# Patient Record
Sex: Female | Born: 1990 | Race: White | Hispanic: No | Marital: Married | Smoking: Former smoker
Health system: Southern US, Community
[De-identification: ages and names within clinical notes are randomized; demographics above are authoritative.]

## PROBLEM LIST (undated history)

## (undated) DIAGNOSIS — K589 Irritable bowel syndrome without diarrhea: Secondary | ICD-10-CM

## (undated) DIAGNOSIS — F329 Major depressive disorder, single episode, unspecified: Secondary | ICD-10-CM

## (undated) DIAGNOSIS — O1490 Unspecified pre-eclampsia, unspecified trimester: Secondary | ICD-10-CM

## (undated) DIAGNOSIS — F419 Anxiety disorder, unspecified: Secondary | ICD-10-CM

## (undated) DIAGNOSIS — G600 Hereditary motor and sensory neuropathy: Secondary | ICD-10-CM

## (undated) DIAGNOSIS — N809 Endometriosis, unspecified: Secondary | ICD-10-CM

## (undated) DIAGNOSIS — G43909 Migraine, unspecified, not intractable, without status migrainosus: Secondary | ICD-10-CM

## (undated) HISTORY — PX: WISDOM TOOTH EXTRACTION: SHX21

## (undated) HISTORY — DX: Endometriosis, unspecified: N80.9

## (undated) HISTORY — PX: CHOLECYSTECTOMY: SHX55

---
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
## 2012-03-05 ENCOUNTER — Emergency Department: Payer: Self-pay | Admitting: Internal Medicine

## 2012-11-30 ENCOUNTER — Emergency Department: Payer: Self-pay | Admitting: Emergency Medicine

## 2012-11-30 LAB — URINALYSIS, COMPLETE
Bilirubin,UR: NEGATIVE
Glucose,UR: NEGATIVE mg/dL (ref 0–75)
Nitrite: NEGATIVE
Ph: 5 (ref 4.5–8.0)
Protein: 30
RBC,UR: 1 /HPF (ref 0–5)
Squamous Epithelial: 2

## 2012-11-30 LAB — COMPREHENSIVE METABOLIC PANEL
Albumin: 4.2 g/dL (ref 3.8–5.6)
Alkaline Phosphatase: 71 U/L — ABNORMAL LOW (ref 82–169)
Anion Gap: 11 (ref 7–16)
BUN: 12 mg/dL (ref 7–18)
Bilirubin,Total: 2.5 mg/dL — ABNORMAL HIGH (ref 0.2–1.0)
Calcium, Total: 9 mg/dL (ref 9.0–10.7)
Chloride: 110 mmol/L — ABNORMAL HIGH (ref 98–107)
Creatinine: 0.56 mg/dL — ABNORMAL LOW (ref 0.60–1.30)
Glucose: 111 mg/dL — ABNORMAL HIGH (ref 65–99)
Osmolality: 280 (ref 275–301)
Potassium: 3.6 mmol/L (ref 3.5–5.1)
SGOT(AST): 26 U/L (ref 0–26)
SGPT (ALT): 34 U/L (ref 12–78)
Sodium: 140 mmol/L (ref 136–145)
Total Protein: 8 g/dL (ref 6.4–8.6)

## 2012-11-30 LAB — LIPASE, BLOOD: Lipase: 160 U/L (ref 73–393)

## 2012-11-30 LAB — CBC
HCT: 42.9 % (ref 35.0–47.0)
MCH: 29.6 pg (ref 26.0–34.0)
MCHC: 34.1 g/dL (ref 32.0–36.0)
Platelet: 221 10*3/uL (ref 150–440)

## 2012-12-08 ENCOUNTER — Ambulatory Visit: Payer: Self-pay | Admitting: Physician Assistant

## 2013-02-02 ENCOUNTER — Ambulatory Visit: Payer: Self-pay | Admitting: Unknown Physician Specialty

## 2013-04-04 ENCOUNTER — Encounter: Payer: Self-pay | Admitting: Neurology

## 2013-04-30 ENCOUNTER — Encounter: Payer: Self-pay | Admitting: Neurology

## 2013-05-30 ENCOUNTER — Encounter: Payer: Self-pay | Admitting: Neurology

## 2013-11-09 ENCOUNTER — Ambulatory Visit: Payer: Self-pay | Admitting: Physician Assistant

## 2014-04-24 IMAGING — CT CT ABD-PELV W/ CM
1 of 2 series · 15 of 32 positions shown, 19 images · IV contrast (isovue)
Comparison: None

REASON FOR EXAM: (1) generalized abdominal pain, vomiting; (2)
generalized abdominal pain, vomiti
COMMENTS:

PROCEDURE:     CT  - CT ABDOMEN / PELVIS  W  - November 30, 2012 [DATE]
RESULT:     History: Generalized abdominal pain
TECHNIQUE: Multiple axial images of the abdomen and pelvis were performed
from the lung bases to the pubic symphysis, without p.o. contrast and with
85 ml of Isovue 300 intravenous contrast.

[Series 2: 3mm soft tissue · axial · 0.58mm/px · z∈[-1156,-754]mm · 15 of 146 slices shown, 19 images]
[im 6/146  soft-tissue]
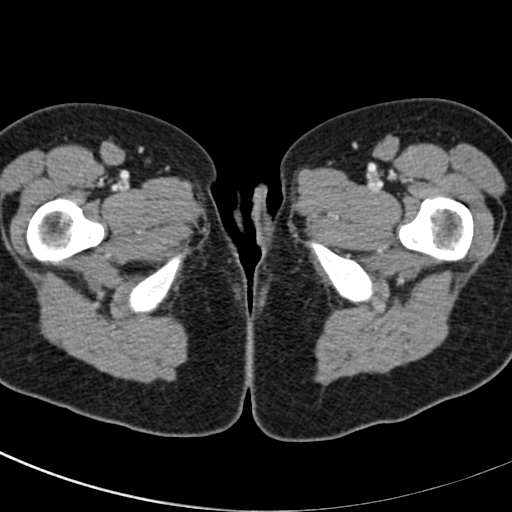
[im 6/146  bone]
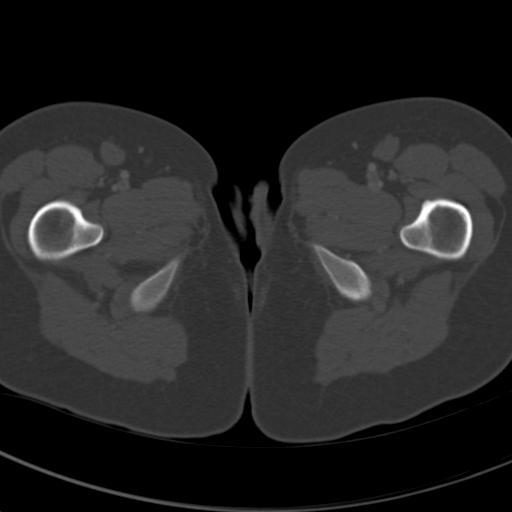
[im 18/146  soft-tissue]
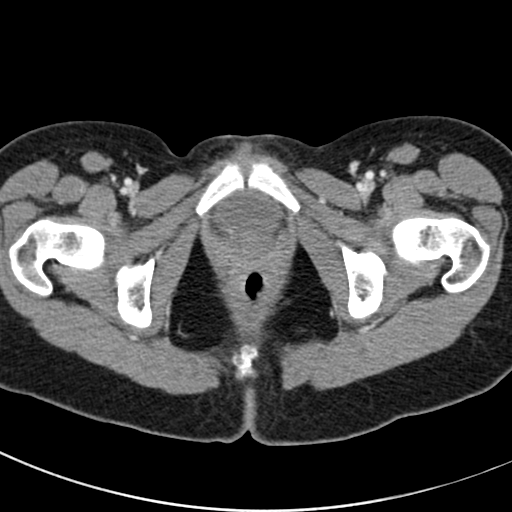
[im 30/146  soft-tissue]
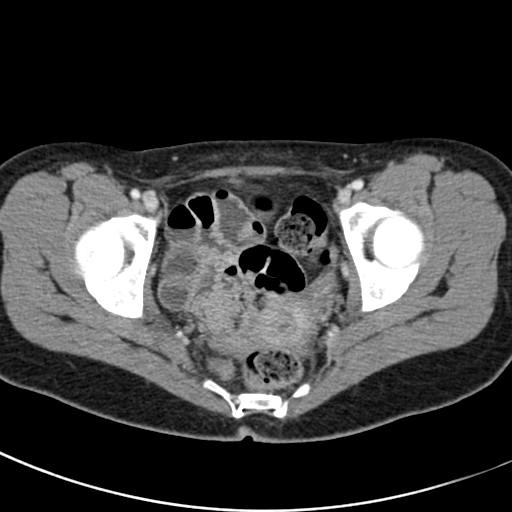
[im 41/146  soft-tissue]
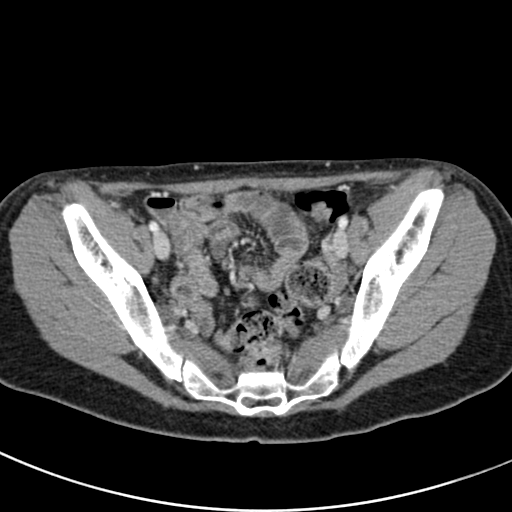
[im 53/146  soft-tissue]
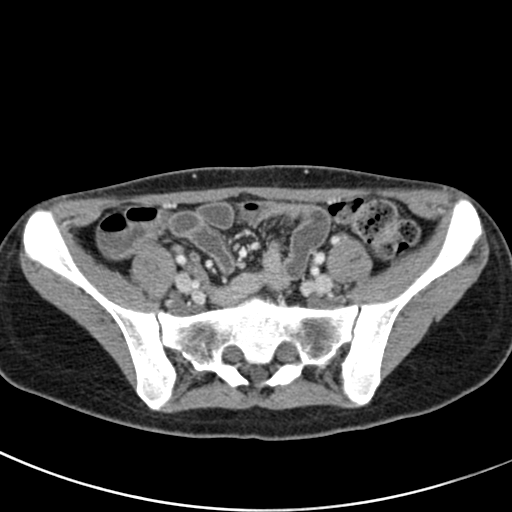
[im 64/146  soft-tissue]
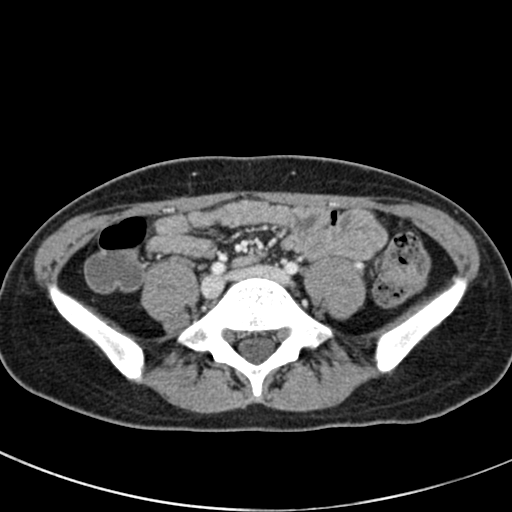
[im 76/146  soft-tissue]
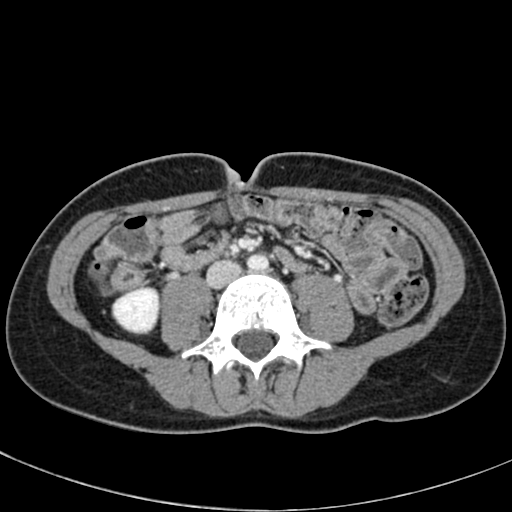
[im 82/146  soft-tissue]
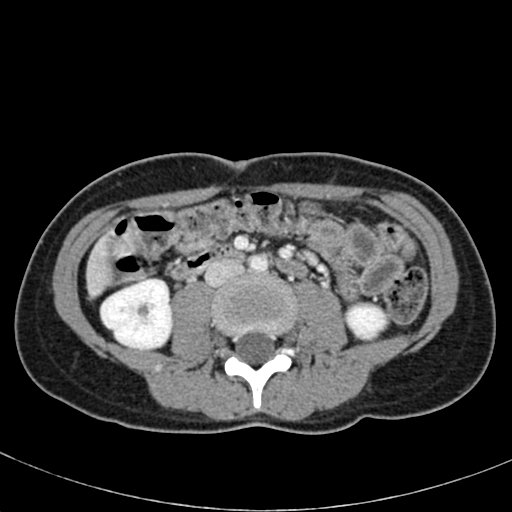
[im 93/146  soft-tissue]
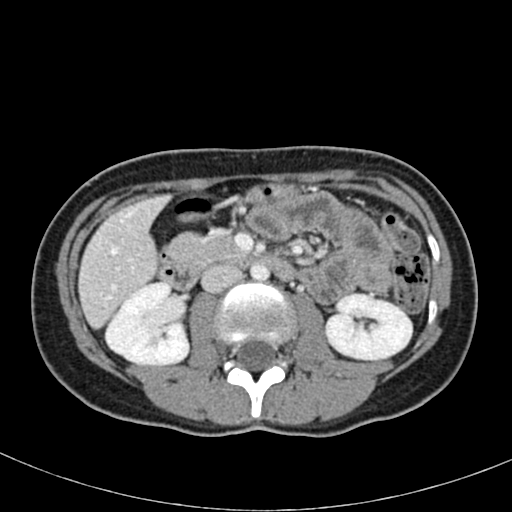
[im 93/146  bone]
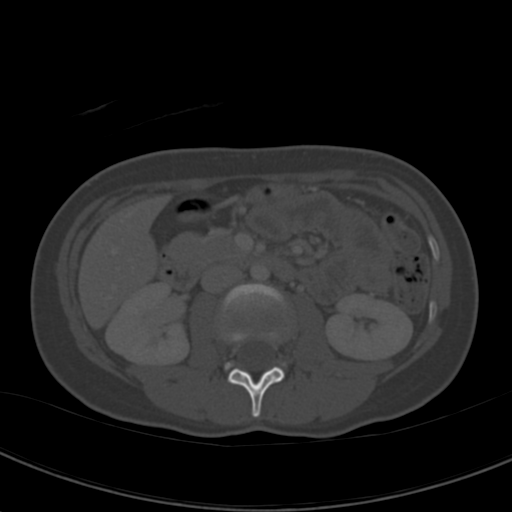
[im 105/146  soft-tissue]
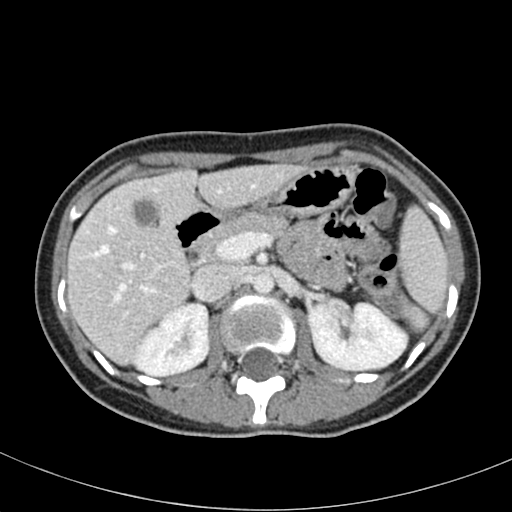
[im 117/146  soft-tissue]
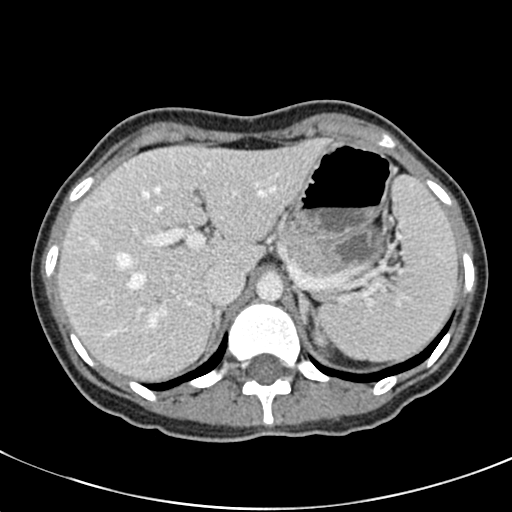
[im 122/146  lung]
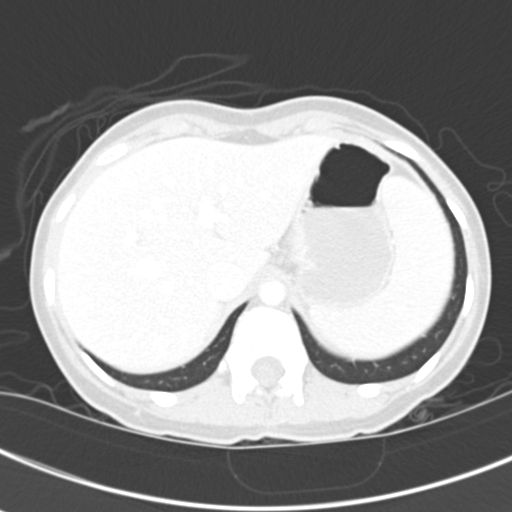
[im 128/146  soft-tissue]
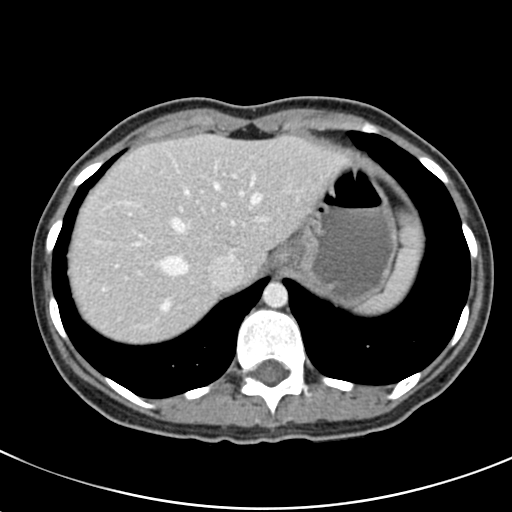
[im 128/146  lung]
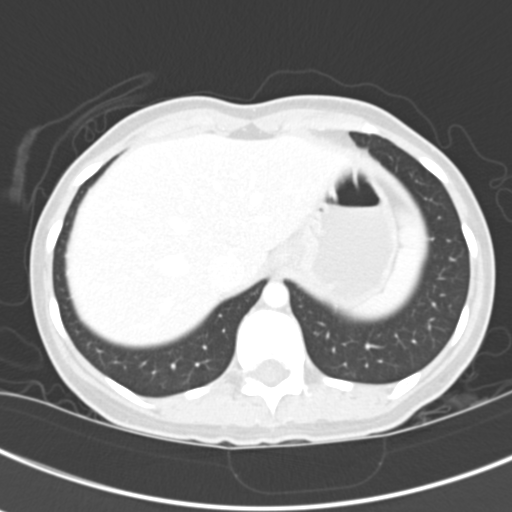
[im 134/146  lung]
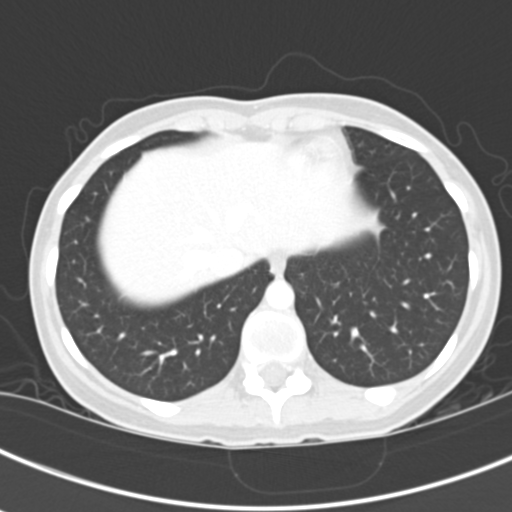
[im 140/146  soft-tissue]
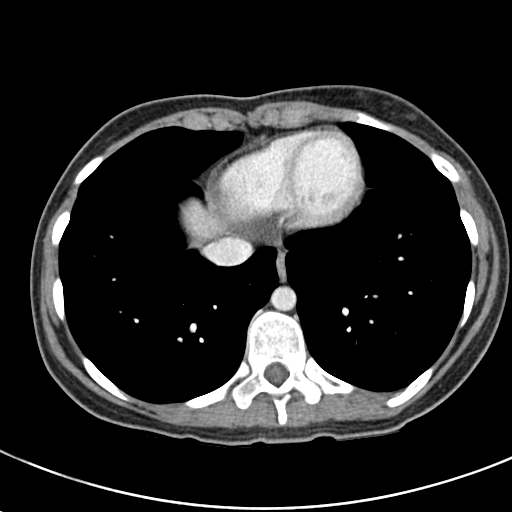
[im 140/146  lung]
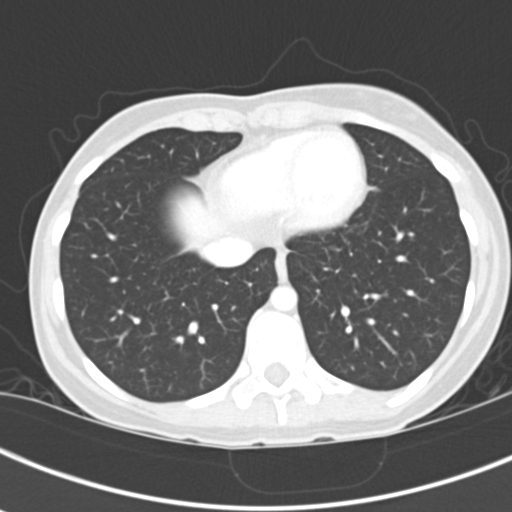

[15 of 32 positions shown; findings below may reference images not displayed]

FINDINGS: The lung bases are clear. There is no pneumothorax. The heart size is
normal.

The liver demonstrates no focal abnormality. There is no intrahepatic or
extrahepatic biliary ductal dilatation. The gallbladder is unremarkable. The
spleen demonstrates no focal abnormality. The kidneys, adrenal glands, and
pancreas are normal. The bladder is unremarkable.

The stomach, duodenum, small intestine, and large intestine demonstrate no
contrast extravasation or dilatation. There is a normal caliber appendix in
the right lower quadrant without periappendiceal inflammatory changes. There
is no pneumoperitoneum, pneumatosis, or portal venous gas. There is no
abdominal or pelvic free fluid. There is no lymphadenopathy.

The abdominal aorta is normal in caliber .

The osseous structures are unremarkable.
IMPRESSION: 1. No acute abdominal or pelvic pathology.

[REDACTED]

## 2014-05-29 DIAGNOSIS — G600 Hereditary motor and sensory neuropathy: Secondary | ICD-10-CM | POA: Insufficient documentation

## 2015-01-07 NOTE — H&P (Signed)
L&D Evaluation:  History Expanded:   HPI 24 yo G2P0010 WF at 6836 6/7 weeks by estimated date of confinement 01/05/12 as evidenced by last menstual period and Ultrasound, with contraction pain and headache.  Patient had elevated BP on admission as well as elevated urine protein level, with subsequent lab work consistent with preclampsia. Patient was late entry to care [facility]    Gravida 2    Term 0    PreTerm 0    Abortion 1    Living 0    Blood Type O positive    Group B Strep Results (Result >5wks must be treated as unknown) unknown/result > 5 weeks ago    Maternal HIV Negative    Maternal Syphilis Ab Nonreactive    Maternal Varicella Immune    Rubella Results immune    Presents with contractions    Patient's Medical History No Chronic Illness    Patient's Surgical History none    Medications Pre Natal Vitamins    Allergies NKDA    Social History none    Family History Non-Contributory   ROS:   ROS All systems were reviewed.  HEENT, CNS, GI, GU, Respiratory, CV, Renal and Musculoskeletal systems were found to be normal.   Exam:   Vital Signs 130s/80s    Urine Protein 3+    General no apparent distress    Mental Status clear    Chest clear    Heart normal sinus rhythm    Abdomen gravid, tender with contractions    Estimated Fetal Weight Average for gestational age    Back no CVAT    Edema no edema    Reflexes 1+    Pelvic no external lesions, 1/80/-1    Mebranes Intact    FHT normal rate with no decels    Ucx irregular    Skin dry    Other U Prot/Cr ratio >5000. Blood work normal, Hgb 14, Plt 199, uric acid 4.0, SGOT 25.   Impression:   Impression Severe Preclampsia, proteinuria and headache   Plan:   Plan Induction of labor    Comments Preclampsia precautions Pitocin Analgesia Risks of prematurity at 36+ weeks vs risks of preclampsia discussed, rationale for induction. MgSO4 due to severe preclampsia   Electronic  Signatures: Letitia LibraHarris, Skyler Dusing Paul (MD)  (Signed 21-Apr-13 02:42)  Authored: L&D Evaluation   Last Updated: 21-Apr-13 02:42 by Letitia LibraHarris, Madiha Bambrick Paul (MD)

## 2015-04-27 ENCOUNTER — Ambulatory Visit
Admission: EM | Admit: 2015-04-27 | Discharge: 2015-04-27 | Disposition: A | Discharge status: Home / Self Care | Payer: Medicaid Other | Attending: Family Medicine | Admitting: Family Medicine

## 2015-04-27 DIAGNOSIS — F17210 Nicotine dependence, cigarettes, uncomplicated: Secondary | ICD-10-CM | POA: Diagnosis not present

## 2015-04-27 DIAGNOSIS — J029 Acute pharyngitis, unspecified: Secondary | ICD-10-CM | POA: Diagnosis not present

## 2015-04-27 HISTORY — DX: Hereditary motor and sensory neuropathy: G60.0

## 2015-04-27 LAB — RAPID STREP SCREEN (MED CTR MEBANE ONLY): Streptococcus, Group A Screen (Direct): NEGATIVE

## 2015-04-27 NOTE — ED Notes (Signed)
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 Patient states cannot wait any longer. Left without seeing provider

## 2015-04-27 NOTE — ED Notes (Signed)
Woke this morning with sore throat."My tongue feels swollen". Now c/o slight headache

## 2015-04-29 LAB — CULTURE, GROUP A STREP (THRC)

## 2015-05-27 NOTE — ED Provider Notes (Signed)
CSN: [id number]     Arrival date & time 04/27/15  1130 History   First MD Initiated Contact with Patient 04/27/15 1226     Chief Complaint  Patient presents with  . Sore Throat   (Consider location/radiation/quality/duration/timing/severity/associated sxs/prior Treatment) HPI Comments: 24 yo female with sore throat since this morning. States tongue feels swollen. Denies any fevers, chills, difficulty breathing or swallowing.  Patient is a 24 y.o. female presenting with pharyngitis. The history is provided by the patient.  Sore Throat    Past Medical History  Diagnosis Date  . Charcot-Marie disease    History reviewed. No pertinent past surgical history. Family History  Problem Relation Age of Onset  . Charcot-Marie-Tooth disease Father   . Charcot-Marie-Tooth disease Sister    Social History  Substance Use Topics  . Smoking status: Current Every Day Smoker -- 0.50 packs/day    Types: Cigarettes  . Smokeless tobacco: None  . Alcohol Use: No   OB History    No data available     Review of Systems  Allergies  Sulfa antibiotics  Home Medications   Prior to Admission medications   Medication Sig Start Date End Date Taking? Authorizing Provider  etonogestrel (NEXPLANON) 68 MG IMPL implant 1 each by Subdermal route once.   Yes Historical Provider, MD   Meds Ordered and Administered this Visit  Medications - No data to display  BP 103/68 mmHg  Pulse 72  Temp(Src) 98.5 F (36.9 C) (Oral)  Resp 16  Ht  (1.6 m)  Wt 135 lb (61.236 kg)  BMI 23.92 kg/m2  SpO2 100%  LMP 02/12/2015 (Approximate) No data found.   Physical Exam  Constitutional: She appears well-developed and well-nourished. No distress.  HENT:  Head: Normocephalic and atraumatic.  Right Ear: Tympanic membrane, external ear and ear canal normal.  Left Ear: Tympanic membrane, external ear and ear canal normal.  Nose: Rhinorrhea present. No nose lacerations, sinus tenderness, nasal deformity,  septal deviation or nasal septal hematoma. No epistaxis.  No foreign bodies.  Mouth/Throat: Uvula is midline and mucous membranes are normal. Posterior oropharyngeal erythema present. No oropharyngeal exudate, posterior oropharyngeal edema or tonsillar abscesses.  Tongue normal; no edema  Eyes: Conjunctivae and EOM are normal. Pupils are equal, round, and reactive to light. Right eye exhibits no discharge. Left eye exhibits no discharge. No scleral icterus.  Neck: Normal range of motion. Neck supple. No thyromegaly present.  Cardiovascular: Normal rate, regular rhythm and normal heart sounds.   Pulmonary/Chest: Effort normal and breath sounds normal. No respiratory distress. She has no wheezes. She has no rales.  Lymphadenopathy:    She has no cervical adenopathy.  Skin: She is not diaphoretic.  Nursing note and vitals reviewed.   ED Course  Procedures (including critical care time)  Labs Review Labs Reviewed  RAPID STREP SCREEN (NOT AT [facility])  CULTURE, GROUP A STREP ([facility] ONLY)    Imaging Review No results found.   Visual Acuity Review  Right Eye Distance:   Left Eye Distance:   Bilateral Distance:    Right Eye Near:   Left Eye Near:    Bilateral Near:         MDM   1. Pharyngitis   (likely viral)  Plan: 1. Test (negative rapid strep)  results and diagnosis reviewed with patient 2. rx as per orders; risks, benefits, potential side effects reviewed with patient 3. Recommend supportive treatment with otc analgesics, salt water gargles 4. F/u prn if symptoms worsen or  don't improve  Discharge Medication List as of 04/27/2015 12:45 PM        Autumn Mccallum, MD 05/27/15 1005

## 2015-11-20 ENCOUNTER — Inpatient Hospital Stay: Admission: RE | Admit: 2015-11-20 | Payer: 59 | Source: Ambulatory Visit

## 2015-12-19 ENCOUNTER — Inpatient Hospital Stay: Admission: RE | Admit: 2015-12-19 | Payer: 59 | Source: Ambulatory Visit

## 2015-12-22 ENCOUNTER — Encounter: Payer: Self-pay | Admitting: *Deleted

## 2015-12-22 NOTE — Patient Instructions (Signed)
  Your procedure is scheduled on: 12-25-15 (THURSDAY) Report to [facility] SAME [facility] 2ND FLOOR To find out your arrival time please call [phone] between 1PM - 3PM on 12-24-15 [facility](WEDNESDAY)  Remember: Instructions that are not followed completely may result in serious medical risk, up to and including death, or upon the discretion of your surgeon and anesthesiologist your surgery may need to be rescheduled.    _X___ 1. Do not eat food or drink liquids after midnight. No gum chewing or hard candies.     _X___ 2. No Alcohol for 24 hours before or after surgery.   ____ 3. Bring all medications with you on the day of surgery if instructed.    _X___ 4. Notify your doctor if there is any change in your medical condition     (cold, fever, infections).     Do not wear jewelry, make-up, hairpins, clips or nail polish.  Do not wear lotions, powders, or perfumes. You may wear deodorant.  Do not shave 48 hours prior to surgery. Men may shave face and neck.  Do not bring valuables to the hospital.    [facility] is not responsible for any belongings or valuables.               Contacts, dentures or bridgework may not be worn into surgery.  Leave your suitcase in the car. After surgery it may be brought to your room.  For patients admitted to the hospital, discharge time is determined by your treatment team.   Patients discharged the day of surgery will not be allowed to drive home.   Please read over the following fact sheets that you were given:      ____ Take these medicines the morning of surgery with A SIP OF WATER:    1. NONE  2.   3.   4.  5.  6.  ____ Fleet Enema (as directed)   _X___ Use CHG Soap as directed  ____ Use inhalers on the day of surgery  ____ Stop metformin 2 days prior to surgery    ____ Take 1/2 of usual insulin dose the night before surgery and none on the morning of surgery.   ____ Stop Coumadin/Plavix/aspirin-N/A  ____ Stop  Anti-inflammatories-NO NSAIDS OR ASPIRIN PRODUCTS-TYLENOL OK TO TAKE   ____ Stop supplements until after surgery.    ____ Bring C-Pap to the hospital.

## 2015-12-23 ENCOUNTER — Encounter
Admission: RE | Admit: 2015-12-23 | Discharge: 2015-12-23 | Disposition: A | Discharge status: Home / Self Care | Payer: Medicaid Other | Source: Ambulatory Visit | Attending: Obstetrics and Gynecology | Admitting: Obstetrics and Gynecology

## 2015-12-23 DIAGNOSIS — Z01812 Encounter for preprocedural laboratory examination: Secondary | ICD-10-CM | POA: Diagnosis not present

## 2015-12-23 LAB — COMPREHENSIVE METABOLIC PANEL
ALT: 20 U/L (ref 14–54)
AST: 22 U/L (ref 15–41)
Albumin: 4.1 g/dL (ref 3.5–5.0)
Alkaline Phosphatase: 38 U/L (ref 38–126)
Anion gap: 8 (ref 5–15)
BUN: 8 mg/dL (ref 6–20)
CALCIUM: 9.1 mg/dL (ref 8.9–10.3)
CO2: 20 mmol/L — ABNORMAL LOW (ref 22–32)
Chloride: 109 mmol/L (ref 101–111)
Creatinine, Ser: 0.6 mg/dL (ref 0.44–1.00)
GFR calc Af Amer: >60 mL/min (ref >60)
GLUCOSE: 105 mg/dL — AB (ref 65–99)
Potassium: 3.6 mmol/L (ref 3.5–5.1)
Sodium: 137 mmol/L (ref 135–145)
TOTAL PROTEIN: 7.8 g/dL (ref 6.5–8.1)
Total Bilirubin: 1.3 mg/dL — ABNORMAL HIGH (ref 0.3–1.2)

## 2015-12-23 LAB — TYPE AND SCREEN
ABO/RH(D): O POS
Antibody Screen: NEGATIVE

## 2015-12-23 LAB — CBC
HCT: 41.6 % (ref 35.0–47.0)
Hemoglobin: 14.2 g/dL (ref 12.0–16.0)
MCH: 29.7 pg (ref 26.0–34.0)
MCHC: 34.2 g/dL (ref 32.0–36.0)
MCV: 87 fL (ref 80.0–100.0)
PLATELETS: 259 10*3/uL (ref 150–440)
RBC: 4.78 MIL/uL (ref 3.80–5.20)
RDW: 13.3 % (ref 11.5–14.5)
WBC: 10.5 10*3/uL (ref 3.6–11.0)

## 2015-12-23 LAB — ABO/RH: ABO/RH(D): O POS

## 2015-12-24 NOTE — Pre-Procedure Instructions (Signed)
Pt came into PAT to get labs drawn per surgeons orders- Pt had told phlebotomist that she could not look at the needle when she was being stuck-Phlebotomist had to stick pt x 2 and was unable to get blood-Phlebotomist came to me and asked if I could try-Upon entering lab, myself and another nurse observed pt slouching to the side of the chair trying to hold onto her water and she was very pale, eyes open, arms shaking slightly.  We reclined pt back in the chair and when calling pts name, she responded and  she said she did not eat this morning.  Cold rag was placed over pts forehead and she stated that she was feeling better and that has never happened to her before. A couple of minutes later,  I raised pt up slightly in the chair but left her legs elevated while I drew her blood.  She tolerated blood draw fine with no problem. I offered pt crackers and she said she was fine and would eat when she left.  I stood beside pt as she got up from the chair and she said she felt fine. I walked her out to the lobby where her boyfriend was and  I told him what happened. I told him that he needed to drive her home and make sure that she ate some food. He verbalized he would do this.  I reviewed pts labs later and her glucose was 105

## 2015-12-25 ENCOUNTER — Ambulatory Visit
Admission: RE | Admit: 2015-12-25 | Discharge: 2015-12-25 | Disposition: A | Discharge status: Home / Self Care | Payer: Medicaid Other | Source: Ambulatory Visit | Attending: Obstetrics and Gynecology | Admitting: Obstetrics and Gynecology

## 2015-12-25 ENCOUNTER — Ambulatory Visit: Payer: Medicaid Other | Admitting: Anesthesiology

## 2015-12-25 ENCOUNTER — Encounter: Payer: Self-pay | Admitting: *Deleted

## 2015-12-25 ENCOUNTER — Encounter
Admission: RE | Disposition: A | Discharge status: Home / Self Care | Payer: Self-pay | Source: Ambulatory Visit | Attending: Obstetrics and Gynecology

## 2015-12-25 DIAGNOSIS — R102 Pelvic and perineal pain: Secondary | ICD-10-CM | POA: Diagnosis present

## 2015-12-25 DIAGNOSIS — Z79899 Other long term (current) drug therapy: Secondary | ICD-10-CM | POA: Insufficient documentation

## 2015-12-25 DIAGNOSIS — Z793 Long term (current) use of hormonal contraceptives: Secondary | ICD-10-CM | POA: Diagnosis not present

## 2015-12-25 DIAGNOSIS — Z833 Family history of diabetes mellitus: Secondary | ICD-10-CM | POA: Diagnosis not present

## 2015-12-25 DIAGNOSIS — G8929 Other chronic pain: Secondary | ICD-10-CM | POA: Insufficient documentation

## 2015-12-25 DIAGNOSIS — G600 Hereditary motor and sensory neuropathy: Secondary | ICD-10-CM | POA: Diagnosis not present

## 2015-12-25 DIAGNOSIS — Z8349 Family history of other endocrine, nutritional and metabolic diseases: Secondary | ICD-10-CM | POA: Diagnosis not present

## 2015-12-25 DIAGNOSIS — Z87891 Personal history of nicotine dependence: Secondary | ICD-10-CM | POA: Diagnosis not present

## 2015-12-25 DIAGNOSIS — Z8249 Family history of ischemic heart disease and other diseases of the circulatory system: Secondary | ICD-10-CM | POA: Insufficient documentation

## 2015-12-25 HISTORY — PX: LAPAROSCOPY: SHX197

## 2015-12-25 LAB — POCT PREGNANCY, URINE: Preg Test, Ur: NEGATIVE

## 2015-12-25 SURGERY — LAPAROSCOPY, DIAGNOSTIC
Anesthesia: General

## 2015-12-25 MED ORDER — ONDANSETRON HCL 4 MG/2ML IJ SOLN
4.0000 mg | Freq: Once | INTRAMUSCULAR | Status: DC | PRN
Start: 2015-12-25 — End: 2015-12-25

## 2015-12-25 MED ORDER — LACTATED RINGERS IV SOLN
INTRAVENOUS | Status: DC
  Administered 2015-12-25 (×3): via INTRAVENOUS

## 2015-12-25 MED ORDER — FENTANYL CITRATE (PF) 100 MCG/2ML IJ SOLN: 25.0000 ug | INTRAMUSCULAR | Status: DC | PRN

## 2015-12-25 MED ORDER — LACTATED RINGERS IV SOLN: INTRAVENOUS | Status: DC

## 2015-12-25 MED ORDER — SUGAMMADEX SODIUM 200 MG/2ML IV SOLN
INTRAVENOUS | Status: DC | PRN
  Administered 2015-12-25: 132.4 mg via INTRAVENOUS

## 2015-12-25 MED ORDER — BUPIVACAINE HCL (PF) 0.5 % IJ SOLN
INTRAMUSCULAR | Status: AC
  Filled 2015-12-25: qty 30

## 2015-12-25 MED ORDER — BUPIVACAINE HCL 0.5 % IJ SOLN
INTRAMUSCULAR | Status: DC | PRN
  Administered 2015-12-25: 3 mL

## 2015-12-25 MED ORDER — FENTANYL CITRATE (PF) 100 MCG/2ML IJ SOLN
25.0000 ug | INTRAMUSCULAR | Status: AC | PRN
  Administered 2015-12-25 (×6): 25 ug via INTRAVENOUS

## 2015-12-25 MED ORDER — FENTANYL CITRATE (PF) 100 MCG/2ML IJ SOLN
INTRAMUSCULAR | Status: AC
  Administered 2015-12-25: 25 ug
  Filled 2015-12-25: qty 2

## 2015-12-25 MED ORDER — PROPOFOL 10 MG/ML IV BOLUS: INTRAVENOUS | Status: DC | PRN

## 2015-12-25 MED ORDER — OXYCODONE HCL 5 MG/5ML PO SOLN: 5.0000 mg | Freq: Once | ORAL | Status: AC | PRN

## 2015-12-25 MED ORDER — PROPOFOL 10 MG/ML IV BOLUS
INTRAVENOUS | Status: DC | PRN
  Administered 2015-12-25: 150 mg via INTRAVENOUS

## 2015-12-25 MED ORDER — HYDROCODONE-ACETAMINOPHEN 5-325 MG PO TABS: 1.0000 | ORAL_TABLET | Freq: Four times a day (QID) | ORAL | Status: DC | PRN

## 2015-12-25 MED ORDER — SUCCINYLCHOLINE CHLORIDE 20 MG/ML IJ SOLN
INTRAMUSCULAR | Status: DC | PRN
  Administered 2015-12-25: 100 mg via INTRAVENOUS

## 2015-12-25 MED ORDER — OXYCODONE HCL 5 MG PO TABS
5.0000 mg | ORAL_TABLET | Freq: Once | ORAL | Status: AC | PRN
  Administered 2015-12-25: 5 mg via ORAL

## 2015-12-25 MED ORDER — FAMOTIDINE 20 MG PO TABS
20.0000 mg | ORAL_TABLET | Freq: Once | ORAL | Status: AC
  Administered 2015-12-25: 20 mg via ORAL

## 2015-12-25 MED ORDER — MIDAZOLAM HCL 2 MG/2ML IJ SOLN
INTRAMUSCULAR | Status: DC | PRN
  Administered 2015-12-25: 2 mg via INTRAVENOUS

## 2015-12-25 MED ORDER — OXYCODONE HCL 5 MG PO TABS
ORAL_TABLET | ORAL | Status: AC
  Filled 2015-12-25: qty 1

## 2015-12-25 MED ORDER — ROCURONIUM BROMIDE 100 MG/10ML IV SOLN
INTRAVENOUS | Status: DC | PRN
  Administered 2015-12-25: 40 mg via INTRAVENOUS
  Administered 2015-12-25: 10 mg via INTRAVENOUS

## 2015-12-25 MED ORDER — FENTANYL CITRATE (PF) 100 MCG/2ML IJ SOLN
INTRAMUSCULAR | Status: DC | PRN
  Administered 2015-12-25 (×3): 50 ug via INTRAVENOUS
  Administered 2015-12-25: 100 ug via INTRAVENOUS

## 2015-12-25 MED ORDER — IBUPROFEN 600 MG PO TABS: 600.0000 mg | ORAL_TABLET | Freq: Four times a day (QID) | ORAL | Status: DC | PRN

## 2015-12-25 MED ORDER — KETOROLAC TROMETHAMINE 30 MG/ML IJ SOLN
INTRAMUSCULAR | Status: DC | PRN
  Administered 2015-12-25: 30 mg via INTRAVENOUS

## 2015-12-25 MED ORDER — DEXAMETHASONE SODIUM PHOSPHATE 10 MG/ML IJ SOLN
INTRAMUSCULAR | Status: DC | PRN
  Administered 2015-12-25: 10 mg via INTRAVENOUS

## 2015-12-25 MED ORDER — ONDANSETRON HCL 4 MG/2ML IJ SOLN
INTRAMUSCULAR | Status: DC | PRN
  Administered 2015-12-25: 4 mg via INTRAVENOUS

## 2015-12-25 MED ORDER — FAMOTIDINE 20 MG PO TABS
ORAL_TABLET | ORAL | Status: AC
  Filled 2015-12-25: qty 1

## 2015-12-25 MED ORDER — FENTANYL CITRATE (PF) 100 MCG/2ML IJ SOLN
INTRAMUSCULAR | Status: AC
  Filled 2015-12-25: qty 2

## 2015-12-25 SURGICAL SUPPLY — 39 items
BLADE SURG SZ11 CARB STEEL (BLADE) ×3 IMPLANT
CANISTER SUCT 1200ML W/VALVE (MISCELLANEOUS) ×3 IMPLANT
CHLORAPREP W/TINT 26ML (MISCELLANEOUS) ×3 IMPLANT
DRAPE LEGGINS SURG 28X43 STRL (DRAPES) ×3 IMPLANT
DRAPE SHEET LG 3/4 BI-LAMINATE (DRAPES) ×3 IMPLANT
DRAPE UNDER BUTTOCK W/FLU (DRAPES) ×3 IMPLANT
DRESSING TELFA 4X3 1S ST N-ADH (GAUZE/BANDAGES/DRESSINGS) ×3 IMPLANT
ELECT REM PT RETURN 9FT ADLT (ELECTROSURGICAL) ×3
ELECTRODE REM PT RTRN 9FT ADLT (ELECTROSURGICAL) ×1 IMPLANT
GLOVE BIO SURGEON STRL SZ7 (GLOVE) ×6 IMPLANT
GLOVE BIOGEL PI IND STRL 7.5 (GLOVE) ×1 IMPLANT
GLOVE BIOGEL PI INDICATOR 7.5 (GLOVE) ×2
GOWN STRL REUS W/ TWL LRG LVL3 (GOWN DISPOSABLE) ×2 IMPLANT
GOWN STRL REUS W/TWL LRG LVL3 (GOWN DISPOSABLE) ×4
IRRIGATION STRYKERFLOW (MISCELLANEOUS) ×1 IMPLANT
IRRIGATOR STRYKERFLOW (MISCELLANEOUS) ×3
IV LACTATED RINGERS 1000ML (IV SOLUTION) ×3 IMPLANT
KIT RM TURNOVER CYSTO AR (KITS) ×3 IMPLANT
LABEL OR SOLS (LABEL) ×3 IMPLANT
LIQUID BAND (GAUZE/BANDAGES/DRESSINGS) ×3 IMPLANT
NDL SAFETY 22GX1.5 (NEEDLE) ×3 IMPLANT
NS IRRIG 500ML POUR BTL (IV SOLUTION) ×3 IMPLANT
PACK LAP CHOLECYSTECTOMY (MISCELLANEOUS) ×3 IMPLANT
PAD OB MATERNITY 4.3X12.25 (PERSONAL CARE ITEMS) ×3 IMPLANT
PAD PREP 24X41 OB/GYN DISP (PERSONAL CARE ITEMS) ×3 IMPLANT
SCISSORS METZENBAUM CVD 33 (INSTRUMENTS) IMPLANT
SHEARS HARMONIC ACE PLUS 36CM (ENDOMECHANICALS) IMPLANT
SLEEVE ENDOPATH XCEL 5M (ENDOMECHANICALS) ×3 IMPLANT
SOL PREP PVP 2OZ (MISCELLANEOUS) ×3
SOLUTION PREP PVP 2OZ (MISCELLANEOUS) ×1 IMPLANT
SURGILUBE 2OZ TUBE FLIPTOP (MISCELLANEOUS) ×3 IMPLANT
SUT MNCRL 4-0 (SUTURE)
SUT MNCRL 4-0 27XMFL (SUTURE)
SUT VIC AB 2-0 UR6 27 (SUTURE) IMPLANT
SUTURE MNCRL 4-0 27XMF (SUTURE) IMPLANT
TROCAR ENDO BLADELESS 11MM (ENDOMECHANICALS) IMPLANT
TROCAR XCEL NON-BLD 5MMX100MML (ENDOMECHANICALS) ×3 IMPLANT
TROCAR XCEL UNIV SLVE 11M 100M (ENDOMECHANICALS) IMPLANT
TUBING INSUFFLATOR HI FLOW (MISCELLANEOUS) ×3 IMPLANT

## 2015-12-25 NOTE — Anesthesia Procedure Notes (Signed)
Procedure Name: Intubation Date/Time: 12/25/2015 3:08 PM Performed by: Junious SilkNOLES, Cardelia Sassano Pre-anesthesia Checklist: Patient identified, Patient being monitored, Timeout performed, Emergency Drugs available and Suction available Patient Re-evaluated:Patient Re-evaluated prior to inductionOxygen Delivery Method: Circle system utilized Preoxygenation: Pre-oxygenation with 100% oxygen Intubation Type: IV induction Ventilation: Mask ventilation without difficulty Laryngoscope Size: Mac and 3 Grade View: Grade I Tube type: Oral Tube size: 7.0 mm Number of attempts: 1 Airway Equipment and Method: Stylet Placement Confirmation: ETT inserted through vocal cords under direct vision,  positive ETCO2 and breath sounds checked- equal and bilateral Secured at: 21 cm Tube secured with: Tape Dental Injury: Teeth and Oropharynx as per pre-operative assessment

## 2015-12-25 NOTE — Discharge Instructions (Signed)
AMBULATORY SURGERY  °DISCHARGE INSTRUCTIONS ° ° °1) The drugs that you were given will stay in your system until tomorrow so for the next 24 hours you should not: ° °A) Drive an automobile °B) Make any legal decisions °C) Drink any alcoholic beverage ° ° °2) You may resume regular meals tomorrow.  Today it is better to start with liquids and gradually work up to solid foods. ° °You may eat anything you prefer, but it is better to start with liquids, then soup and crackers, and gradually work up to solid foods. ° ° °3) Please notify your doctor immediately if you have any unusual bleeding, trouble breathing, redness and pain at the surgery site, drainage, fever, or pain not relieved by medication. ° ° ° °4) Additional Instructions: ° ° ° ° ° ° ° °Please contact your physician with any problems or Same Day Surgery at [phone], Monday through Friday 6 am to 4 pm, or [place] at  Main number at [phone]. ° °Diagnostic Laparoscopy °A diagnostic laparoscopy is a procedure to diagnose diseases in the abdomen. During the procedure, a thin, lighted, pencil-sized instrument called a laparoscope is inserted into the abdomen through an incision. The laparoscope allows your health care provider to look at the organs inside your body. °LET YOUR HEALTH CARE PROVIDER KNOW ABOUT: °· Any allergies you have. °· All medicines you are taking, including vitamins, herbs, eye drops, creams, and over-the-counter medicines. °· Previous problems you or members of your family have had with the use of anesthetics. °· Any blood disorders you have. °· Previous surgeries you have had. °· Medical conditions you have. °RISKS AND COMPLICATIONS  °Generally, this is a safe procedure. However, problems can occur, which may include: °· Infection. °· Bleeding. °· Damage to other organs. °· Allergic reaction to the anesthetics used during the procedure. °BEFORE THE PROCEDURE °· Do not eat or drink anything after midnight on the night  before the procedure or as directed by your health care provider. °· Ask your health care provider about: °¨ Changing or stopping your regular medicines. °¨ Taking medicines such as aspirin and ibuprofen. These medicines can thin your blood. Do not take these medicines before your procedure if your health care provider instructs you not to. °· Plan to have someone take you home after the procedure. °PROCEDURE °· You may be given a medicine to help you relax (sedative). °· You will be given a medicine to make you sleep (general anesthetic). °· Your abdomen will be inflated with a gas. This will make your organs easier to see. °· Small incisions will be made in your abdomen. °· A laparoscope and other small instruments will be inserted into the abdomen through the incisions. °· A tissue sample may be removed from an organ in the abdomen for examination. °· The instruments will be removed from the abdomen. °· The gas will be released. °· The incisions will be closed with stitches (sutures). °AFTER THE PROCEDURE  °Your blood pressure, heart rate, breathing rate, and blood oxygen level will be monitored often until the medicines you were given have worn off. °  °This information is not intended to replace advice given to you by your health care provider. Make sure you discuss any questions you have with your health care provider. °  °Document Released: 11/22/2000 Document Revised: 05/07/2015 Document Reviewed: 03/29/2014 °Elsevier Interactive Patient Education ©2016 [employer] ° °

## 2015-12-25 NOTE — Anesthesia Postprocedure Evaluation (Signed)
Anesthesia Post Note  Patient: Autumn Davies  Procedure(s) Performed: Procedure(s) (LRB): LAPAROSCOPY DIAGNOSTIC (N/A)  Patient location during evaluation: PACU Anesthesia Type: General Level of consciousness: awake and alert Pain management: pain level controlled Vital Signs Assessment: post-procedure vital signs reviewed and stable Respiratory status: spontaneous breathing, nonlabored ventilation, respiratory function stable and patient connected to nasal cannula oxygen Cardiovascular status: blood pressure returned to baseline and stable Postop Assessment: no signs of nausea or vomiting Anesthetic complications: no    Last Vitals:  Filed Vitals:   12/25/15 1650 12/25/15 1655  BP:  122/79  Pulse: 72 88  Temp:    Resp: 13 12    Last Pain:  Filed Vitals:   12/25/15 1655  PainSc: 2                  Cleda MccreedyJoseph K Piscitello

## 2015-12-25 NOTE — Anesthesia Preprocedure Evaluation (Signed)
Anesthesia Evaluation  Patient identified by MRN, date of birth, ID band Patient awake    Reviewed: Allergy & Precautions, H&P , NPO status , Patient's Chart, lab work & pertinent test results  History of Anesthesia Complications Negative for: history of anesthetic complications  Airway Mallampati: II  TM Distance: >3 FB Neck ROM: full    Dental  (+) Poor Dentition, Chipped   Pulmonary neg shortness of breath, Current Smoker,    Pulmonary exam normal breath sounds clear to auscultation       Cardiovascular Exercise Tolerance: Good (-) angina(-) Past MI negative cardio ROS Normal cardiovascular exam Rhythm:regular Rate:Normal     Neuro/Psych  Neuromuscular disease negative psych ROS   GI/Hepatic negative GI ROS, Neg liver ROS, neg GERD  ,  Endo/Other  negative endocrine ROS  Renal/GU negative Renal ROS  negative genitourinary   Musculoskeletal negative musculoskeletal ROS (+)   Abdominal   Peds negative pediatric ROS (+)  Hematology negative hematology ROS (+)   Anesthesia Other Findings Past Medical History:   Charcot-Marie disease                                          Comment:AFFECTS MOSTLY HER LEGS-CAUSES CRAMPS AND               RESTLESS LEG-TAKES GABAPENTIN AT NIGHT WHICH               SEEMS TO HELP  Past Surgical History:   NO PAST SURGERIES                                            BMI    Body Mass Index   25.86 kg/m 2      Reproductive/Obstetrics negative OB ROS                             Anesthesia Physical Anesthesia Plan  ASA: III  Anesthesia Plan: General ETT   Post-op Pain Management:    Induction:   Airway Management Planned:   Additional Equipment:   Intra-op Plan:   Post-operative Plan:   Informed Consent: I have reviewed the patients History and Physical, chart, labs and discussed the procedure including the risks, benefits and alternatives  for the proposed anesthesia with the patient or authorized representative who has indicated his/her understanding and acceptance.   Dental Advisory Given  Plan Discussed with: Anesthesiologist, CRNA and Surgeon  Anesthesia Plan Comments:         Anesthesia Quick Evaluation

## 2015-12-25 NOTE — H&P (Signed)
History and Physical Interval Note:  Autumn Davies  has presented today for surgery, with the diagnosis of CHRONIC PELVIC PAIN  The various methods of treatment have been discussed with the patient and family. After consideration of risks, benefits and other options for treatment, the patient has consented to  Procedure(s): LAPAROSCOPY DIAGNOSTIC (N/A) as a surgical intervention .  The patient's history has been reviewed, patient examined, no change in status, stable for surgery.  I have reviewed the patient's chart and labs.  Questions were answered to the patient's satisfaction.  The consent has been reviewed and the surgery reviewed in detail, including risks/benefits.  The patient wishes to proceed.  The patient does not take a beta blocker and one is not indicated for this surgery.  Conard NovakJackson, Juanya Villavicencio D, MD 12/25/2015 2:23 PM

## 2015-12-25 NOTE — Op Note (Signed)
Operative Note Diagnostic Laparoscopy  Pre-Op Diagnosis: chronic pelvic pain  Post-Op Diagnosis: chronic pelvic pain  Procedures:  Diagnostic laparoscopy  Primary Surgeon: Thomasene MohairStephen Michaelanthony Kempton, MD   EBL: 3 ml   IVF: 600 mL  Urine output: 50 mL clear urine at end of procedure  Specimens: biopsy of posterior cul-de-sac peritoneum  Drains: none  Complications: None   Disposition: PACU   Condition: Stable   Findings:  1) no evidence of endometriosis 2) normal-appearing uterus, fallopian tubes, ovaries, and cervix 3) normal-appearing appendix  Procedure Summary:  The patient was taken to the operating room where general anesthesia was administered and found to be adequate. She was placed in the dorsal supine lithotomy position in [place] stirrups and prepped and draped in usual sterile fashion. After a timeout was called an indwelling catheter was placed in her bladder. A sponge on a stick was inserted into the vagina for uterine manipulation.   Attention was turned to the abdomen where a 5 mm infraumbilical incision was made with the scalpel. Entry into the abdomen was obtained via Optiview trocar technique (a blunt entry technique with camera visualization through the obturator upon entry). Verification of entry into the abdomen was obtained using opening pressures. The abdomen was insufflated with CO2. The camera was introduced through the trocar with verification of atraumatic entry.  A second trocar was placed approximately 3cm cephalad to the pubic bone under direct intra-abdominal camera visualization of a 5mm port.    A survey of the pelvis and abdominal cavity was undertaken with the above-noted findings.  Using a biopsy forcep, a posterior cul-de-sac peritoneum biopsy was utilized to take a small biopsy in the most dependant portion of the pelvis. Care was taken to avoid bowel.  After removal of the specimen, hemostasis was verified at the biopsy site.    This completed the  procedure.  The abdomen was emptied of CO2 using five deep breaths from anesthesia.  The trocars were removed.  The skin was closed with surgical skin glue.  The sponge stick and sponge were removed from the vagina and the catheter was removed.  The vagina was checked to ensure no surgical instruments or sponges were left.   Sponge, lap, needle, and instrument counts were correct x 2.  VTE prophylaxis: SCDs. Antibiotic prophylaxis: none indicated and none give. The patient tolerated the procedure well and was taken to the PACU in stable condition.   Thomasene MohairStephen Twain Stenseth, MD 12/25/2015 3:45 PM

## 2015-12-25 NOTE — Transfer of Care (Signed)
Immediate Anesthesia Transfer of Care Note  Patient: Autumn Davies  Procedure(s) Performed: Procedure(s): LAPAROSCOPY DIAGNOSTIC (N/A)  Patient Location: PACU  Anesthesia Type:General  Level of Consciousness: sedated  Airway & Oxygen Therapy: Patient Spontanous Breathing and Patient connected to face mask oxygen  Post-op Assessment: Report given to RN and Post -op Vital signs reviewed and stable  Post vital signs: Reviewed and stable  Last Vitals:  Filed Vitals:   12/25/15 1347 12/25/15 1555  BP: 115/70   Pulse: 80   Temp: 36.6 C 36.4 C  Resp: 16     Last Pain: There were no vitals filed for this visit.       Complications: No apparent anesthesia complications

## 2015-12-26 ENCOUNTER — Encounter: Payer: Self-pay | Admitting: Obstetrics and Gynecology

## 2015-12-29 LAB — SURGICAL PATHOLOGY

## 2016-10-15 ENCOUNTER — Other Ambulatory Visit: Payer: Self-pay | Admitting: Neurology

## 2016-10-15 DIAGNOSIS — R413 Other amnesia: Secondary | ICD-10-CM

## 2016-10-26 ENCOUNTER — Ambulatory Visit
Admission: RE | Admit: 2016-10-26 | Discharge: 2016-10-26 | Disposition: A | Discharge status: Home / Self Care | Payer: Medicaid Other | Source: Ambulatory Visit | Attending: Neurology | Admitting: Neurology

## 2016-10-26 DIAGNOSIS — R51 Headache: Secondary | ICD-10-CM | POA: Insufficient documentation

## 2016-10-26 DIAGNOSIS — R413 Other amnesia: Secondary | ICD-10-CM | POA: Insufficient documentation

## 2016-12-08 ENCOUNTER — Encounter: Payer: Self-pay | Admitting: Obstetrics and Gynecology

## 2016-12-08 ENCOUNTER — Ambulatory Visit (INDEPENDENT_AMBULATORY_CARE_PROVIDER_SITE_OTHER): Payer: Medicaid Other | Admitting: Obstetrics and Gynecology

## 2016-12-08 VITALS — BP 100/60 | HR 61 | Ht 64.0 in | Wt 148.0 lb

## 2016-12-08 DIAGNOSIS — N809 Endometriosis, unspecified: Secondary | ICD-10-CM | POA: Diagnosis not present

## 2016-12-08 DIAGNOSIS — R102 Pelvic and perineal pain: Secondary | ICD-10-CM | POA: Diagnosis not present

## 2016-12-08 NOTE — Progress Notes (Signed)
Obstetrics & Gynecology Office Visit   Chief Complaint  Patient presents with  . Follow-up    pelvic pain, endometriosis   History of Present Illness: 26 y.o. G62P0101 female with a history of laparoscopy-diagnosed endometriosis, who presents in follow up for control of pelvic pain.  She was seen last in November.  At that time her medication was changed from Sprintec to Lo loestrin Fe.  Since her last visit she has done well on lo loestrin with minimal bleeding and minimal pain. She is quite satisfied with her current pain control regimen.  She has had no side effects from this medication.   Review of Systems: Review of Systems  Constitutional: Negative.   HENT: Negative.   Eyes: Negative.   Respiratory: Negative.   Cardiovascular: Negative.   Gastrointestinal: Negative.   Genitourinary: Negative.   Musculoskeletal: Negative.   Skin: Negative.   Neurological: Negative.   Psychiatric/Behavioral: Negative.     Past Medical History:  Diagnosis Date  . 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 Endometriosis determined by laparoscopy     Past Surgical History:  Procedure Laterality Date  . LAPAROSCOPY N/A 12/25/2015   Procedure: LAPAROSCOPY DIAGNOSTIC;  Surgeon: Conard Novak, MD;  Location: [facility] ORS;  Service: Gynecology;  Laterality: N/A;    Gynecologic History: No LMP recorded. Patient has had an implant.  Obstetric History: G1P0101   Family History  Problem Relation Age of Onset  . Charcot-Marie-Tooth disease Father   . Charcot-Marie-Tooth disease Sister     Social History   Social History  . Marital status: Single    Spouse name: N/A  . Number of children: N/A  . Years of education: N/A   Occupational History  . Not on file.   Social History Main Topics  . Smoking status: Current Every Day Smoker    Packs/day: 0.50    Types: Cigarettes  . Smokeless tobacco:  Never Used  . Alcohol use No  . Drug use: No  . Sexual activity: Yes    Birth control/ protection: Pill   Other Topics Concern  . Not on file   Social History Narrative  . No narrative on file    Allergies  Allergen Reactions  . Sulfa Antibiotics Rash    Medications:   Medication Sig Dispense Refill  . etonogestrel (NEXPLANON) 68 MG IMPL implant 1 each by Subdermal route once.    . gabapentin (NEURONTIN) 100 MG capsule Take 100 mg by mouth at bedtime.    Lo lo estrin Fe No facility-administered medications prior to visit.       Physical Exam BP 100/60   Pulse 61   Ht  (1.626 m)   Wt 148 lb (67.1 kg)   BMI 25.40 kg/m   No LMP recorded. Patient has had an implant.  General: NAD HEENT: normocephalic, anicteric Pulmonary: No increased work of breathing Extremities: no edema, erythema, or tenderness Neurologic: Grossly intact Psychiatric: mood appropriate, affect full   Assessment: 26 y.o. G1P0101 here for follow up for pelvic pain from endometriosis, much improved from last visit.   Plan: Continue current plan of management with lo loestrin fe.  Samples provided.    Recommend routine gynecologic exam as she has never had a pap smear.   Arrange this appointment when possible.  15 minutes spent in face to face discussion with > 50% spent in counseling and management of her  chronic pelvic pain due to endometriosis.   Thomasene Mohair, MD 12/08/2016 1:09 PM

## 2017-01-31 ENCOUNTER — Telehealth: Payer: Self-pay

## 2017-01-31 DIAGNOSIS — N809 Endometriosis, unspecified: Secondary | ICD-10-CM

## 2017-01-31 MED ORDER — NORETHIN-ETH ESTRAD-FE BIPHAS 1 MG-10 MCG / 10 MCG PO TABS: 1.0000 | ORAL_TABLET | Freq: Every day | ORAL | 0 refills | Status: DC

## 2017-01-31 NOTE — Telephone Encounter (Signed)
Pt calling stating Lo Loestrin still not showing that she can get refills.  What to do?  [phone]

## 2017-01-31 NOTE — Telephone Encounter (Signed)
[facility] for 633-month [facility]

## 2017-01-31 NOTE — Telephone Encounter (Signed)
Please advise. Pt has an annual scheduled with you on 6/18

## 2017-02-01 NOTE — Telephone Encounter (Signed)
Pt aware, via voicemail, of medication being sent to pharmacy 

## 2017-02-14 ENCOUNTER — Encounter: Payer: Self-pay | Admitting: Obstetrics and Gynecology

## 2017-02-14 ENCOUNTER — Ambulatory Visit (INDEPENDENT_AMBULATORY_CARE_PROVIDER_SITE_OTHER): Payer: Medicaid Other | Admitting: Obstetrics and Gynecology

## 2017-02-14 VITALS — BP 122/70 | Ht 64.0 in | Wt 152.0 lb

## 2017-02-14 DIAGNOSIS — R102 Pelvic and perineal pain: Secondary | ICD-10-CM

## 2017-02-14 DIAGNOSIS — Z01419 Encounter for gynecological examination (general) (routine) without abnormal findings: Secondary | ICD-10-CM | POA: Diagnosis not present

## 2017-02-14 DIAGNOSIS — Z113 Encounter for screening for infections with a predominantly sexual mode of transmission: Secondary | ICD-10-CM | POA: Diagnosis not present

## 2017-02-14 DIAGNOSIS — Z1339 Encounter for screening examination for other mental health and behavioral disorders: Secondary | ICD-10-CM

## 2017-02-14 DIAGNOSIS — Z124 Encounter for screening for malignant neoplasm of cervix: Secondary | ICD-10-CM | POA: Diagnosis not present

## 2017-02-14 DIAGNOSIS — Z1389 Encounter for screening for other disorder: Secondary | ICD-10-CM

## 2017-02-14 DIAGNOSIS — Z0000 Encounter for general adult medical examination without abnormal findings: Secondary | ICD-10-CM | POA: Diagnosis not present

## 2017-02-14 LAB — HM PAP SMEAR: HM Pap smear: NORMAL

## 2017-02-14 NOTE — Progress Notes (Signed)
Gynecology Annual Exam  PCP: Titus MouldWhite, Lorine Burney, NP  Chief Complaint  Patient presents with  . Annual Exam    History of Present Illness:  Ms. Autumn Davies is a 26 y.o. G2P1011 who LMP was No LMP recorded. Patient has had an implant., presents today for her annual examination.  Her menses are rare due to Nexplanon and lo loestrin.   She is single partner, contraception - Nexplanon. It was placed two years ago.  Last Pap: does not remember Hx of STDs: none  There is no FH of breast cancer. There is no FH of ovarian cancer. The patient does not do self-breast exams.  Tobacco use: The patient denies current or previous tobacco use. Alcohol use: social drinker Exercise: moderately active  The patient wears seatbelts: yes.   The patient reports that domestic violence in her life is absent.   Continues to do well on lo loestrin for pain.  No side effects of the medication.   Past Medical History:  Diagnosis Date  . 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 Endometriosis determined by laparoscopy     Past Surgical History:  Procedure Laterality Date  . LAPAROSCOPY N/A 12/25/2015   Procedure: LAPAROSCOPY DIAGNOSTIC;  Surgeon: Conard NovakStephen D Jackson, MD;  Location: [facility] OR[facility];  Service: Gynecology;  Laterality: N/A;    Prior to Admission medications   Medication Sig Start Date End Date Taking? Authorizing Provider  Ascorbic Acid (VITAMIN C) 100 MG tablet Take by mouth.   Yes [provider]  etonogestrel (NEXPLANON) 68 MG IMPL implant 1 each by Subdermal route once.   Yes [provider]  gabapentin (NEURONTIN) 100 MG capsule Take 100 mg by mouth at bedtime.   Yes [provider]  Norethindrone-Ethinyl Estradiol-Fe Biphas (LO LOESTRIN FE) 1 MG-10 MCG / 10 MCG tablet Take 1 tablet by mouth daily. 01/31/17 04/25/17 Yes Conard NovakJackson, Stephen D, MD  traZODone (DESYREL) 50 MG  tablet TAKE 1 TABLET(50 MG) BY MOUTH EVERY NIGHT 12/03/16 09/24/17 Yes [provider]    Allergies  Allergen Reactions  . Citalopram   . Sulfa Antibiotics Rash    Gynecologic History: No LMP recorded. Patient has had an implant. History of abnormal pap smear: No History of STI: No   Obstetric History: G2P1011  Social History   Social History  . Marital status: Single    Spouse name: N/A  . Number of children: N/A  . Years of education: N/A   Occupational History  . Not on file.   Social History Main Topics  . Smoking status: Current Every Day Smoker    Packs/day: 0.50    Types: Cigarettes  . Smokeless tobacco: Never Used  . Alcohol use No  . Drug use: No  . Sexual activity: Yes    Birth control/ protection: Pill   Other Topics Concern  . Not on file   Social History Narrative  . No narrative on file    Family History  Problem Relation Age of Onset  . Charcot-Marie-Tooth disease Father   . Charcot-Marie-Tooth disease Sister   . Heart disease Maternal Grandmother   . Hyperlipidemia Maternal Grandmother   . Heart disease Maternal Grandfather     Review of Systems  Constitutional: Negative.   HENT: Negative.   Eyes: Negative.   Respiratory: Negative.   Cardiovascular: Negative.   Gastrointestinal: Negative.   Genitourinary: Negative.   Musculoskeletal: Negative.   Skin:  Negative.   Neurological: Negative.   Psychiatric/Behavioral: Negative.      Physical Exam BP 122/70   Ht 5\' 4"  (1.626 m)   Wt 152 lb (68.9 kg)   BMI 26.09 kg/m    Physical Exam  Constitutional: She is oriented to person, place, and time. She appears well-developed and well-nourished. No distress.  Genitourinary: Vagina normal and uterus normal. Pelvic exam was performed with patient supine. There is no rash, tenderness, lesion or injury on the right labia. There is no rash, tenderness, lesion or injury on the left labia. Vagina exhibits no lesion. No bleeding in the  vagina. No signs of injury around the vagina. No vaginal discharge found. Right adnexum does not display mass, does not display tenderness and does not display fullness. Left adnexum does not display mass, does not display tenderness and does not display fullness. Cervix does not exhibit motion tenderness, lesion, polyp or nabothian cyst.   Uterus is mobile and retroverted. Uterus is not enlarged, tender or exhibiting a mass.  HENT:  Head: Normocephalic and atraumatic.  Eyes: EOM are normal. No scleral icterus.  Neck: Normal range of motion. Neck supple. No thyromegaly present.  Cardiovascular: Normal rate, regular rhythm and normal heart sounds.  Exam reveals no gallop and no friction rub.   No murmur heard. Pulmonary/Chest: Effort normal and breath sounds normal. No respiratory distress. She has no wheezes. She has no rales.  Abdominal: Soft. Bowel sounds are normal. She exhibits no distension and no mass. There is no tenderness. There is no rebound and no guarding.  Musculoskeletal: Normal range of motion. She exhibits no edema.  Lymphadenopathy:    She has no cervical adenopathy.  Neurological: She is alert and oriented to person, place, and time. No cranial nerve deficit.  Skin: Skin is warm and dry. No erythema.  Psychiatric: She has a normal mood and affect. Her behavior is normal. Judgment normal.    Female chaperone present for pelvic and breast  portions of the physical exam  Results: AUDIT Questionnaire (screen for alcoholism): 3 PHQ-9: 3   Assessment: 26 y.o. G50P1011 female here for routine annual gynecologic examination  Plan: Problem List Items Addressed This Visit    Pelvic pain in female    Other Visit Diagnoses    Women's annual routine gynecological examination    -  Primary   Relevant Orders   IGP, CtNg, rfx Aptima HPV ASCU   Pap smear for cervical cancer screening       Relevant Orders   IGP, CtNg, rfx Aptima HPV ASCU   Screen for STD (sexually transmitted  disease)       Relevant Orders   IGP, CtNg, rfx Aptima HPV ASCU   Screening for alcohol problem          Screening: -- Blood pressure screen normal -- Weight screening: normal -- Depression screening negative (PHQ-9) -- Nutrition: normal -- cholesterol screening: not due for screening -- osteoporosis screening: not due -- tobacco screening: not using -- alcohol screening: AUDIT questionnaire indicates low-risk usage. -- family history of breast cancer screening: done. not at high risk. -- no evidence of domestic violence or intimate partner violence. -- STD screening: gonorrhea/chlamydia NAAT collected -- pap smear collected per ASCCP guidelines -- HPV vaccination series: has not received. Discussed. She will consider.   Thomasene Mohair, MD 02/14/2017 2:07 PM

## 2017-02-16 LAB — IGP, CTNG, RFX APTIMA HPV ASCU
CHLAMYDIA, NUC. ACID AMP: NEGATIVE
GONOCOCCUS BY NUCLEIC ACID AMP: NEGATIVE
PAP SMEAR COMMENT: 0

## 2017-02-17 ENCOUNTER — Encounter: Payer: Self-pay | Admitting: Obstetrics and Gynecology

## 2017-06-03 ENCOUNTER — Encounter: Payer: Self-pay | Admitting: *Deleted

## 2017-06-03 ENCOUNTER — Ambulatory Visit
Admission: EM | Admit: 2017-06-03 | Discharge: 2017-06-03 | Disposition: A | Discharge status: Home / Self Care | Payer: Self-pay | Attending: Family Medicine | Admitting: Family Medicine

## 2017-06-03 DIAGNOSIS — L03114 Cellulitis of left upper limb: Secondary | ICD-10-CM

## 2017-06-03 DIAGNOSIS — L02414 Cutaneous abscess of left upper limb: Secondary | ICD-10-CM

## 2017-06-03 MED ORDER — DOXYCYCLINE HYCLATE 100 MG PO CAPS
100.0000 mg | ORAL_CAPSULE | Freq: Two times a day (BID) | ORAL | 0 refills | Status: DC
Start: 2017-06-03 — End: 2019-02-23

## 2017-06-03 MED ORDER — MUPIROCIN 2 % EX OINT: TOPICAL_OINTMENT | CUTANEOUS | 0 refills | Status: DC

## 2017-06-03 MED ORDER — TETANUS-DIPHTH-ACELL PERTUSSIS 5-2.5-18.5 LF-MCG/0.5 IM SUSP
0.5000 mL | Freq: Once | INTRAMUSCULAR | Status: AC
Start: 2017-06-03 — End: 2017-06-03
  Administered 2017-06-03: 0.5 mL via INTRAMUSCULAR

## 2017-06-03 NOTE — ED Triage Notes (Signed)
Patient was possibly bitten by a insect 1 week ago. The bite is located on her left anterior forearm. The bite is red swollen, and hard.

## 2017-06-03 NOTE — Discharge Instructions (Signed)
Take medication as prescribed. Keep clean. Monitor closely. Drink plenty of fluids.   Follow up with your primary care physician this week as needed. Return to Urgent care for new or worsening concerns.

## 2017-06-03 NOTE — ED Provider Notes (Signed)
[facility] ____________________________________________  Time seen: Approximately 1:47 PM  I have reviewed the triage vital signs and the nursing notes.   HISTORY  Chief Complaint Skin Problem   HPI Autumn Davies is a 26 y.o. female present for evaluation of red area to left forearm after what she believes was bitten by an insect. Patient states this past Friday or Saturday she noticed a very small red dot to her left forearm, and states that she did not think much about it. Patient reports a the last several days the areas continued to increase in size as well as with surrounding redness. Denies known insect bite. Denies tick bite or tick attachment. Reports the area is slightly tender to touch, left arm otherwise nontender and no other swelling. States last night she and spouse tried to express some drainage, and states that they were able to squeeze really hurting get minimal drainage out, no other drainage. Denies any paresthesias, decreased range of motion, other skin changes. Reports continues to eat and drink well. Denies known fevers. Denies recent sickness. Reports otherwise feels well. the counter medications taken for the same complaints. denies aggravating or alleviating factors.  Denies chest pain, shortness of breath, abdominal pain, dysuria. Denies recent sickness. Denies recent antibiotic use.   White, Arlyss Repress, NP: PCP No LMP recorded. Patient has had an implant. Patient reports she is on oral birth control as well as has nexplanin. States she does not have menstrual cycles, denies any chance of pregnancy.   Past Medical History:  Diagnosis Date  . 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 Endometriosis determined by laparoscopy     Patient Active Problem List   Diagnosis Date Noted  . Endometriosis determined by laparoscopy   . Pelvic pain in female  12/25/2015  . 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 LAPAROSCOPY N/A 12/25/2015   Procedure: LAPAROSCOPY DIAGNOSTIC;  Surgeon: Conard Novak, MD;  Location: [facility] OR[facility];  Service: Gynecology;  Laterality: N/A;     No current facility-administered medications for this encounter.   Current Outpatient Prescriptions:  .  etonogestrel (NEXPLANON) 68 MG IMPL implant, 1 each by Subdermal route once., Disp: , Rfl:  .  gabapentin (NEURONTIN) 100 MG capsule, Take 100 mg by mouth at bedtime., Disp: , Rfl:  .  Norethindrone-Ethinyl Estradiol-Fe Biphas (LO LOESTRIN FE) 1 MG-10 MCG / 10 MCG tablet, Take 1 tablet by mouth daily., Disp: 3 Package, Rfl: 0 .  traZODone (DESYREL) 100 MG tablet, Take 100 mg by mouth at bedtime., Disp: , Rfl:  .  venlafaxine (EFFEXOR) 75 MG tablet, Take 75 mg by mouth 2 (two) times daily., Disp: , Rfl:  .  Ascorbic Acid (VITAMIN C) 100 MG tablet, Take by mouth., Disp: , Rfl:  .  doxycycline (VIBRAMYCIN) 100 MG capsule, Take 1 capsule (100 mg total) by mouth 2 (two) times daily., Disp: 20 capsule, Rfl: 0 .  mupirocin ointment (BACTROBAN) 2 %, Apply two times a day for 7 days., Disp: 22 g, Rfl: 0 .  traZODone (DESYREL) 50 MG tablet, TAKE 1 TABLET(50 MG) BY MOUTH EVERY NIGHT, Disp: , Rfl:   Allergies Citalopram and Sulfa antibiotics  Family History  Problem Relation Age of Onset  . Charcot-Marie-Tooth disease Father   . Charcot-Marie-Tooth disease Sister   . Heart disease Maternal Grandmother   . Hyperlipidemia Maternal Grandmother   .  Heart disease Maternal Grandfather     Social History Social History  Substance Use Topics  . Smoking status: Current Every Day Smoker    Packs/day: 0.50    Types: Cigarettes  . Smokeless tobacco: Never Used  . Alcohol use No    Review of Systems Constitutional: No fever/chills Cardiovascular: Denies chest pain. Respiratory: Denies shortness of  breath. Gastrointestinal: No abdominal pain.  No nausea, no vomiting.  Genitourinary: Negative for dysuria. Musculoskeletal: Negative for back pain. Skin:AS above.   ____________________________________________   PHYSICAL EXAM:  VITAL SIGNS: ED Triage Vitals  Enc Vitals Group     BP 06/03/17 1320 122/66     Pulse Rate 06/03/17 1320 92     Resp 06/03/17 1320 16     Temp 06/03/17 1320 98.8 F (37.1 C)     Temp Source 06/03/17 1320 Oral     SpO2 06/03/17 1320 100 %     Weight 06/03/17 1321 140 lb (63.5 kg)     Height 06/03/17 1321  (1.6 m)     Head Circumference --      Peak Flow --      Pain Score 06/03/17 1322 5     Pain Loc --      Pain Edu? --      Excl. in GC? --     Constitutional: Alert and oriented. Well appearing and in no acute distress. Cardiovascular: Normal rate, regular rhythm. Grossly normal heart sounds.  Good peripheral circulation. Respiratory: Normal respiratory effort without tachypnea nor retractions. Breath sounds are clear and equal bilaterally. No wheezes, rales, rhonchi. Musculoskeletal:Steady gait. Left brachial and distal radial pulses equal bilaterally. Left volar forearm area of 2 x 1.5 cm of erythematous induration without fluctuance, centered scabbing, with 6 x 12 cm surrounding erythema, no central clearing, no foreign body visualized, minimal tenderness to direct palpation of centered induration, left arm otherwise nontender, normal distal sensation, no circumferential edema. Neurologic:  Normal speech and language. Speech is normal. No gait instability.  Skin:  Skin is warm, dry Psychiatric: Mood and affect are normal. Speech and behavior are normal. Patient exhibits appropriate insight and judgment   ___________________________________________   LABS (all labs ordered are listed, but only abnormal results are displayed)  Labs Reviewed - No data to display ____________________________________________  PROCEDURES Procedures    INITIAL IMPRESSION / ASSESSMENT AND PLAN / ED COURSE  Pertinent labs & imaging results that were available during my care of the patient were reviewed by me and considered in my medical decision making (see chart for details).  Well-appearing patient. No acute distress. Left forearm cellulitis and abscess. Indurated area without fluctuance, no I&D indication. Patient was unsure of last tetanus immunization, tetanus immunization updated. Patient with sulfa allergy. Denies history of MRSA. Will start patient on oral doxycycline and topical Bactroban. Encouraged elevation, close monitoring and strict follow-up and return parameters given. Discussed indication, risks and benefits of medications with patient.  Discussed follow up with Primary care physician this week. Discussed follow up and return parameters including no resolution or any worsening concerns. Patient verbalized understanding and agreed to plan.   ____________________________________________   FINAL CLINICAL IMPRESSION(S) / ED DIAGNOSES  Final diagnoses:  Abscess of left forearm  Cellulitis of left forearm     New Prescriptions   DOXYCYCLINE (VIBRAMYCIN) 100 MG CAPSULE    Take 1 capsule (100 mg total) by mouth 2 (two) times daily.   MUPIROCIN OINTMENT (BACTROBAN) 2 %    Apply two times a  day for 7 days.    Note: This dictation was prepared with Dragon dictation along with smaller phrase technology. Any transcriptional errors that result from this process are unintentional.         Renford Dills, NP 06/03/17 1355

## 2017-06-05 ENCOUNTER — Emergency Department
Admission: EM | Admit: 2017-06-05 | Discharge: 2017-06-05 | Disposition: A | Discharge status: Home / Self Care | Payer: Medicaid Other | Attending: Emergency Medicine | Admitting: Emergency Medicine

## 2017-06-05 DIAGNOSIS — L03115 Cellulitis of right lower limb: Secondary | ICD-10-CM

## 2017-06-05 DIAGNOSIS — F17210 Nicotine dependence, cigarettes, uncomplicated: Secondary | ICD-10-CM | POA: Insufficient documentation

## 2017-06-05 DIAGNOSIS — L02414 Cutaneous abscess of left upper limb: Secondary | ICD-10-CM | POA: Insufficient documentation

## 2017-06-05 DIAGNOSIS — L0291 Cutaneous abscess, unspecified: Secondary | ICD-10-CM

## 2017-06-05 DIAGNOSIS — R42 Dizziness and giddiness: Secondary | ICD-10-CM | POA: Insufficient documentation

## 2017-06-05 DIAGNOSIS — Z79899 Other long term (current) drug therapy: Secondary | ICD-10-CM | POA: Insufficient documentation

## 2017-06-05 LAB — BASIC METABOLIC PANEL
ANION GAP: 5 (ref 5–15)
BUN: 9 mg/dL (ref 6–20)
CALCIUM: 9 mg/dL (ref 8.9–10.3)
CO2: 26 mmol/L (ref 22–32)
CREATININE: 0.64 mg/dL (ref 0.44–1.00)
Chloride: 108 mmol/L (ref 101–111)
Glucose, Bld: 99 mg/dL (ref 65–99)
Potassium: 4 mmol/L (ref 3.5–5.1)
SODIUM: 139 mmol/L (ref 135–145)

## 2017-06-05 LAB — CBC
HCT: 38.8 % (ref 35.0–47.0)
Hemoglobin: 13.5 g/dL (ref 12.0–16.0)
MCH: 30.8 pg (ref 26.0–34.0)
MCHC: 34.8 g/dL (ref 32.0–36.0)
MCV: 88.6 fL (ref 80.0–100.0)
PLATELETS: 262 10*3/uL (ref 150–440)
RBC: 4.38 MIL/uL (ref 3.80–5.20)
RDW: 13.2 % (ref 11.5–14.5)
WBC: 9.5 10*3/uL (ref 3.6–11.0)

## 2017-06-05 MED ORDER — CLINDAMYCIN HCL 300 MG PO CAPS: 300.0000 mg | ORAL_CAPSULE | Freq: Four times a day (QID) | ORAL | 0 refills | Status: AC

## 2017-06-05 MED ORDER — CLINDAMYCIN PHOSPHATE 600 MG/50ML IV SOLN
600.0000 mg | Freq: Once | INTRAVENOUS | Status: AC
  Administered 2017-06-05: 600 mg via INTRAVENOUS
  Filled 2017-06-05: qty 50

## 2017-06-05 MED ORDER — CLINDAMYCIN HCL 300 MG PO CAPS: 300.0000 mg | ORAL_CAPSULE | Freq: Four times a day (QID) | ORAL | 0 refills | Status: DC

## 2017-06-05 MED ORDER — SODIUM CHLORIDE 0.9 % IV BOLUS (SEPSIS)
1000.0000 mL | Freq: Once | INTRAVENOUS | Status: AC
  Administered 2017-06-05: 1000 mL via INTRAVENOUS

## 2017-06-05 MED ORDER — CLINDAMYCIN HCL 300 MG PO CAPS: 300.0000 mg | ORAL_CAPSULE | Freq: Three times a day (TID) | ORAL | 0 refills | Status: DC

## 2017-06-05 NOTE — ED Notes (Signed)
See triage note  States area is more painful and red  States she was placed on doxy and stopped taking d/t possible allergic reaction

## 2017-06-05 NOTE — ED Provider Notes (Signed)
[facility] Emergency Department Provider Note  ____________________________________________  Time seen: Approximately 3:30 PM  I have reviewed the triage vital signs and the nursing notes.   HISTORY  Chief Complaint Insect Bite    HPI Autumn Davies is a 26 y.o. female that presents to emergency department for evaluation of rash to left forearm for 2 days.She was seen at [facility] clinic and was given a prescription for doxycycline but this made her itch. Redness has been increasing and area began draining yellow discharge yesterday. It is tender to palpation. She also felt dizzy yesterday. She denies any known insect bites. No fever, chills, shortness of breath, vomiting, abdominal pain.   Past Medical History:  Diagnosis Date  . 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 Endometriosis determined by laparoscopy     Patient Active Problem List   Diagnosis Date Noted  . Endometriosis determined by laparoscopy   . Pelvic pain in female 12/25/2015  . 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 LAPAROSCOPY N/A 12/25/2015   Procedure: LAPAROSCOPY DIAGNOSTIC;  Surgeon: Conard Novak, MD;  Location: [facility] ORS;  Service: Gynecology;  Laterality: N/A;    Prior to Admission medications   Medication Sig Start Date End Date Taking? Authorizing Provider  Ascorbic Acid (VITAMIN C) 100 MG tablet Take by mouth.    [provider]  clindamycin (CLEOCIN) 300 MG capsule Take 1 capsule (300 mg total) by mouth 3 (three) times daily. 06/05/17 06/15/17  Enid Derry, PA-C  doxycycline (VIBRAMYCIN) 100 MG capsule Take 1 capsule (100 mg total) by mouth 2 (two) times daily. 06/03/17   Renford Dills, NP  etonogestrel (NEXPLANON) 68 MG IMPL implant 1 each by Subdermal route once.    [provider]  gabapentin  (NEURONTIN) 100 MG capsule Take 100 mg by mouth at bedtime.    [provider]  mupirocin ointment (BACTROBAN) 2 % Apply two times a day for 7 days. 06/03/17   Renford Dills, NP  Norethindrone-Ethinyl Estradiol-Fe Biphas (LO LOESTRIN FE) 1 MG-10 MCG / 10 MCG tablet Take 1 tablet by mouth daily. 01/31/17 06/03/17  Conard Novak, MD  traZODone (DESYREL) 100 MG tablet Take 100 mg by mouth at bedtime.    [provider]  traZODone (DESYREL) 50 MG tablet TAKE 1 TABLET(50 MG) BY MOUTH EVERY NIGHT 12/03/16 09/24/17  [provider]  venlafaxine (EFFEXOR) 75 MG tablet Take 75 mg by mouth 2 (two) times daily.    [provider]    Allergies Citalopram and Sulfa antibiotics  Family History  Problem Relation Age of Onset  . Charcot-Marie-Tooth disease Father   . Charcot-Marie-Tooth disease Sister   . Heart disease Maternal Grandmother   . Hyperlipidemia Maternal Grandmother   . Heart disease Maternal Grandfather     Social History Social History  Substance Use Topics  . Smoking status: Current Every Day Smoker    Packs/day: 0.50    Types: Cigarettes  . Smokeless tobacco: Never Used  . Alcohol use No     Review of Systems  Constitutional: No fever/chills Cardiovascular: No chest pain. Respiratory: No SOB. Gastrointestinal: No abdominal pain.  No vomiting.  Musculoskeletal: Negative for musculoskeletal pain. Skin: Negative for abrasions, lacerations, ecchymosis. Positive for rash.   ____________________________________________   PHYSICAL EXAM:  VITAL SIGNS: ED Triage Vitals [06/05/17 1329]  Enc Vitals Group  BP 121/67     Pulse Rate 79     Resp 18     Temp 98.1 F (36.7 C)     Temp Source Oral     SpO2 100 %     Weight 140 lb (63.5 kg)     Height  (1.6 m)     Head Circumference      Peak Flow      Pain Score      Pain Loc      Pain Edu?      Excl. in GC?      Constitutional: Alert and oriented. Well appearing and in no  acute distress. Eyes: Conjunctivae are normal. PERRL. EOMI. Head: Atraumatic. ENT:      Ears:      Nose: No congestion/rhinnorhea.      Mouth/Throat: Mucous membranes are moist.  Neck: No stridor.  Cardiovascular: Normal rate, regular rhythm.  Good peripheral circulation. Radial pulses equal bilaterally. Respiratory: Normal respiratory effort without tachypnea or retractions. Lungs CTAB. Good air entry to the bases with no decreased or absent breath sounds. Musculoskeletal: Full range of motion to all extremities. No gross deformities appreciated. Neurologic:  Normal speech and language. No gross focal neurologic deficits are appreciated.  Skin:  Skin is warm, dry and intact. Left volar forearm area of 6x12 cm of erythematous skin with central puncture and active yellow drainage. No tenderness to palpation.   ____________________________________________   LABS (all labs ordered are listed, but only abnormal results are displayed)  Labs Reviewed  CBC  BASIC METABOLIC PANEL   ____________________________________________  EKG   ____________________________________________  RADIOLOGY   No results found.  ____________________________________________    PROCEDURES  Procedure(s) performed:    Procedures    Medications  clindamycin (CLEOCIN) IVPB 600 mg (600 mg Intravenous New Bag/Given 06/05/17 1515)  sodium chloride 0.9 % bolus 1,000 mL (1,000 mLs Intravenous New Bag/Given 06/05/17 1515)     ____________________________________________   INITIAL IMPRESSION / ASSESSMENT AND PLAN / ED COURSE  Pertinent labs & imaging results that were available during my care of the patient were reviewed by me and considered in my medical decision making (see chart for details).  Review of the [place] CSRS was performed in accordance of the [facility] prior to dispensing any controlled drugs.   Patient's diagnosis is consistent with abscess and cellulitis. Vital signs, labwork, and exam  are reassuring. Patient appears well.  She was given fluids and IM clindamycin in ED. Abscesses is actively draining. Patient will be discharged home with prescriptions for clindamycin. Patient is to follow up with PCP as directed. Patient is given ED precautions to return to the ED for any worsening or new symptoms.     ____________________________________________  FINAL CLINICAL IMPRESSION(S) / ED DIAGNOSES  Final diagnoses:  Abscess  Cellulitis of right lower extremity      NEW MEDICATIONS STARTED DURING THIS VISIT:  New Prescriptions   CLINDAMYCIN (CLEOCIN) 300 MG CAPSULE    Take 1 capsule (300 mg total) by mouth 3 (three) times daily.        This chart was dictated using voice recognition software/Dragon. Despite best efforts to proofread, errors can occur which can change the meaning. Any change was purely unintentional.    Enid Derry, PA-C 06/05/17 1611    Emily Filbert, MD 06/06/17 [id number]

## 2017-06-05 NOTE — ED Triage Notes (Signed)
Pt reports believes bitten by spider 2 days ago, was given doxycycline, had allergic reaction, stopped taking antibiotics and now c/o pain and increased redness and swelling to bite area (left forearm)

## 2017-08-15 DIAGNOSIS — F5104 Psychophysiologic insomnia: Secondary | ICD-10-CM | POA: Insufficient documentation

## 2018-01-12 ENCOUNTER — Ambulatory Visit (INDEPENDENT_AMBULATORY_CARE_PROVIDER_SITE_OTHER): Payer: BLUE CROSS/BLUE SHIELD | Admitting: Obstetrics & Gynecology

## 2018-01-12 ENCOUNTER — Telehealth: Payer: Self-pay | Admitting: Obstetrics & Gynecology

## 2018-01-12 ENCOUNTER — Encounter: Payer: Self-pay | Admitting: Obstetrics & Gynecology

## 2018-01-12 VITALS — BP 100/70 | Ht 63.0 in | Wt 168.0 lb

## 2018-01-12 DIAGNOSIS — N921 Excessive and frequent menstruation with irregular cycle: Secondary | ICD-10-CM | POA: Diagnosis not present

## 2018-01-12 MED ORDER — MEDROXYPROGESTERONE ACETATE 10 MG PO TABS: 10.0000 mg | ORAL_TABLET | Freq: Every day | ORAL | 0 refills | Status: DC

## 2018-01-12 NOTE — Progress Notes (Signed)
HPI:      Autumn Davies is a 27 y.o. G2P1011 who LMP was Patient's last menstrual period was 01/10/2018., presents today for a problem visit.  She complains of menometrorrhagia that  began several days ago and its severity is described as moderate.  Associated with severe crampy abd pains that does not radiate and is isolated to the deep pelvis, NSAIDs w mild effect, no other assoc sx's or modifiers. She has NEXPLANON and has had irregular periods from 30 to 100 days and they are associated with no menstrual cramping.  She has used the following for attempts at control: thin pad.  Previous evaluation: none. Prior Diagnosis: endometriosis years ago.  Has had Nexplanon for olast 6 years, due fo exchange soon.. Previous Treatment: none.  PMHx: She  has a past medical history of Charcot-Marie disease and Endometriosis determined by laparoscopy. Also,  has a past surgical history that includes laparoscopy (N/A, 12/25/2015)., family history includes Charcot-Marie-Tooth disease in her father and sister; Heart disease in her maternal grandfather and maternal grandmother; Hyperlipidemia in her maternal grandmother.,  reports that she has been smoking cigarettes.  She has been smoking about 0.50 packs per day. She has never used smokeless tobacco. She reports that she does not drink alcohol or use drugs.  She  Current Outpatient Medications:  .  buPROPion (WELLBUTRIN XL) 300 MG 24 hr tablet, Take by mouth., Disp: , Rfl:  .  etonogestrel (NEXPLANON) 68 MG IMPL implant, 1 each by Subdermal route once., Disp: , Rfl:  .  gabapentin (NEURONTIN) 100 MG capsule, Take 100 mg by mouth at bedtime., Disp: , Rfl:  .  Ascorbic Acid (VITAMIN C) 100 MG tablet, Take by mouth., Disp: , Rfl:  .  doxepin (SINEQUAN) 10 MG capsule, , Disp: , Rfl: 8 .  doxycycline (VIBRAMYCIN) 100 MG capsule, Take 1 capsule (100 mg total) by mouth 2 (two) times daily. (Patient not taking: Reported on 01/12/2018), Disp: 20 capsule, Rfl: 0 .   medroxyPROGESTERone (PROVERA) 10 MG tablet, Take 1 tablet (10 mg total) by mouth daily for 10 days., Disp: 10 tablet, Rfl: 0 .  mupirocin ointment (BACTROBAN) 2 %, Apply two times a day for 7 days. (Patient not taking: Reported on 01/12/2018), Disp: 22 g, Rfl: 0 .  Norethindrone-Ethinyl Estradiol-Fe Biphas (LO LOESTRIN FE) 1 MG-10 MCG / 10 MCG tablet, Take 1 tablet by mouth daily., Disp: 3 Package, Rfl: 0 .  traZODone (DESYREL) 100 MG tablet, Take 100 mg by mouth at bedtime., Disp: , Rfl:  .  traZODone (DESYREL) 50 MG tablet, TAKE 1 TABLET(50 MG) BY MOUTH EVERY NIGHT, Disp: , Rfl:  .  venlafaxine (EFFEXOR) 75 MG tablet, Take 75 mg by mouth 2 (two) times daily., Disp: , Rfl:   Also, is allergic to citalopram and sulfa antibiotics.  Review of Systems  Constitutional: Negative for chills, fever and malaise/fatigue.  HENT: Negative for congestion, sinus pain and sore throat.   Eyes: Negative for blurred vision and pain.  Respiratory: Negative for cough and wheezing.   Cardiovascular: Negative for chest pain and leg swelling.  Gastrointestinal: Negative for abdominal pain, constipation, diarrhea, heartburn, nausea and vomiting.  Genitourinary: Negative for dysuria, frequency, hematuria and urgency.  Musculoskeletal: Negative for back pain, joint pain, myalgias and neck pain.  Skin: Negative for itching and rash.  Neurological: Negative for dizziness, tremors and weakness.  Endo/Heme/Allergies: Does not bruise/bleed easily.  Psychiatric/Behavioral: Negative for depression. The patient is not nervous/anxious and does not have insomnia.  Objective BP 100/70   Ht  (1.6 m)   Wt 168 lb (76.2 kg)   LMP 01/10/2018   BMI 29.76 kg/m  Physical Exam  Constitutional: She is oriented to person, place, and time. She appears well-developed and well-nourished. No distress.  Musculoskeletal: Normal range of motion.  Neurological: She is alert and oriented to person, place, and time.  Skin: Skin is  warm and dry.  Psychiatric: She has a normal mood and affect.  Vitals reviewed.  ASSESSMENT/PLAN:  abnormal bleeding on hormone replacement therapy  Problem List Items Addressed This Visit      Other   Menometrorrhagia - Primary    Synptoms new, likely related to Nexplanon almost at expiration date Provera for 10 days now to control current bleeding Nexplanon exchange soon    Likely to solve her concerning sx's Consider [place] or other eval if persists PAP due after 02/14/18  Annamarie Major, MD, [facility], [facility] 01/12/2018  9:04 AM

## 2018-01-12 NOTE — Telephone Encounter (Signed)
Noted. Will order to arrive by apt date/time. 

## 2018-01-12 NOTE — Telephone Encounter (Signed)
Pt coming 5/30 at 350 with [facility] for nexplanon

## 2018-01-12 NOTE — Patient Instructions (Signed)
Etonogestrel implant What is this medicine? ETONOGESTREL (et oh noe JES trel) is a contraceptive (birth control) device. It is used to prevent pregnancy. It can be used for up to 3 years. This medicine may be used for other purposes; ask your health care provider or pharmacist if you have questions. COMMON BRAND NAME(S): Implanon, Nexplanon What should I tell my health care provider before I take this medicine? They need to know if you have any of these conditions: -abnormal vaginal bleeding -blood vessel disease or blood clots -cancer of the breast, cervix, or liver -depression -diabetes -gallbladder disease -headaches -heart disease or recent heart attack -high blood pressure -high cholesterol -kidney disease -liver disease -renal disease -seizures -tobacco smoker -an unusual or allergic reaction to etonogestrel, other hormones, anesthetics or antiseptics, medicines, foods, dyes, or preservatives -pregnant or trying to get pregnant -breast-feeding How should I use this medicine? This device is inserted just under the skin on the inner side of your upper arm by a health care professional. Talk to your pediatrician regarding the use of this medicine in children. Special care may be needed. Overdosage: If you think you have taken too much of this medicine contact a poison control center or emergency room at once. NOTE: This medicine is only for you. Do not share this medicine with others. What if I miss a dose? This does not apply. What may interact with this medicine? Do not take this medicine with any of the following medications: -amprenavir -bosentan -fosamprenavir This medicine may also interact with the following medications: -barbiturate medicines for inducing sleep or treating seizures -certain medicines for fungal infections like ketoconazole and itraconazole -grapefruit juice -griseofulvin -medicines to treat seizures like carbamazepine, felbamate, oxcarbazepine,  phenytoin, topiramate -modafinil -phenylbutazone -rifampin -rufinamide -some medicines to treat HIV infection like atazanavir, indinavir, lopinavir, nelfinavir, tipranavir, ritonavir -St. John's wort This list may not describe all possible interactions. Give your health care provider a list of all the medicines, herbs, non-prescription drugs, or dietary supplements you use. Also tell them if you smoke, drink alcohol, or use illegal drugs. Some items may interact with your medicine. What should I watch for while using this medicine? This product does not protect you against HIV infection (AIDS) or other sexually transmitted diseases. You should be able to feel the implant by pressing your fingertips over the skin where it was inserted. Contact your doctor if you cannot feel the implant, and use a non-hormonal birth control method (such as condoms) until your doctor confirms that the implant is in place. If you feel that the implant may have broken or become bent while in your arm, contact your healthcare provider. What side effects may I notice from receiving this medicine? Side effects that you should report to your doctor or health care professional as soon as possible: -allergic reactions like skin rash, itching or hives, swelling of the face, lips, or tongue -breast lumps -changes in emotions or moods -depressed mood -heavy or prolonged menstrual bleeding -pain, irritation, swelling, or bruising at the insertion site -scar at site of insertion -signs of infection at the insertion site such as fever, and skin redness, pain or discharge -signs of pregnancy -signs and symptoms of a blood clot such as breathing problems; changes in vision; chest pain; severe, sudden headache; pain, swelling, warmth in the leg; trouble speaking; sudden numbness or weakness of the face, arm or leg -signs and symptoms of liver injury like dark yellow or brown urine; general ill feeling or flu-like symptoms;  light-colored   stools; loss of appetite; nausea; right upper belly pain; unusually weak or tired; yellowing of the eyes or skin -unusual vaginal bleeding, discharge -signs and symptoms of a stroke like changes in vision; confusion; trouble speaking or understanding; severe headaches; sudden numbness or weakness of the face, arm or leg; trouble walking; dizziness; loss of balance or coordination Side effects that usually do not require medical attention (report to your doctor or health care professional if they continue or are bothersome): -acne -back pain -breast pain -changes in weight -dizziness -general ill feeling or flu-like symptoms -headache -irregular menstrual bleeding -nausea -sore throat -vaginal irritation or inflammation This list may not describe all possible side effects. Call your doctor for medical advice about side effects. You may report side effects to FDA at 1-800-FDA-1088. Where should I keep my medicine? This drug is given in a hospital or clinic and will not be stored at home. NOTE: This sheet is a summary. It may not cover all possible information. If you have questions about this medicine, talk to your doctor, pharmacist, or health care provider.  2018 Elsevier/Gold Standard (2016-03-04 11:19:22)  

## 2018-01-26 ENCOUNTER — Encounter: Payer: Self-pay | Admitting: Obstetrics & Gynecology

## 2018-01-26 ENCOUNTER — Ambulatory Visit (INDEPENDENT_AMBULATORY_CARE_PROVIDER_SITE_OTHER): Payer: BLUE CROSS/BLUE SHIELD | Admitting: Obstetrics & Gynecology

## 2018-01-26 VITALS — BP 100/60 | Ht 63.0 in | Wt 160.0 lb

## 2018-01-26 DIAGNOSIS — Z3046 Encounter for surveillance of implantable subdermal contraceptive: Secondary | ICD-10-CM

## 2018-01-26 NOTE — Patient Instructions (Signed)
Etonogestrel implant What is this medicine? ETONOGESTREL (et oh noe JES trel) is a contraceptive (birth control) device. It is used to prevent pregnancy. It can be used for up to 3 years. This medicine may be used for other purposes; ask your health care provider or pharmacist if you have questions. COMMON BRAND NAME(S): Implanon, Nexplanon What should I tell my health care provider before I take this medicine? They need to know if you have any of these conditions: -abnormal vaginal bleeding -blood vessel disease or blood clots -cancer of the breast, cervix, or liver -depression -diabetes -gallbladder disease -headaches -heart disease or recent heart attack -high blood pressure -high cholesterol -kidney disease -liver disease -renal disease -seizures -tobacco smoker -an unusual or allergic reaction to etonogestrel, other hormones, anesthetics or antiseptics, medicines, foods, dyes, or preservatives -pregnant or trying to get pregnant -breast-feeding How should I use this medicine? This device is inserted just under the skin on the inner side of your upper arm by a health care professional. Talk to your pediatrician regarding the use of this medicine in children. Special care may be needed. Overdosage: If you think you have taken too much of this medicine contact a poison control center or emergency room at once. NOTE: This medicine is only for you. Do not share this medicine with others. What if I miss a dose? This does not apply. What may interact with this medicine? Do not take this medicine with any of the following medications: -amprenavir -bosentan -fosamprenavir This medicine may also interact with the following medications: -barbiturate medicines for inducing sleep or treating seizures -certain medicines for fungal infections like ketoconazole and itraconazole -grapefruit juice -griseofulvin -medicines to treat seizures like carbamazepine, felbamate, oxcarbazepine,  phenytoin, topiramate -modafinil -phenylbutazone -rifampin -rufinamide -some medicines to treat HIV infection like atazanavir, indinavir, lopinavir, nelfinavir, tipranavir, ritonavir -St. John's wort This list may not describe all possible interactions. Give your health care provider a list of all the medicines, herbs, non-prescription drugs, or dietary supplements you use. Also tell them if you smoke, drink alcohol, or use illegal drugs. Some items may interact with your medicine. What should I watch for while using this medicine? This product does not protect you against HIV infection (AIDS) or other sexually transmitted diseases. You should be able to feel the implant by pressing your fingertips over the skin where it was inserted. Contact your doctor if you cannot feel the implant, and use a non-hormonal birth control method (such as condoms) until your doctor confirms that the implant is in place. If you feel that the implant may have broken or become bent while in your arm, contact your healthcare provider. What side effects may I notice from receiving this medicine? Side effects that you should report to your doctor or health care professional as soon as possible: -allergic reactions like skin rash, itching or hives, swelling of the face, lips, or tongue -breast lumps -changes in emotions or moods -depressed mood -heavy or prolonged menstrual bleeding -pain, irritation, swelling, or bruising at the insertion site -scar at site of insertion -signs of infection at the insertion site such as fever, and skin redness, pain or discharge -signs of pregnancy -signs and symptoms of a blood clot such as breathing problems; changes in vision; chest pain; severe, sudden headache; pain, swelling, warmth in the leg; trouble speaking; sudden numbness or weakness of the face, arm or leg -signs and symptoms of liver injury like dark yellow or brown urine; general ill feeling or flu-like symptoms;  light-colored   stools; loss of appetite; nausea; right upper belly pain; unusually weak or tired; yellowing of the eyes or skin -unusual vaginal bleeding, discharge -signs and symptoms of a stroke like changes in vision; confusion; trouble speaking or understanding; severe headaches; sudden numbness or weakness of the face, arm or leg; trouble walking; dizziness; loss of balance or coordination Side effects that usually do not require medical attention (report to your doctor or health care professional if they continue or are bothersome): -acne -back pain -breast pain -changes in weight -dizziness -general ill feeling or flu-like symptoms -headache -irregular menstrual bleeding -nausea -sore throat -vaginal irritation or inflammation This list may not describe all possible side effects. Call your doctor for medical advice about side effects. You may report side effects to FDA at 1-800-FDA-1088. Where should I keep my medicine? This drug is given in a hospital or clinic and will not be stored at home. NOTE: This sheet is a summary. It may not cover all possible information. If you have questions about this medicine, talk to your doctor, pharmacist, or health care provider.  2018 Elsevier/Gold Standard (2016-03-04 11:19:22)  

## 2018-01-26 NOTE — Progress Notes (Signed)
  Nexplanon removal Procedure note - The Nexplanon was noted in the patient's arm and the end was identified. The skin was cleansed with a Betadine solution. A small injection of subcutaneous lidocaine with epinephrine was given over the end of the implant. An incision was made at the end of the implant. The rod was noted in the incision and grasped with a hemostat. It was noted to be intact.  Steri-Strip was placed approximating the incision. Hemostasis was noted.  Nexplanon Insertion  Patient given informed consent, signed copy in the chart, time out was performed. Appropriate time out taken.  Patient's LEFT arm was prepped and draped in the usual sterile fashion.. The ruler used to measure and mark insertion area.  Pt was prepped with betadine swab and then injected with 3 cc of 2% lidocaine with epinephrine. Nexplanon removed form packaging,  Device confirmed in needle, then inserted full length of needle and withdrawn per handbook instructions.  Pt insertion site covered with steri-strip and a bandage.   Minimal blood loss.  Pt tolerated the procedure well.   Annamarie Major, MD, [facility], [facility] 01/26/2018  4:56 PM

## 2018-02-21 ENCOUNTER — Other Ambulatory Visit
Admission: RE | Admit: 2018-02-21 | Discharge: 2018-02-21 | Disposition: A | Discharge status: Home / Self Care | Payer: Medicaid Other | Source: Ambulatory Visit | Attending: Gastroenterology | Admitting: Gastroenterology

## 2018-02-21 DIAGNOSIS — R1013 Epigastric pain: Secondary | ICD-10-CM | POA: Insufficient documentation

## 2018-02-23 LAB — H. PYLORI ANTIGEN, STOOL: H. PYLORI STOOL AG, EIA: NEGATIVE

## 2018-03-21 NOTE — Telephone Encounter (Signed)
Nexplanon rcvd/charged 01/26/18

## 2018-04-03 ENCOUNTER — Other Ambulatory Visit: Payer: Self-pay | Admitting: Gastroenterology

## 2018-04-03 DIAGNOSIS — R1013 Epigastric pain: Secondary | ICD-10-CM

## 2018-04-03 DIAGNOSIS — R110 Nausea: Secondary | ICD-10-CM

## 2018-05-31 ENCOUNTER — Ambulatory Visit
Admission: RE | Admit: 2018-05-31 | Discharge: 2018-05-31 | Disposition: A | Discharge status: Home / Self Care | Payer: BLUE CROSS/BLUE SHIELD | Source: Ambulatory Visit | Attending: Family Medicine | Admitting: Family Medicine

## 2018-05-31 ENCOUNTER — Other Ambulatory Visit: Payer: Self-pay | Admitting: Family Medicine

## 2018-05-31 DIAGNOSIS — M25572 Pain in left ankle and joints of left foot: Secondary | ICD-10-CM

## 2019-02-23 ENCOUNTER — Other Ambulatory Visit: Payer: Self-pay

## 2019-02-23 ENCOUNTER — Encounter: Payer: Self-pay | Admitting: Obstetrics and Gynecology

## 2019-02-23 ENCOUNTER — Ambulatory Visit (INDEPENDENT_AMBULATORY_CARE_PROVIDER_SITE_OTHER): Payer: BC Managed Care – PPO | Admitting: Obstetrics and Gynecology

## 2019-02-23 VITALS — BP 122/74 | Ht 63.0 in | Wt 183.0 lb

## 2019-02-23 DIAGNOSIS — Z01419 Encounter for gynecological examination (general) (routine) without abnormal findings: Secondary | ICD-10-CM | POA: Diagnosis not present

## 2019-02-23 DIAGNOSIS — Z1339 Encounter for screening examination for other mental health and behavioral disorders: Secondary | ICD-10-CM

## 2019-02-23 DIAGNOSIS — Z1331 Encounter for screening for depression: Secondary | ICD-10-CM

## 2019-02-23 NOTE — Progress Notes (Signed)
Gynecology Annual Exam  PCP: Autumn Davies, Autumn Burney, NP  Chief Complaint  Patient presents with  . Annual Exam   History of Present Illness:  Ms. Autumn Davies is a 28 y.o. G2P1011 who LMP was No LMP recorded. Patient has had an implant., presents today for her annual examination.  Her menses are absent with Nexplanon.   Endometriosis: She is having no pain. She states that it is been at least a year since her last flair.   She is sexually active. No problems with intercourse. SHe is using Nexplanon for contraception. .  Last Pap: 2 years.  Results were: no abnormalities /neg HPV DNA not done Hx of STDs: none  There is no FH of breast cancer. There is no FH of ovarian cancer. The patient does do self-breast exams.  Tobacco use: The patient denies current or previous tobacco use. Alcohol use: social drinker Exercise: moderately active  The patient wears seatbelts: yes.   The patient reports that domestic violence in her life is absent.   Since having her Nexplanon changed she has had some weight gain and some bloating.  She has tried adjusting her diet. She has been exercising. She has gained about 30 pounds in 2 years.  Past Medical History:  Diagnosis Date  . 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 Endometriosis determined by laparoscopy     Past Surgical History:  Procedure Laterality Date  . LAPAROSCOPY N/A 12/25/2015   Procedure: LAPAROSCOPY DIAGNOSTIC;  Surgeon: Conard NovakStephen D Marlon Vonruden, MD;  Location: [facility] ORS;  Service: Gynecology;  Laterality: N/A;    Prior to Admission medications   Medication Sig Start Date End Date Taking? Authorizing Provider  doxepin (SINEQUAN) 10 MG capsule  01/05/18  Yes [provider]  etonogestrel (NEXPLANON) 68 MG IMPL implant 1 each by Subdermal route once.   Yes [provider]  gabapentin (NEURONTIN) 100 MG capsule Take 100 mg by  mouth at bedtime.   Yes [provider]    Allergies  Allergen Reactions  . Citalopram   . Sulfa Antibiotics Rash   Obstetric History: G2P1011  Social History   Socioeconomic History  . Marital status: Single    Spouse name: Not on file  . Number of children: Not on file  . Years of education: Not on file  . Highest education level: Not on file  Occupational History  . Not on file  Social Needs  . Financial resource strain: Not on file  . Food insecurity    Worry: Not on file    Inability: Not on file  . Transportation needs    Medical: Not on file    Non-medical: Not on file  Tobacco Use  . Smoking status: Never Smoker  . Smokeless tobacco: Never Used  Substance and Sexual Activity  . Alcohol use: No  . Drug use: No  . Sexual activity: Yes    Birth control/protection: Implant  Lifestyle  . Physical activity    Days per week: Not on file    Minutes per session: Not on file  . Stress: Not on file  Relationships  . Social Musicianconnections    Talks on phone: Not on file    Gets together: Not on file    Attends religious service: Not on file    Active member of club or organization: Not on file    Attends meetings of clubs or organizations: Not on  file    Relationship status: Not on file  . Intimate partner violence    Fear of current or ex partner: Not on file    Emotionally abused: Not on file    Physically abused: Not on file    Forced sexual activity: Not on file  Other Topics Concern  . Not on file  Social History Narrative  . Not on file    Family History  Problem Relation Age of Onset  . Charcot-Marie-Tooth disease Father   . Charcot-Marie-Tooth disease Sister   . Heart disease Maternal Grandmother   . Hyperlipidemia Maternal Grandmother   . Heart disease Maternal Grandfather     Review of Systems  Constitutional: Negative.   HENT: Negative.   Eyes: Negative.   Respiratory: Negative.   Cardiovascular: Negative.   Gastrointestinal:  Negative.   Genitourinary: Negative.   Musculoskeletal: Negative.   Skin: Negative.   Neurological: Negative.   Psychiatric/Behavioral: Negative.      Physical Exam BP 122/74   Ht 5\' 3"  (1.6 m)   Wt 183 lb (83 kg)   BMI 32.42 kg/m    Physical Exam Constitutional:      General: She is not in acute distress.    Appearance: Normal appearance. She is well-developed.  Genitourinary:     Pelvic exam was performed with patient supine.     Vulva, urethra, bladder and uterus normal.     No inguinal adenopathy present in the right or left side.    No signs of injury in the vagina.     No vaginal discharge, erythema, tenderness or bleeding.     No cervical motion tenderness, discharge, lesion or polyp.     Uterus is mobile.     Uterus is not enlarged or tender.     No uterine mass detected.    Uterus is anteverted.     No right or left adnexal mass present.     Right adnexa not tender or full.     Left adnexa not tender or full.  HENT:     Head: Normocephalic and atraumatic.  Eyes:     General: No scleral icterus.    Conjunctiva/sclera: Conjunctivae normal.  Neck:     Musculoskeletal: Normal range of motion and neck supple.     Thyroid: No thyromegaly.  Cardiovascular:     Rate and Rhythm: Normal rate and regular rhythm.     Heart sounds: No murmur. No friction rub. No gallop.   Pulmonary:     Effort: Pulmonary effort is normal. No respiratory distress.     Breath sounds: Normal breath sounds. No wheezing or rales.  Chest:     Breasts:        Right: No inverted nipple, mass, nipple discharge, skin change or tenderness.        Left: No inverted nipple, mass, nipple discharge, skin change or tenderness.  Abdominal:     General: Bowel sounds are normal. There is no distension.     Palpations: Abdomen is soft. There is no mass.     Tenderness: There is no abdominal tenderness. There is no guarding or rebound.  Musculoskeletal: Normal range of motion.        General: No  swelling or tenderness.  Lymphadenopathy:     Cervical: No cervical adenopathy.     Lower Body: No right inguinal adenopathy. No left inguinal adenopathy.  Neurological:     General: No focal deficit present.     Mental Status: She is alert  and oriented to person, place, and time.     Cranial Nerves: No cranial nerve deficit.  Skin:    General: Skin is warm and dry.     Findings: No erythema or rash.  Psychiatric:        Mood and Affect: Mood normal.        Behavior: Behavior normal.        Judgment: Judgment normal.    Female chaperone present for pelvic and breast  portions of the physical exam  Results: AUDIT Questionnaire (screen for alcoholism): 4 PHQ-9: 6  Assessment: 28 y.o. 912P1011 female here for routine annual gynecologic examination  Plan: Problem List Items Addressed This Visit    None    Visit Diagnoses    Women's annual routine gynecological examination    -  Primary   Screening for depression       Screening for alcoholism          Screening: -- Blood pressure screen normal -- Weight screening: obese: discussed management options, including lifestyle, dietary, and exercise. -- Depression screening negative (PHQ-9) -- Nutrition: normal -- cholesterol screening: not due for screening -- osteoporosis screening: not due -- tobacco screening: not using -- alcohol screening: AUDIT questionnaire indicates low-risk usage. -- family history of breast cancer screening: done. not at high risk. -- no evidence of domestic violence or intimate partner violence. -- STD screening: gonorrhea/chlamydia NAAT not collected per patient request. -- pap smear not collected per ASCCP guidelines -- HPV vaccination series: discussed and she will consider.  Thomasene MohairStephen Saurabh Hettich, MD 02/23/2019 10:31 AM

## 2019-03-09 ENCOUNTER — Ambulatory Visit: Payer: BC Managed Care – PPO | Admitting: Obstetrics and Gynecology

## 2019-03-23 ENCOUNTER — Ambulatory Visit (INDEPENDENT_AMBULATORY_CARE_PROVIDER_SITE_OTHER): Payer: BC Managed Care – PPO | Admitting: Obstetrics and Gynecology

## 2019-03-23 ENCOUNTER — Other Ambulatory Visit: Payer: Self-pay

## 2019-03-23 ENCOUNTER — Encounter: Payer: Self-pay | Admitting: Obstetrics and Gynecology

## 2019-03-23 VITALS — BP 122/74 | Ht 63.0 in | Wt 183.0 lb

## 2019-03-23 DIAGNOSIS — Z30016 Encounter for initial prescription of transdermal patch hormonal contraceptive device: Secondary | ICD-10-CM

## 2019-03-23 DIAGNOSIS — Z3049 Encounter for surveillance of other contraceptives: Secondary | ICD-10-CM

## 2019-03-23 DIAGNOSIS — Z3046 Encounter for surveillance of implantable subdermal contraceptive: Secondary | ICD-10-CM

## 2019-03-23 MED ORDER — XULANE 150-35 MCG/24HR TD PTWK: 1.0000 | MEDICATED_PATCH | TRANSDERMAL | 4 refills | Status: DC

## 2019-03-23 NOTE — Progress Notes (Signed)
     GYNECOLOGY PROCEDURE NOTE  Nexplanon removal discussed in detail.  Risks of infection, bleeding, nerve injury all reviewed.  Patient understands risks and desires to proceed.  Verbal consent obtained.  Patient is certain she wants the Nexplanon removed.  All questions answered.  Procedure: Patient placed in dorsal supine with left arm above head, elbow flexed at 90 degrees, arm resting on examination table.  Nexplanon identified without problems.  Betadine scrub x3.  1 ml of 1% lidocaine injected under Nexplanondevice without problems.  Sterile gloves applied.  Small 0.5cm incision made at distal tip of Nexplanon device with 11 blade scalpel.  Nexplanon brought to incision and grasped with a small kelly clamp.  Nexplanon removed intact without problems.  Pressure applied to incision.  Hemostasis obtained.  Steri-strips applied, followed by bandage and compression dressing.  Patient tolerated procedure well.  No complications.   Assessment: 28 y.o. year old female now s/p uncomplicated Nexplanon removal.  Plan: 1.  Patient given post procedure precautions and asked to call for fever, chills, redness or drainage from her incision, bleeding from incision.  She understands she will likely have a small bruise near site of removal and can remove bandage tomorrow and steri-strips in approximately 1 week.  2) Contraception: Xulane patch.  Rx provided.   Prentice Docker, MD, [facility], [facility] 03/23/2019 2:26 PM

## 2019-07-19 DIAGNOSIS — R519 Headache, unspecified: Secondary | ICD-10-CM | POA: Insufficient documentation

## 2019-11-15 ENCOUNTER — Encounter: Payer: Self-pay | Admitting: Emergency Medicine

## 2019-11-15 ENCOUNTER — Other Ambulatory Visit: Payer: Self-pay

## 2019-11-15 ENCOUNTER — Emergency Department: Payer: BC Managed Care – PPO

## 2019-11-15 ENCOUNTER — Emergency Department
Admission: EM | Admit: 2019-11-15 | Discharge: 2019-11-15 | Disposition: A | Discharge status: Home / Self Care | Payer: BC Managed Care – PPO | Attending: Emergency Medicine | Admitting: Emergency Medicine

## 2019-11-15 DIAGNOSIS — R1011 Right upper quadrant pain: Secondary | ICD-10-CM | POA: Insufficient documentation

## 2019-11-15 DIAGNOSIS — Z79899 Other long term (current) drug therapy: Secondary | ICD-10-CM | POA: Diagnosis not present

## 2019-11-15 LAB — URINALYSIS, COMPLETE (UACMP) WITH MICROSCOPIC
Bacteria, UA: NONE SEEN
Bilirubin Urine: NEGATIVE
Glucose, UA: NEGATIVE mg/dL
Hgb urine dipstick: NEGATIVE
Ketones, ur: NEGATIVE mg/dL
Nitrite: NEGATIVE
Protein, ur: NEGATIVE mg/dL
Specific Gravity, Urine: 1.016 (ref 1.005–1.030)
pH: 5 (ref 5.0–8.0)

## 2019-11-15 LAB — CBC
HCT: 41.5 % (ref 36.0–46.0)
Hemoglobin: 13.6 g/dL (ref 12.0–15.0)
MCH: 29.7 pg (ref 26.0–34.0)
MCHC: 32.8 g/dL (ref 30.0–36.0)
MCV: 90.6 fL (ref 80.0–100.0)
Platelets: 337 10*3/uL (ref 150–400)
RBC: 4.58 MIL/uL (ref 3.87–5.11)
RDW: 13.5 % (ref 11.5–15.5)
WBC: 10.5 10*3/uL (ref 4.0–10.5)
nRBC: 0 % (ref 0.0–0.2)

## 2019-11-15 LAB — COMPREHENSIVE METABOLIC PANEL
ALT: 60 U/L — ABNORMAL HIGH (ref 0–44)
AST: 43 U/L — ABNORMAL HIGH (ref 15–41)
Albumin: 4 g/dL (ref 3.5–5.0)
Alkaline Phosphatase: 49 U/L (ref 38–126)
Anion gap: 10 (ref 5–15)
BUN: 8 mg/dL (ref 6–20)
CO2: 25 mmol/L (ref 22–32)
Calcium: 9.4 mg/dL (ref 8.9–10.3)
Chloride: 102 mmol/L (ref 98–111)
Creatinine, Ser: 0.6 mg/dL (ref 0.44–1.00)
GFR calc Af Amer: >60 mL/min (ref >60)
GFR calc non Af Amer: >60 mL/min (ref >60)
Glucose, Bld: 96 mg/dL (ref 70–99)
Potassium: 3.6 mmol/L (ref 3.5–5.1)
Sodium: 137 mmol/L (ref 135–145)
Total Bilirubin: 0.9 mg/dL (ref 0.3–1.2)
Total Protein: 8 g/dL (ref 6.5–8.1)

## 2019-11-15 LAB — LIPASE, BLOOD: Lipase: 34 U/L (ref 11–51)

## 2019-11-15 NOTE — ED Triage Notes (Signed)
Pt reports pain to her mid epigastric upper abd area that is sharp in nature and started suddenly about 45 minutes ago. Pt reports some nausea as well and has vomited once.

## 2019-11-15 NOTE — Discharge Instructions (Addendum)
Return to the emergency department if worsening Follow-up with your regular doctor if not improving in 2 to 3 days

## 2019-11-15 NOTE — ED Provider Notes (Signed)
[facility] Emergency Department Provider Note  ____________________________________________   First MD Initiated Contact with Patient 11/15/19 1455     (approximate)  I have reviewed the triage vital signs and the nursing notes.   HISTORY  Chief Complaint Abdominal Pain, Nausea, and Emesis    HPI [name] is a 29 y.o. female presents emergency department complaint of sudden onset of right upper quadrant pain which made her vomit.  She has had nausea.  She states she feels hot but no known fever.  No history of gallbladder problems.  States the pain has subsided a little since the onset earlier today.  She last ate an apple about 10 AM.    Past Medical History:  Diagnosis Date  . 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 Endometriosis determined by laparoscopy     Patient Active Problem List   Diagnosis Date Noted  . Menometrorrhagia 01/12/2018  . Endometriosis determined by laparoscopy   . Pelvic pain in female 12/25/2015  . 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 LAPAROSCOPY N/A 12/25/2015   Procedure: LAPAROSCOPY DIAGNOSTIC;  Surgeon: Conard Novak, MD;  Location: [facility] ORS;  Service: Gynecology;  Laterality: N/A;    Prior to Admission medications   Medication Sig Start Date End Date Taking? Authorizing Provider  doxepin (SINEQUAN) 10 MG capsule  01/05/18   [provider]  etonogestrel (NEXPLANON) 68 MG IMPL implant 1 each by Subdermal route once.    [provider]  gabapentin (NEURONTIN) 100 MG capsule Take 100 mg by mouth at bedtime.    [provider]  norelgestromin-ethinyl estradiol Burr Medico) 150-35 MCG/24HR transdermal patch Place 1 patch onto the skin once a week. Use for 3 weeks, then leave off for 1 week, then start cycle over again 03/23/19    Conard Novak, MD    Allergies Citalopram and Sulfa antibiotics  Family History  Problem Relation Age of Onset  . Charcot-Marie-Tooth disease Father   . Charcot-Marie-Tooth disease Sister   . Heart disease Maternal Grandmother   . Hyperlipidemia Maternal Grandmother   . Heart disease Maternal Grandfather     Social History Social History   Tobacco Use  . Smoking status: Never Smoker  . Smokeless tobacco: Never Used  Substance Use Topics  . Alcohol use: No  . Drug use: No    Review of Systems  Constitutional: No fever/chills Eyes: No visual changes. ENT: No sore throat. Respiratory: Denies cough Cardiovascular: Denies chest pain Gastrointestinal: Positive for right upper quadrant abdominal pain Genitourinary: Negative for dysuria. Musculoskeletal: Negative for back pain. Skin: Negative for rash. Psychiatric: no mood changes,     ____________________________________________   PHYSICAL EXAM:  VITAL SIGNS: ED Triage Vitals  Enc Vitals Group     BP 11/15/19 1345 116/68     Pulse Rate 11/15/19 1345 (!) 122     Resp 11/15/19 1345 20     Temp 11/15/19 1345 98.4 F (36.9 C)     Temp Source 11/15/19 1345 Oral     SpO2 11/15/19 1345 97 %     Weight 11/15/19 1325 180 lb (81.6 kg)     Height 11/15/19 1325 5\' 3"  (1.6 m)     Head Circumference --      Peak Flow --      Pain Score 11/15/19 1325 6  Pain Loc --      Pain Edu? --      Excl. in [place]? --     Constitutional: Alert and oriented. Well appearing and in no acute distress. Eyes: Conjunctivae are normal.  Head: Atraumatic. Nose: No congestion/rhinnorhea. Mouth/Throat: Mucous membranes are moist.   Neck:  supple no lymphadenopathy noted Cardiovascular: Normal rate, regular rhythm.  Respiratory: Normal respiratory effort.  No retractions,  Abd: soft tender in the right upper quadrant bs normal all 4 quad GU: deferred Musculoskeletal: FROM all extremities, warm and well perfused Neurologic:  Normal  speech and language.  Skin:  Skin is warm, dry and intact. No rash noted. Psychiatric: Mood and affect are normal. Speech and behavior are normal.  ____________________________________________   LABS (all labs ordered are listed, but only abnormal results are displayed)  Labs Reviewed  COMPREHENSIVE METABOLIC PANEL - Abnormal; Notable for the following components:      Result Value   AST 43 (*)    ALT 60 (*)    All other components within normal limits  URINALYSIS, COMPLETE (UACMP) WITH MICROSCOPIC - Abnormal; Notable for the following components:   Color, Urine YELLOW (*)    APPearance CLEAR (*)    Leukocytes,Ua TRACE (*)    All other components within normal limits  LIPASE, BLOOD  CBC   ____________________________________________   ____________________________________________  RADIOLOGY  Ultrasound right upper quadrant is negative  ____________________________________________   PROCEDURES  Procedure(s) performed: No  Procedures    ____________________________________________   INITIAL IMPRESSION / ASSESSMENT AND PLAN / ED COURSE  Pertinent labs & imaging results that were available during my care of the patient were reviewed by me and considered in my medical decision making (see chart for details).   Patient is a 29 year old female presents emergency department with sudden onset of right upper quadrant pain.  Physical exam shows right upper quadrant to be tender to palpation.  DDx cholelithiasis, acute cholecystitis, abdominal pain  CBC is normal, lipase normal, urinalysis is normal, comprehensive metabolic panel has slight elevation in AST and ALT of 43 and 60.  Due to the concerns of cholecystitis ordered ultrasound right upper quadrant.  Ultrasound right upper quadrant is negative.  Did discuss the results with patient.  She states she is feeling better now.  Therefore do not feel we need a CT at this time.  However she was given strict instructions  to return if worsening.  At that time she will need to undergo CT.  She states she understands will comply.  She is discharged stable condition.   Autumn Davies was evaluated in Emergency Department on 11/15/2019 for the symptoms described in the history of present illness. She was evaluated in the context of the global COVID-19 pandemic, which necessitated consideration that the patient might be at risk for infection with the SARS-CoV-2 virus that causes COVID-19. Institutional protocols and algorithms that pertain to the evaluation of patients at risk for COVID-19 are in a state of rapid change based on information released by regulatory bodies including the CDC and federal and state organizations. These policies and algorithms were followed during the patient's care in the ED.   As part of my medical decision making, I reviewed the following data within the [place] notes reviewed and incorporated, Labs reviewed , Old chart reviewed, Radiograph reviewed , Notes from prior ED visits and [place] Controlled Substance Database  ____________________________________________   FINAL CLINICAL IMPRESSION(S) / ED DIAGNOSES  Final diagnoses:  RUQ  pain  Acute abdominal pain in right upper quadrant      NEW MEDICATIONS STARTED DURING THIS VISIT:  Discharge Medication List as of 11/15/2019  4:11 PM       Note:  This document was prepared using Dragon voice recognition software and may include unintentional dictation errors.    Faythe Ghee, PA-C 11/15/19 1619    Shaune Pollack, MD 11/16/19 [phone]

## 2019-12-07 DIAGNOSIS — K582 Mixed irritable bowel syndrome: Secondary | ICD-10-CM | POA: Insufficient documentation

## 2020-01-14 ENCOUNTER — Other Ambulatory Visit: Payer: Self-pay

## 2020-01-14 ENCOUNTER — Encounter: Payer: Self-pay | Admitting: Obstetrics and Gynecology

## 2020-01-14 ENCOUNTER — Other Ambulatory Visit (HOSPITAL_COMMUNITY)
Admission: RE | Admit: 2020-01-14 | Discharge: 2020-01-14 | Disposition: A | Discharge status: Home / Self Care | Payer: BC Managed Care – PPO | Source: Ambulatory Visit | Attending: Obstetrics and Gynecology | Admitting: Obstetrics and Gynecology

## 2020-01-14 ENCOUNTER — Ambulatory Visit (INDEPENDENT_AMBULATORY_CARE_PROVIDER_SITE_OTHER): Payer: BC Managed Care – PPO | Admitting: Obstetrics and Gynecology

## 2020-01-14 VITALS — BP 118/74 | Ht 64.0 in | Wt 188.0 lb

## 2020-01-14 DIAGNOSIS — N809 Endometriosis, unspecified: Secondary | ICD-10-CM | POA: Diagnosis not present

## 2020-01-14 DIAGNOSIS — R1031 Right lower quadrant pain: Secondary | ICD-10-CM

## 2020-01-14 DIAGNOSIS — R87611 Atypical squamous cells cannot exclude high grade squamous intraepithelial lesion on cytologic smear of cervix (ASC-H): Secondary | ICD-10-CM | POA: Diagnosis not present

## 2020-01-14 DIAGNOSIS — R87810 Cervical high risk human papillomavirus (HPV) DNA test positive: Secondary | ICD-10-CM | POA: Insufficient documentation

## 2020-01-14 DIAGNOSIS — Z1151 Encounter for screening for human papillomavirus (HPV): Secondary | ICD-10-CM | POA: Insufficient documentation

## 2020-01-14 NOTE — Progress Notes (Signed)
Obstetrics & Gynecology Office Visit   Chief Complaint  Patient presents with  . Pelvic Pain   History of Present Illness: 29 y.o. G70P1011 female who presents for pelvic pain.  She has a history of endometriosis.  She used to have pain after and during intercourse.  She notes pain in her right lower quadrant.  Occasionally, the pain hurts across her lower abdomen. The pain isn't affected by her menstruation. The onset was several weeks ago.  She describes the pain as a throbbing sensation.  She rates the pain as a 5-6/10.  There are times when the pain is a little more.  When the pain is present, it's there pretty much all day.  On days the pain isn't as bad, there is still some discomfort and she rates this as a 2/10.  The pain started sort of all of a sudden. She no longer has the pain with intercourse.  Alleviating factors: heating pad.  Aggravating factors: nothing that she notes particularly.  Associated symptoms: denies.  She states that she had a cyst on ultrasound a couple months ago.  She does note that she has IBS, which is more on the diarrhea end of the spectrum.  More recently, she has had more constipation. She notes a little more discharge, but nothing alarmingly different.  She denies fevers, chills, vaginal symptoms. She denies urinary symptoms.    Past Medical History:  Diagnosis Date  . 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 Endometriosis determined by laparoscopy     Past Surgical History:  Procedure Laterality Date  . LAPAROSCOPY N/A 12/25/2015   Procedure: LAPAROSCOPY DIAGNOSTIC;  Surgeon: Conard Novak, MD;  Location: [facility] ORS;  Service: Gynecology;  Laterality: N/A;   Gynecologic History: Patient's last menstrual period was 12/31/2019.  Obstetric History: G2P1011  Family History  Problem Relation Age of Onset  . Charcot-Marie-Tooth disease Father   . Charcot-Marie-Tooth  disease Sister   . Heart disease Maternal Grandmother   . Hyperlipidemia Maternal Grandmother   . Heart disease Maternal Grandfather     Social History   Socioeconomic History  . Marital status: Single    Spouse name: Not on file  . Number of children: Not on file  . Years of education: Not on file  . Highest education level: Not on file  Occupational History  . Not on file  Tobacco Use  . Smoking status: Never Smoker  . Smokeless tobacco: Never Used  Substance and Sexual Activity  . Alcohol use: No  . Drug use: No  . Sexual activity: Yes    Birth control/protection: Implant  Other Topics Concern  . Not on file  Social History Narrative  . Not on file   Social Determinants of Health   Financial Resource Strain:   . Difficulty of Paying Living Expenses:   Food Insecurity:   . Worried About Programme researcher, broadcasting/film/video in the Last Year:   . Barista in the Last Year:   Transportation Needs:   . Freight forwarder (Medical):   Marland Kitchen Lack of Transportation (Non-Medical):   Physical Activity:   . Days of Exercise per Week:   . Minutes of Exercise per Session:   Stress:   . Feeling of Stress :   Social Connections:   . Frequency of Communication with Friends and Family:   . Frequency of Social Gatherings with Friends and Family:   .  Attends Religious Services:   . Active Member of Clubs or Organizations:   . Attends Archivist Meetings:   Marland Kitchen Marital Status:   Intimate Partner Violence:   . Fear of Current or Ex-Partner:   . Emotionally Abused:   Marland Kitchen Physically Abused:   . Sexually Abused:     Allergies  Allergen Reactions  . Citalopram   . Sulfa Antibiotics Rash    Prior to Admission medications   Medication Sig Start Date End Date Taking? Authorizing Provider  busPIRone (BUSPAR) 10 MG tablet Take by mouth. 01/04/20 01/03/21 Yes [provider]  doxepin (SINEQUAN) 10 MG capsule  01/05/18  Yes [provider]  gabapentin (NEURONTIN) 100 MG  capsule Take 100 mg by mouth at bedtime.   Yes [provider]  norelgestromin-ethinyl estradiol Marilu Favre) 150-35 MCG/24HR transdermal patch Place 1 patch onto the skin once a week. Use for 3 weeks, then leave off for 1 week, then start cycle over again 03/23/19  Yes Will Bonnet, MD    Review of Systems  Constitutional: Negative.   HENT: Negative.   Eyes: Negative.   Respiratory: Negative.   Cardiovascular: Negative.   Gastrointestinal: Positive for abdominal pain and constipation. Negative for blood in stool, diarrhea, heartburn, melena, nausea and vomiting.  Genitourinary: Negative.   Musculoskeletal: Negative.   Skin: Negative.   Neurological: Negative.   Psychiatric/Behavioral: Negative.      Physical Exam BP 118/74   Ht 5\' 4"  (1.626 m)   Wt 188 lb (85.3 kg)   LMP 12/31/2019   BMI 32.27 kg/m  Patient's last menstrual period was 12/31/2019. Physical Exam Constitutional:      General: She is not in acute distress.    Appearance: Normal appearance. She is well-developed.  Genitourinary:     Pelvic exam was performed with patient in the lithotomy position.     Vulva, inguinal canal, urethra, bladder, vagina, uterus, right adnexa and left adnexa normal.     No posterior fourchette tenderness, injury or lesion present.     No cervical friability, lesion, bleeding or polyp.  HENT:     Head: Normocephalic and atraumatic.  Eyes:     General: No scleral icterus.    Conjunctiva/sclera: Conjunctivae normal.  Cardiovascular:     Rate and Rhythm: Normal rate and regular rhythm.     Heart sounds: No murmur. No friction rub. No gallop.   Pulmonary:     Effort: Pulmonary effort is normal. No respiratory distress.     Breath sounds: Normal breath sounds. No wheezing or rales.  Abdominal:     General: Bowel sounds are normal. There is no distension.     Palpations: Abdomen is soft. There is no mass.     Tenderness: There is no abdominal tenderness. There is no guarding  or rebound.  Musculoskeletal:        General: Normal range of motion.     Cervical back: Normal range of motion and neck supple.  Neurological:     General: No focal deficit present.     Mental Status: She is alert and oriented to person, place, and time.     Cranial Nerves: No cranial nerve deficit.  Skin:    General: Skin is warm and dry.     Findings: No erythema.  Psychiatric:        Mood and Affect: Mood normal.        Behavior: Behavior normal.        Judgment: Judgment normal.  Female chaperone present for pelvic and breast  portions of the physical exam  Assessment: 29 y.o. G72P1011 female here for  1. Endometriosis determined by laparoscopy   2. Right lower quadrant abdominal pain      Plan: Problem List Items Addressed This Visit      Other   Endometriosis determined by laparoscopy - Primary   Relevant Orders   US PELVIS TRANSVAGINAL NON-OB (TV ONLY)   Cytology - PAP    Other Visit Diagnoses    Right lower quadrant abdominal pain       Relevant Orders   US PELVIS TRANSVAGINAL NON-OB (TV ONLY)   Cytology - PAP     Will obtain pelvic ultrasound to assess cyst or other evidence of endometriosis and other possible causes for her pain.  Pap smear performed today.  A total of 30 minutes were spent face-to-face with the patient as well as preparation, review, communication, and documentation during this encounter.    Thomasene Mohair, MD 01/14/2020 3:15 PM

## 2020-01-15 ENCOUNTER — Encounter: Payer: Self-pay | Admitting: Obstetrics and Gynecology

## 2020-01-17 LAB — CYTOLOGY - PAP
Comment: NEGATIVE
Diagnosis: HIGH — AB
High risk HPV: POSITIVE — AB

## 2020-01-18 ENCOUNTER — Ambulatory Visit (INDEPENDENT_AMBULATORY_CARE_PROVIDER_SITE_OTHER): Payer: BC Managed Care – PPO | Admitting: Obstetrics and Gynecology

## 2020-01-18 ENCOUNTER — Other Ambulatory Visit: Payer: Self-pay

## 2020-01-18 ENCOUNTER — Ambulatory Visit (INDEPENDENT_AMBULATORY_CARE_PROVIDER_SITE_OTHER): Payer: BC Managed Care – PPO

## 2020-01-18 ENCOUNTER — Encounter: Payer: Self-pay | Admitting: Obstetrics and Gynecology

## 2020-01-18 VITALS — BP 122/78 | Wt 185.0 lb

## 2020-01-18 DIAGNOSIS — R1031 Right lower quadrant pain: Secondary | ICD-10-CM | POA: Diagnosis not present

## 2020-01-18 DIAGNOSIS — N809 Endometriosis, unspecified: Secondary | ICD-10-CM

## 2020-01-18 NOTE — Progress Notes (Signed)
Gynecology Ultrasound Follow Up   Chief Complaint  Patient presents with  . Follow-up  Ultrasound for right lower quadrant pain   History of Present Illness: Patient is a 29 y.o. female who presents today for ultrasound evaluation of the above .  Ultrasound demonstrates the following findings Adnexa: no masses seen  Uterus: anteverted with endometrial stripe  2.5 mm Additional: possible component of asymmetry between anterior and posterior myometrium.  Discussed ASC-H pap smear results.  Past Medical History:  Diagnosis Date  . 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 Endometriosis determined by laparoscopy     Past Surgical History:  Procedure Laterality Date  . LAPAROSCOPY N/A 12/25/2015   Procedure: LAPAROSCOPY DIAGNOSTIC;  Surgeon: Conard Novak, MD;  Location: [facility] ORS;  Service: Gynecology;  Laterality: N/A;     Family History  Problem Relation Age of Onset  . Charcot-Marie-Tooth disease Father   . Charcot-Marie-Tooth disease Sister   . Heart disease Maternal Grandmother   . Hyperlipidemia Maternal Grandmother   . Heart disease Maternal Grandfather     Social History   Socioeconomic History  . Marital status: Single    Spouse name: Not on file  . Number of children: Not on file  . Years of education: Not on file  . Highest education level: Not on file  Occupational History  . Not on file  Tobacco Use  . Smoking status: Never Smoker  . Smokeless tobacco: Never Used  Substance and Sexual Activity  . Alcohol use: No  . Drug use: No  . Sexual activity: Yes    Birth control/protection: Implant  Other Topics Concern  . Not on file  Social History Narrative  . Not on file   Social Determinants of Health   Financial Resource Strain:   . Difficulty of Paying Living Expenses:   Food Insecurity:   . Worried About Programme researcher, broadcasting/film/video in the Last Year:   . Occupational psychologist in the Last Year:   Transportation Needs:   . Freight forwarder (Medical):   Marland Kitchen Lack of Transportation (Non-Medical):   Physical Activity:   . Days of Exercise per Week:   . Minutes of Exercise per Session:   Stress:   . Feeling of Stress :   Social Connections:   . Frequency of Communication with Friends and Family:   . Frequency of Social Gatherings with Friends and Family:   . Attends Religious Services:   . Active Member of Clubs or Organizations:   . Attends Banker Meetings:   Marland Kitchen Marital Status:   Intimate Partner Violence:   . Fear of Current or Ex-Partner:   . Emotionally Abused:   Marland Kitchen Physically Abused:   . Sexually Abused:     Allergies  Allergen Reactions  . Citalopram   . Sulfa Antibiotics Rash    Prior to Admission medications   Medication Sig Start Date End Date Taking? Authorizing Provider  busPIRone (BUSPAR) 10 MG tablet Take by mouth. 01/04/20 01/03/21  [provider]  doxepin (SINEQUAN) 10 MG capsule  01/05/18   [provider]  etonogestrel (NEXPLANON) 68 MG IMPL implant 1 each by Subdermal route once.    [provider]  gabapentin (NEURONTIN) 100 MG capsule Take 100 mg by mouth at bedtime.    [provider]  norelgestromin-ethinyl estradiol Burr Medico) 150-35 MCG/24HR transdermal patch Place 1 patch onto the  skin once a week. Use for 3 weeks, then leave off for 1 week, then start cycle over again 03/23/19   Will Bonnet, MD    Physical Exam BP 122/78   Wt 185 lb (83.9 kg)   LMP 12/31/2019   BMI 31.76 kg/m    General: NAD HEENT: normocephalic, anicteric Pulmonary: No increased work of breathing Extremities: no edema, erythema, or tenderness Neurologic: Grossly intact, normal gait Psychiatric: mood appropriate, affect full  Imaging Results US PELVIS TRANSVAGINAL NON-OB (TV ONLY)  Result Date: 01/18/2020 Patient Name: Autumn Davies DOB: 15-Jan-1991 MRN: [id number] ULTRASOUND REPORT  Location: [facility] Date of Service: 01/18/2020 Indications:Pelvic Pain Findings: The uterus is anteverted and measures 7.4 x 4.8 x 4.1 cm. Echo texture is homogenous without evidence of focal masses.  There may be a component of asymmetry to the endometrium between the anterior and posterior portions.  The Endometrium measures 2.5 mm.  Right Ovary measures 3.7 x 1.4 x 1.7 cm. It is normal in appearance. Left Ovary measures 3.0 x 1.0 x 1.0 cm. It is normal in appearance. Survey of the adnexa demonstrates no adnexal masses. There is no free fluid in the cul de sac. Impression: 1. Normal pelvic ultrasound. Gweneth Dimitri, RT The ultrasound images and findings were reviewed by me and I agree with the above report. Prentice Docker, MD, [facility], [facility] 01/18/2020 5:06 PM       Assessment: 29 y.o. G2P1011  1. Endometriosis determined by laparoscopy   2. Right lower quadrant abdominal pain      Plan: Problem List Items Addressed This Visit      Other   Endometriosis determined by laparoscopy - Primary    Other Visit Diagnoses    Right lower quadrant abdominal pain         Discussed multiple treatment options in detail, including; clinical observation, medication treatment, surgical treatment.  No evidence of obvious etiology to her pain. She does have a history of endometriosis. So, this is a culprit that is high on the list of suspected etiologies for her pain.  Can't completely rule out other systems.  She states that she and her husband are considering attempting pregnancy in the coming months and for now would like to not have the pain treated.  Will continue to monitor closely.  Her abnormal pap smear (ASC-H) was discussed in detail and colposcopy was recommended.  She voiced understanding and agreement and will schedule.  All questions answered.   A total of 25 minutes were spent face-to-face with the patient as well as preparation, review, communication,  and documentation during this encounter.    Prentice Docker, MD, [facility], [facility] 01/18/2020 5:49 PM

## 2020-01-20 ENCOUNTER — Encounter: Payer: Self-pay | Admitting: Gynecology

## 2020-01-20 ENCOUNTER — Ambulatory Visit
Admission: EM | Admit: 2020-01-20 | Discharge: 2020-01-20 | Disposition: A | Discharge status: Home / Self Care | Payer: BC Managed Care – PPO | Attending: Urgent Care | Admitting: Urgent Care

## 2020-01-20 ENCOUNTER — Other Ambulatory Visit: Payer: Self-pay

## 2020-01-20 DIAGNOSIS — R002 Palpitations: Secondary | ICD-10-CM | POA: Insufficient documentation

## 2020-01-20 DIAGNOSIS — R55 Syncope and collapse: Secondary | ICD-10-CM | POA: Insufficient documentation

## 2020-01-20 DIAGNOSIS — F418 Other specified anxiety disorders: Secondary | ICD-10-CM

## 2020-01-20 HISTORY — DX: Migraine, unspecified, not intractable, without status migrainosus: G43.909

## 2020-01-20 HISTORY — DX: Anxiety disorder, unspecified: F41.9

## 2020-01-20 LAB — URINALYSIS, COMPLETE (UACMP) WITH MICROSCOPIC
Bilirubin Urine: NEGATIVE
Glucose, UA: NEGATIVE mg/dL
Hgb urine dipstick: NEGATIVE
Ketones, ur: NEGATIVE mg/dL
Nitrite: NEGATIVE
Protein, ur: NEGATIVE mg/dL
RBC / HPF: NONE SEEN RBC/hpf (ref 0–5)
Specific Gravity, Urine: 1.025 (ref 1.005–1.030)
pH: 6.5 (ref 5.0–8.0)

## 2020-01-20 LAB — BASIC METABOLIC PANEL
Anion gap: 8 (ref 5–15)
BUN: 8 mg/dL (ref 6–20)
CO2: 26 mmol/L (ref 22–32)
Calcium: 8.9 mg/dL (ref 8.9–10.3)
Chloride: 106 mmol/L (ref 98–111)
Creatinine, Ser: 0.71 mg/dL (ref 0.44–1.00)
GFR calc Af Amer: >60 mL/min (ref >60)
GFR calc non Af Amer: >60 mL/min (ref >60)
Glucose, Bld: 93 mg/dL (ref 70–99)
Potassium: 4.3 mmol/L (ref 3.5–5.1)
Sodium: 140 mmol/L (ref 135–145)

## 2020-01-20 LAB — MAGNESIUM: Magnesium: 2 mg/dL (ref 1.7–2.4)

## 2020-01-20 LAB — CBC
HCT: 41.7 % (ref 36.0–46.0)
Hemoglobin: 13.7 g/dL (ref 12.0–15.0)
MCH: 29.5 pg (ref 26.0–34.0)
MCHC: 32.9 g/dL (ref 30.0–36.0)
MCV: 89.7 fL (ref 80.0–100.0)
Platelets: 334 10*3/uL (ref 150–400)
RBC: 4.65 MIL/uL (ref 3.87–5.11)
RDW: 13.7 % (ref 11.5–15.5)
WBC: 10.9 10*3/uL — ABNORMAL HIGH (ref 4.0–10.5)
nRBC: 0 % (ref 0.0–0.2)

## 2020-01-20 LAB — TROPONIN I (HIGH SENSITIVITY): Troponin I (High Sensitivity): <2 ng/L (ref <18)

## 2020-01-20 LAB — PREGNANCY, URINE: Preg Test, Ur: NEGATIVE

## 2020-01-20 NOTE — Discharge Instructions (Signed)
It was very nice seeing you today in clinic. Thank you for entrusting me with your care.   EKG shows a minor sinus arrhythmia; unchanged from 12/2016 at [facility]. Labs (CBC, BMP, troponin, and Mg) all normal.  Suspect vasovagal syncope. Ensure you are staying hydrated. If it recurs, I am giving you the name of a cardiologist to see. Call and make an appointment. If your symptoms/condition worsens, please seek follow up care either here or in the ER. Please remember, our [facility] providers are "right here with you" when you need [place].   Again, it was my pleasure to take care of you today. Thank you for choosing our clinic. I hope that you start to feel better quickly.   Quentin Mulling, MSN, APRN, FNP-C, CEN Advanced Practice Provider  [facility]

## 2020-01-20 NOTE — ED Triage Notes (Signed)
Per patient fainted x 6  Yesterday. Patient stated symptoms did not happen today.

## 2020-01-21 ENCOUNTER — Encounter: Payer: Self-pay | Admitting: Urgent Care

## 2020-01-21 LAB — URINE CULTURE: Culture: NO GROWTH

## 2020-01-21 NOTE — ED Provider Notes (Signed)
[place], [place]   Name: ALEJANDRIA WESSELLS DOB: 08-08-1991 MRN: [id number] CSN: [id number] PCP: Titus Mould, NP  Arrival date and time:  01/20/20 1311  Chief Complaint:  Loss of Consciousness  NOTE: Prior to seeing the patient today, I have reviewed the triage nursing documentation and vital signs. Clinical staff has updated patient's PMH/PSHx, current medication list, and drug allergies/intolerances to ensure comprehensive history available to assist in medical decision making.   History:   HPI: Sheniece Ruggles Polka is a 29 y.o. female who presents today with complaints of palpitations and multiple episodes of syncope that occurred yesterday. Patient reports that she experienced 6 brief episodes of LOC yesterday. Episodes were witnessed by her husband who reports that each episode lasted for approximately 20-30 seconds; no associated tonic/clonic activity reported. Patient reports that episodes are precipitated by her heart racing and her becoming diaphoretic. Today, patient feels fatigued and has a non-specific headache. Patient denies focal weakness, nuchal rigidity, changes to her vision, AMS, ataxia, or difficulties with her speech. LMP was on 12/31/2019 and reported to be normal; no heavy bleeding. She denies vertiginous symptoms. No recent illnesses. She denies that she has experienced any nausea, vomiting, diarrhea, or abdominal pain. She is eating and drinking well. She has not experienced any associated pain in her chest, neck, jaw, or subscapular areas. She denies pain in her LEFT upper extremity. Patient denies any known cardiac conditions. She has a PMH significant for anxiety and migraines. Patient reports that this has happened to her before in the past about 2-3 years ago, but only once; she was never evaluated following this episode. She presents today with her husband. VSS; NAD upon arrival.   Past Medical History:  Diagnosis Date  . Anxiety   . 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 Endometriosis determined by laparoscopy   . Migraines     Past Surgical History:  Procedure Laterality Date  . LAPAROSCOPY N/A 12/25/2015   Procedure: LAPAROSCOPY DIAGNOSTIC;  Surgeon: Conard Novak, MD;  Location: [facility] ORS;  Service: Gynecology;  Laterality: N/A;    Family History  Problem Relation Age of Onset  . Charcot-Marie-Tooth disease Father   . Charcot-Marie-Tooth disease Sister   . Heart disease Maternal Grandmother   . Hyperlipidemia Maternal Grandmother   . Heart disease Maternal Grandfather     Social History   Tobacco Use  . Smoking status: Never Smoker  . Smokeless tobacco: Never Used  Substance Use Topics  . Alcohol use: No  . Drug use: No    Patient Active Problem List   Diagnosis Date Noted  . Menometrorrhagia 01/12/2018  . Endometriosis determined by laparoscopy   . Pelvic pain in female 12/25/2015  . CMT (Charcot-Marie-Tooth disease) 05/29/2014    Home Medications:    Current Meds  Medication Sig  . busPIRone (BUSPAR) 10 MG tablet Take by mouth.  . norelgestromin-ethinyl estradiol Burr Medico) 150-35 MCG/24HR transdermal patch Place 1 patch onto the skin once a week. Use for 3 weeks, then leave off for 1 week, then start cycle over again  . nortriptyline (PAMELOR) 10 MG capsule TAKE 2 CAPSULES BY MOUTH AT NIGHT  . rizatriptan (MAXALT) 10 MG tablet Take 1 tab at headache onset, can repeat once in 2 hours if needed. No more than 2 tabs in 24 hours    Allergies:   Citalopram and Sulfa antibiotics  Review of Systems (ROS):  Review  of systems NEGATIVE unless otherwise noted in narrative H&P section.   Vital Signs: Today's Vitals   01/20/20 1354 01/20/20 1356 01/20/20 1543  BP: 130/80    Pulse: 92    Resp: 18    Temp: 98.6 F (37 C)    TempSrc: Oral    SpO2: 100%    Weight:  180 lb (81.6 kg)   Height:  5\' 3"  (1.6 m)   PainSc:   0-No  pain    Physical Exam: Physical Exam  Constitutional: She is oriented to person, place, and time and well-developed, well-nourished, and in no distress.  HENT:  Head: Normocephalic and atraumatic.  Eyes: Pupils are equal, round, and reactive to light.  Cardiovascular: Normal rate, regular rhythm, normal heart sounds and intact distal pulses.  Pulmonary/Chest: Effort normal and breath sounds normal.  Abdominal: Soft. Normal appearance and bowel sounds are normal. She exhibits no distension. There is no abdominal tenderness.  Musculoskeletal:     Cervical back: Normal range of motion and neck supple.  Neurological: She is alert and oriented to person, place, and time. She has normal sensation, normal strength, normal reflexes and intact cranial nerves. Gait normal. GCS score is 15.  Skin: Skin is warm and dry. No rash noted. She is not diaphoretic.  Psychiatric: Memory, affect and judgment normal. Her mood appears anxious.  Nursing note and vitals reviewed.   Urgent Care Treatments / Results:   Orders Placed This Encounter  Procedures  . Urine culture  . CBC  . Basic metabolic panel  . Magnesium  . Urinalysis, Complete w Microscopic  . Pregnancy, urine  . ED EKG  . EKG 12-Lead    LABS: PLEASE NOTE: all labs that were ordered this encounter are listed, however only abnormal results are displayed. Labs Reviewed  CBC - Abnormal; Notable for the following components:      Result Value   WBC 10.9 (*)    All other components within normal limits  URINALYSIS, COMPLETE (UACMP) WITH MICROSCOPIC - Abnormal; Notable for the following components:   Leukocytes,Ua SMALL (*)    Bacteria, UA FEW (*)    All other components within normal limits  URINE CULTURE  BASIC METABOLIC PANEL  MAGNESIUM  PREGNANCY, URINE  TROPONIN I (HIGH SENSITIVITY)    URGENT CARE ECG REPORT Date: 01/20/2020 Time ECG obtained: 1514 OM Rate: 71 bpm Rhythm: NSR with SA Axis (leads I and aVF):  normal Intervals: PR 134 ms. QTc 402 ms.  ST segment and T wave changes: No evidence of ST segment elevation or depression Comparison: Similar to previous tracing obtained on 01/16/2017 at [facility].    RADIOLOGY: No results found.  PROCEDURES: Procedures  MEDICATIONS RECEIVED THIS VISIT: Medications - No data to display  PERTINENT CLINICAL COURSE NOTES/UPDATES:   Initial Impression / Assessment and Plan / Urgent Care Course:  Pertinent labs & imaging results that were available during my care of the patient were personally reviewed by me and considered in my medical decision making (see lab/imaging section of note for values and interpretations).  Jeanmarie Mccowen Tieu is a 29 y.o. female who presents to [facility] today with complaints of Loss of Consciousness  Patient is well appearing overall in clinic today. She does not appear to be in any acute distress. Presenting symptoms (see HPI) and exam as documented above. Exam grossly benign today. She has no concerning cardiac, respiratory, or neurological findings. No significant PMH. No recent illnesses. Will pursue further workup as follows:  EKG shows NSR with SA - no change from previous.    CBC, BMP, Mg, hs-TnI all normal.    Urine hCG (-) for pregnancy.    UA remarkable for 6-10 WBC/hpf, small amount of LE, and a few bacteria. Doubt infection, however to be absolutely sure, will send for C&S. Deferring Tx until after results reviewed.   Results reviewed with patient in detail. Etiology of her symptoms unclear at this juncture. At least one of her syncopal episodes was associated with micturition/defecation giving rise to suspicion for vasovagal response. Discussed other potential etiologies as being metabolic or endocrine related, however these are less likely.  With that being said, patient is reportedly anxious. I would venture to say that symptoms represent a psychogenic/psychosomatic manifestation of her anxiety. Patient  to monitor symptoms at home. Any recurrence will need to be further evaluated starting with cardiology. I have provided her with the practice information for Dr. Julien Nordmann and encouraged to contact his office for an appointment.  I have reviewed the follow up and strict return precautions for any new or worsening symptoms. Patient is aware of symptoms that would be deemed urgent/emergent, and would thus require further evaluation either here or in the emergency department. At the time of discharge, she verbalized understanding and consent with the discharge plan as it was reviewed with her. All questions were fielded by provider and/or clinic staff prior to patient discharge.    Final Clinical Impressions / Urgent Care Diagnoses:   Final diagnoses:  Vasovagal syncope  Palpitations  Anxiety about health    New Prescriptions:  [place] Controlled Substance Registry consulted? Not Applicable  No orders of the defined types were placed in this encounter.   Recommended Follow up Care:  Patient encouraged to follow up with the following provider within the specified time frame, or sooner as dictated by the severity of her symptoms. As always, she was instructed that for any urgent/emergent care needs, she should seek care either here or in the emergency department for more immediate evaluation.  Follow-up Information    Call  Antonieta Iba, MD.   Specialty: Cardiology Why: General reassessment of symptoms if not improving Contact information: [address] [place] [address] [phone]         NOTE: This note was prepared using Dragon dictation software along with smaller phrase technology. Despite my best ability to proofread, there is the potential that transcriptional errors may still occur from this process, and are completely unintentional.    Verlee Monte, NP 01/21/20 [phone]

## 2020-01-25 ENCOUNTER — Ambulatory Visit (INDEPENDENT_AMBULATORY_CARE_PROVIDER_SITE_OTHER): Payer: BC Managed Care – PPO | Admitting: Obstetrics and Gynecology

## 2020-01-25 ENCOUNTER — Other Ambulatory Visit: Payer: Self-pay

## 2020-01-25 ENCOUNTER — Encounter: Payer: Self-pay | Admitting: Obstetrics and Gynecology

## 2020-01-25 ENCOUNTER — Other Ambulatory Visit (HOSPITAL_COMMUNITY)
Admission: RE | Admit: 2020-01-25 | Discharge: 2020-01-25 | Disposition: A | Discharge status: Home / Self Care | Payer: BC Managed Care – PPO | Source: Ambulatory Visit | Attending: Obstetrics and Gynecology | Admitting: Obstetrics and Gynecology

## 2020-01-25 VITALS — BP 122/74 | Ht 63.0 in | Wt 186.0 lb

## 2020-01-25 DIAGNOSIS — R85611 Atypical squamous cells cannot exclude high grade squamous intraepithelial lesion on cytologic smear of anus (ASC-H): Secondary | ICD-10-CM

## 2020-01-25 DIAGNOSIS — R87611 Atypical squamous cells cannot exclude high grade squamous intraepithelial lesion on cytologic smear of cervix (ASC-H): Secondary | ICD-10-CM

## 2020-01-25 NOTE — Progress Notes (Addendum)
HPI:  Autumn Davies is a 29 y.o.  G2P1011  who presents today for evaluation and management of abnormal cervical cytology.    Dysplasia History:  ASC-H pap smear recently. No prior history of abnormal pap smears    OB History  Gravida Para Term Preterm AB Living  2 1 1  0 1 1  SAB TAB Ectopic Multiple Live Births  0 0 0 0 1    # Outcome Date GA Lbr Len/2nd Weight Sex Delivery Anes PTL Lv  2 AB           1 Term     F Vag-Spont   LIV    Past Medical History:  Diagnosis Date  . Anxiety   . 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 Endometriosis determined by laparoscopy   . Migraines     Past Surgical History:  Procedure Laterality Date  . LAPAROSCOPY N/A 12/25/2015   Procedure: LAPAROSCOPY DIAGNOSTIC;  Surgeon: 12/27/2015, MD;  Location: [facility] ORS;  Service: Gynecology;  Laterality: N/A;    SOCIAL HISTORY:  Social History   Substance and Sexual Activity  Alcohol Use No    Social History   Substance and Sexual Activity  Drug Use No     Family History  Problem Relation Age of Onset  . Charcot-Marie-Tooth disease Father   . Charcot-Marie-Tooth disease Sister   . Heart disease Maternal Grandmother   . Hyperlipidemia Maternal Grandmother   . Heart disease Maternal Grandfather     ALLERGIES:  Citalopram and Sulfa antibiotics  Current Outpatient Medications on File Prior to Visit  Medication Sig Dispense Refill  . busPIRone (BUSPAR) 10 MG tablet Take by mouth.    . norelgestromin-ethinyl estradiol Conard Novak) 150-35 MCG/24HR transdermal patch Place 1 patch onto the skin once a week. Use for 3 weeks, then leave off for 1 week, then start cycle over again 9 patch 4  . nortriptyline (PAMELOR) 10 MG capsule TAKE 2 CAPSULES BY MOUTH AT NIGHT    . rizatriptan (MAXALT) 10 MG tablet Take 1 tab at headache onset, can repeat once in 2 hours if needed. No more than 2 tabs in 24  hours    . [DISCONTINUED] doxepin (SINEQUAN) 10 MG capsule   8  . [DISCONTINUED] etonogestrel (NEXPLANON) 68 MG IMPL implant 1 each by Subdermal route once.    . [DISCONTINUED] gabapentin (NEURONTIN) 100 MG capsule Take 100 mg by mouth at bedtime.     No current facility-administered medications on file prior to visit.    Physical Exam: -Vitals:  BP 122/74   Ht 5\' 3"  (1.6 m)   Wt 186 lb (84.4 kg)   LMP 12/31/2019   BMI 32.95 kg/m  GEN: WD, WN, NAD.  A+ O x 3, good mood and affect. ABD:  NT, ND.  Soft, no masses.  No hernias noted.   Pelvic:   Vulva: Normal appearance.  No lesions.  Vagina: No lesions or abnormalities noted.  Support: Normal pelvic support.  Urethra No masses tenderness or scarring.  Meatus Normal size without lesions or prolapse.  Cervix: See below.  Anus: Normal exam.  No lesions.  Perineum: Normal exam.  No lesions.        Bimanual   Uterus: Normal size.  Non-tender.  Mobile.  AV.  Adnexae: No masses.  Non-tender to palpation.  Cul-de-sac: Negative for abnormality.   PROCEDURE: 1.  Urine Pregnancy Test:  not done 2.  Colposcopy performed with 4% acetic acid after verbal consent obtained                                         -Aceto-white Lesions Location(s): mild diffuse              -Biopsy performed at 2, 4, 8, and 10 o'clock               -ECC indicated and performed: Yes.       -Biopsy sites made hemostatic with pressure and Monsel's solution   -Satisfactory colposcopy: No.    -Evidence of Invasive cervical CA :  NO  ASSESSMENT:  Autumn Davies is a 29 y.o. [id number] here for  1. 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  PLAN: I discussed the grading system of pap smears and HPV high risk viral types.  We will discuss and base management after colpo results return.     Prentice Docker, MD  [facility] 01/25/2020  12:33 PM

## 2020-01-29 LAB — SURGICAL PATHOLOGY

## 2020-02-05 ENCOUNTER — Telehealth: Payer: Self-pay | Admitting: Obstetrics and Gynecology

## 2020-02-05 NOTE — Telephone Encounter (Signed)
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 I recommend LEEP procedure and discussed the risks and benefits of this procedure, including any risks associated with future pregnancies.  We also discussed surveillance with colpo in 6 months.  She will decide and let me know.

## 2020-02-14 ENCOUNTER — Telehealth: Payer: Self-pay | Admitting: Obstetrics and Gynecology

## 2020-02-14 NOTE — Telephone Encounter (Signed)
-----   Message from Conard Novak, MD sent at 02/07/2020 11:57 PM EDT ----- Regarding: Schedule surgery Surgery Booking Request Patient Full Name:  KYLLIE PETTIJOHN  MRN: [id number]  DOB: Feb 28, 1991  Surgeon: Thomasene Mohair, MD  Requested Surgery Date and Time: TBD Primary Diagnosis AND Code: CIN III Secondary Diagnosis and Code:  Surgical Procedure: LEEP at hospital L&D Notification: No Admission Status: same day surgery Length of Surgery: 45 minutes Special Case Needs: No H&P: Yes Phone Interview???:  No Interpreter: Yes Language:  Medical Clearance:  No Special Scheduling Instructions: schedule for at hospital OR Any known health/anesthesia issues, diabetes, sleep apnea, latex allergy, defibrillator/pacemaker?: No Acuity: P3   (P1 highest, P2 delay may cause harm, P3 low, elective gyn, P4 lowest)

## 2020-02-14 NOTE — Telephone Encounter (Signed)
Pt returned call to schedule LEEP with Britt Boozer 7/20  H&P 7/6 @ 3:50 (she is on vaca 7/10-17)   Covid testing 7/19 @ 8-10:30, [address], drive up and wear mask. Advised pt to quarantine until DOS.  Pre-admit phone call appointment to be requested - date and time will be included on H&P paper work. Also all appointments will be updated on pt MyChart. Explained that this appointment has a call window. Based on the time scheduled will indicate if the call will be received within a 4 hour window before 1:00 or after.  Advised that pt may also receive calls from the hospital pharmacy and pre-service center.  Confirmed pt has [employer] as Editor, commissioning. Optimed as secondary insurance and Medicaid

## 2020-03-04 ENCOUNTER — Encounter: Payer: Self-pay | Admitting: Obstetrics and Gynecology

## 2020-03-04 ENCOUNTER — Ambulatory Visit (INDEPENDENT_AMBULATORY_CARE_PROVIDER_SITE_OTHER): Payer: PRIVATE HEALTH INSURANCE | Admitting: Obstetrics and Gynecology

## 2020-03-04 ENCOUNTER — Other Ambulatory Visit: Payer: Self-pay

## 2020-03-04 VITALS — BP 124/78 | Ht 63.0 in | Wt 187.0 lb

## 2020-03-04 DIAGNOSIS — D069 Carcinoma in situ of cervix, unspecified: Secondary | ICD-10-CM

## 2020-03-04 NOTE — Progress Notes (Signed)
Preoperative History and Physical  Autumn Davies is a 29 y.o. G2P1011 here for surgical management of CIN 2,3.   No significant preoperative concerns.  History of Present Illness: 29 y.o. G76P1011 female who presents pre-operatively for a LEEP procedure for a pap smear that returned as ASC-H, HPV+, biopsy proven CIN 2,3 on one of her biopsies. The various methods of treating this, including; clinical observation vs LEEP procedure have been discussed. She elects a LEEP procedure.   Proposed surgery: Loop Electrosurgical Excisional Procedure.  Past Medical History:  Diagnosis Date  . Anxiety   . 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 Endometriosis determined by laparoscopy   . Migraines    Past Surgical History:  Procedure Laterality Date  . LAPAROSCOPY N/A 12/25/2015   Procedure: LAPAROSCOPY DIAGNOSTIC;  Surgeon: Conard Novak, MD;  Location: [facility] ORS;  Service: Gynecology;  Laterality: N/A;   OB History  Gravida Para Term Preterm AB Living  2 1 1  0 1 1  SAB TAB Ectopic Multiple Live Births  0 0 0 0 1    # Outcome Date GA Lbr Len/2nd Weight Sex Delivery Anes PTL Lv  2 AB           1 Term     F Vag-Spont   LIV  Patient denies any other pertinent gynecologic issues.   Current Outpatient Medications on File Prior to Visit  Medication Sig Dispense Refill  . busPIRone (BUSPAR) 7.5 MG tablet Take 7.5 mg by mouth 2 (two) times daily.     gabapentin (NEURONTIN) 300 MG capsule Take 900 mg by mouth at bedtime.    . norelgestromin-ethinyl estradiol Marland Kitchen) 150-35 MCG/24HR transdermal patch Place 1 patch onto the skin once a week. Use for 3 weeks, then leave off for 1 week, then start cycle over again 9 patch 4  . nortriptyline (PAMELOR) 10 MG capsule Take 20 mg by mouth at bedtime.     Burr Medico rOPINIRole (REQUIP) 0.5 MG tablet Take 1 mg by mouth at bedtime.    . [DISCONTINUED] doxepin  (SINEQUAN) 10 MG capsule   8  . [DISCONTINUED] etonogestrel (NEXPLANON) 68 MG IMPL implant 1 each by Subdermal route once.     No current facility-administered medications on file prior to visit.   Allergies  Allergen Reactions  . Citalopram Other (See Comments)    Headache  . Sulfa Antibiotics Rash    Social History:   reports that she has never smoked. She has never used smokeless tobacco. She reports that she does not drink alcohol and does not use drugs.  Family History  Problem Relation Age of Onset  . Charcot-Marie-Tooth disease Father   . Charcot-Marie-Tooth disease Sister   . Heart disease Maternal Grandmother   . Hyperlipidemia Maternal Grandmother   . Heart disease Maternal Grandfather     Review of Systems: Noncontributory  PHYSICAL EXAM: Blood pressure 124/78, height 5\' 3"  (1.6 m), weight 187 lb (84.8 kg). CONSTITUTIONAL: Well-developed, well-nourished female in no acute distress.  HENT:  Normocephalic, atraumatic, External right and left ear normal. Oropharynx is clear and moist EYES: Conjunctivae and EOM are normal. Pupils are equal, round, and reactive to light. No scleral icterus.  NECK: Normal range of motion, supple, no masses SKIN: Skin is warm and dry. No rash noted. Not diaphoretic. No erythema. No pallor. NEUROLGIC: Alert and oriented to person, place, and time. Normal reflexes, muscle  tone coordination. No cranial nerve deficit noted. PSYCHIATRIC: Normal mood and affect. Normal behavior. Normal judgment and thought content. CARDIOVASCULAR: Normal heart rate noted, regular rhythm RESPIRATORY: Effort and breath sounds normal, no problems with respiration noted ABDOMEN: Soft, nontender, nondistended. PELVIC: Deferred MUSCULOSKELETAL: Normal range of motion. No edema and no tenderness. 2+ distal pulses.  Assessment: 1. CIN III (cervical intraepithelial neoplasia grade III) with severe dysplasia      Plan: Patient will undergo surgical management with  LEEP.   The risks of surgery were discussed in detail with the patient including but not limited to: bleeding which may require transfusion or reoperation; infection which may require antibiotics; injury to surrounding organs which may involve bowel, bladder, ureters ; need for additional procedures including laparoscopy or laparotomy; thromboembolic phenomenon, surgical site problems and other postoperative/anesthesia complications. Likelihood of success in alleviating the patient's condition was discussed. Routine postoperative instructions will be reviewed with the patient and her family in detail after surgery.  The patient concurred with the proposed plan, giving informed written consent for the surgery.  Preoperative prophylactic antibiotics, as indicated, and SCDs ordered on call to the OR.    Autumn Gunia, MD 03/04/2020 3:55 PM    

## 2020-03-04 NOTE — H&P (View-Only) (Signed)
Preoperative History and Physical  Autumn Davies is a 29 y.o. G2P1011 here for surgical management of CIN 2,3.   No significant preoperative concerns.  History of Present Illness: 29 y.o. G76P1011 female who presents pre-operatively for a LEEP procedure for a pap smear that returned as ASC-H, HPV+, biopsy proven CIN 2,3 on one of her biopsies. The various methods of treating this, including; clinical observation vs LEEP procedure have been discussed. She elects a LEEP procedure.   Proposed surgery: Loop Electrosurgical Excisional Procedure.  Past Medical History:  Diagnosis Date  . Anxiety   . 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 Endometriosis determined by laparoscopy   . Migraines    Past Surgical History:  Procedure Laterality Date  . LAPAROSCOPY N/A 12/25/2015   Procedure: LAPAROSCOPY DIAGNOSTIC;  Surgeon: Conard Novak, MD;  Location: [facility] ORS;  Service: Gynecology;  Laterality: N/A;   OB History  Gravida Para Term Preterm AB Living  2 1 1  0 1 1  SAB TAB Ectopic Multiple Live Births  0 0 0 0 1    # Outcome Date GA Lbr Len/2nd Weight Sex Delivery Anes PTL Lv  2 AB           1 Term     F Vag-Spont   LIV  Patient denies any other pertinent gynecologic issues.   Current Outpatient Medications on File Prior to Visit  Medication Sig Dispense Refill  . busPIRone (BUSPAR) 7.5 MG tablet Take 7.5 mg by mouth 2 (two) times daily.     gabapentin (NEURONTIN) 300 MG capsule Take 900 mg by mouth at bedtime.    . norelgestromin-ethinyl estradiol Marland Kitchen) 150-35 MCG/24HR transdermal patch Place 1 patch onto the skin once a week. Use for 3 weeks, then leave off for 1 week, then start cycle over again 9 patch 4  . nortriptyline (PAMELOR) 10 MG capsule Take 20 mg by mouth at bedtime.     Burr Medico rOPINIRole (REQUIP) 0.5 MG tablet Take 1 mg by mouth at bedtime.    . [DISCONTINUED] doxepin  (SINEQUAN) 10 MG capsule   8  . [DISCONTINUED] etonogestrel (NEXPLANON) 68 MG IMPL implant 1 each by Subdermal route once.     No current facility-administered medications on file prior to visit.   Allergies  Allergen Reactions  . Citalopram Other (See Comments)    Headache  . Sulfa Antibiotics Rash    Social History:   reports that she has never smoked. She has never used smokeless tobacco. She reports that she does not drink alcohol and does not use drugs.  Family History  Problem Relation Age of Onset  . Charcot-Marie-Tooth disease Father   . Charcot-Marie-Tooth disease Sister   . Heart disease Maternal Grandmother   . Hyperlipidemia Maternal Grandmother   . Heart disease Maternal Grandfather     Review of Systems: Noncontributory  PHYSICAL EXAM: Blood pressure 124/78, height 5\' 3"  (1.6 m), weight 187 lb (84.8 kg). CONSTITUTIONAL: Well-developed, well-nourished female in no acute distress.  HENT:  Normocephalic, atraumatic, External right and left ear normal. Oropharynx is clear and moist EYES: Conjunctivae and EOM are normal. Pupils are equal, round, and reactive to light. No scleral icterus.  NECK: Normal range of motion, supple, no masses SKIN: Skin is warm and dry. No rash noted. Not diaphoretic. No erythema. No pallor. NEUROLGIC: Alert and oriented to person, place, and time. Normal reflexes, muscle  tone coordination. No cranial nerve deficit noted. PSYCHIATRIC: Normal mood and affect. Normal behavior. Normal judgment and thought content. CARDIOVASCULAR: Normal heart rate noted, regular rhythm RESPIRATORY: Effort and breath sounds normal, no problems with respiration noted ABDOMEN: Soft, nontender, nondistended. PELVIC: Deferred MUSCULOSKELETAL: Normal range of motion. No edema and no tenderness. 2+ distal pulses.  Assessment: 1. CIN III (cervical intraepithelial neoplasia grade III) with severe dysplasia      Plan: Patient will undergo surgical management with  LEEP.   The risks of surgery were discussed in detail with the patient including but not limited to: bleeding which may require transfusion or reoperation; infection which may require antibiotics; injury to surrounding organs which may involve bowel, bladder, ureters ; need for additional procedures including laparoscopy or laparotomy; thromboembolic phenomenon, surgical site problems and other postoperative/anesthesia complications. Likelihood of success in alleviating the patient's condition was discussed. Routine postoperative instructions will be reviewed with the patient and her family in detail after surgery.  The patient concurred with the proposed plan, giving informed written consent for the surgery.  Preoperative prophylactic antibiotics, as indicated, and SCDs ordered on call to the OR.    Thomasene Mohair, MD 03/04/2020 3:55 PM

## 2020-03-10 ENCOUNTER — Other Ambulatory Visit: Payer: Self-pay

## 2020-03-10 ENCOUNTER — Encounter
Admission: RE | Admit: 2020-03-10 | Discharge: 2020-03-10 | Disposition: A | Discharge status: Home / Self Care | Payer: PRIVATE HEALTH INSURANCE | Source: Ambulatory Visit | Attending: Obstetrics and Gynecology | Admitting: Obstetrics and Gynecology

## 2020-03-10 NOTE — Patient Instructions (Addendum)
Your procedure is scheduled on: Tuesday 03/18/20  Report to DAY SURGERY DEPARTMENT LOCATED ON 2ND FLOOR MEDICAL MALL ENTRANCE. To find out your arrival time please call [phone] between 1PM - 3PM on Monday 03/17/20.   Remember: Instructions that are not followed completely may result in serious medical risk, up to and including death, or upon the discretion of your surgeon and anesthesiologist your surgery may need to be rescheduled.     __X__ 1. Do not eat food after midnight the night before your procedure.                 No gum chewing or hard candies. You may drink clear liquids up to 2 hours                 before you are scheduled to arrive for your surgery- DO NOT drink clear                 liquids within 2 hours of the start of your surgery.                 Clear Liquids include:  water, apple juice without pulp, clear carbohydrate                 drink such as Clearfast or Gatorade, Black Coffee or Tea (Do not add                 milk or creamer to coffee or tea).   ** Dr. Jean Rosenthal has ordered a Clear Pre-Surgery Ensure for you to drink 2 hours before your arrival time on the morning of surgery. **   __X__2.  On the morning of surgery brush your teeth with toothpaste and water, you may rinse your mouth with mouthwash if you wish.  Do not swallow any toothpaste or mouthwash.     __X__ 3.  No Alcohol for 24 hours before or after surgery.    __X__ 4.  Do Not Smoke or use e-cigarettes For 24 Hours Prior to Your Surgery.                 Do not use any chewable tobacco products for at least 6 hours prior to                 surgery.   __X__5.  Notify your doctor if there is any change in your medical condition      (cold, fever, infections).      Do NOT wear jewelry, make-up, hairpins, clips or nail polish. Do NOT wear lotions, powders, or perfumes.  Do NOT shave 48 hours prior to surgery. Men may shave face and neck. Do NOT bring valuables to the hospital.     [facility] is not responsible for any belongings or valuables.   Contacts, dentures/partials or body piercings may not be worn into surgery. Bring a case for your contacts, glasses or hearing aids, a denture cup will be supplied.    For patients admitted to the hospital, discharge time is determined by your treatment team.    Patients discharged the day of surgery will not be allowed to drive home.     __X__ Take these medicines the morning of surgery with A SIP OF WATER:     1. busPIRone (BUSPAR)      __X__ Stop Anti-inflammatories 7 days before surgery such as Advil, Ibuprofen, Motrin, BC or Goodies Powder, Naprosyn, Naproxen, Aleve, Aspirin, Meloxicam. May take Tylenol if needed for pain  or discomfort.    __X__Do not start taking any new herbal supplements or vitamins prior to your procedure.     Wear comfortable clothing (specific to your surgery type) to the hospital.   Plan for stool softeners for home use; pain medications have a tendency to cause constipation. You can also help prevent constipation by eating foods high in fiber such as fruits and vegetables and drinking plenty of fluids as your diet allows.   After surgery, you can prevent lung complications by doing breathing exercises.Take deep breaths and cough every 1-2 hours. Your doctor may order a device called an Incentive Spirometer to help you take deep breaths.   Please call the Pre-Admissions Testing Department at [phone] if you have any questions about these instructions.

## 2020-03-17 ENCOUNTER — Encounter: Payer: Self-pay | Admitting: Urgent Care

## 2020-03-17 ENCOUNTER — Other Ambulatory Visit
Admission: RE | Admit: 2020-03-17 | Discharge: 2020-03-17 | Disposition: A | Discharge status: Home / Self Care | Payer: BC Managed Care – PPO | Source: Ambulatory Visit | Attending: Obstetrics and Gynecology | Admitting: Obstetrics and Gynecology

## 2020-03-17 ENCOUNTER — Other Ambulatory Visit: Payer: Self-pay

## 2020-03-17 DIAGNOSIS — Z20822 Contact with and (suspected) exposure to covid-19: Secondary | ICD-10-CM | POA: Insufficient documentation

## 2020-03-17 DIAGNOSIS — Z01812 Encounter for preprocedural laboratory examination: Secondary | ICD-10-CM | POA: Insufficient documentation

## 2020-03-17 DIAGNOSIS — D069 Carcinoma in situ of cervix, unspecified: Secondary | ICD-10-CM | POA: Insufficient documentation

## 2020-03-17 LAB — SARS CORONAVIRUS 2 (TAT 6-24 HRS): SARS Coronavirus 2: NEGATIVE

## 2020-03-18 ENCOUNTER — Ambulatory Visit: Payer: BC Managed Care – PPO | Admitting: Certified Registered Nurse Anesthetist

## 2020-03-18 ENCOUNTER — Encounter: Payer: Self-pay | Admitting: Obstetrics and Gynecology

## 2020-03-18 ENCOUNTER — Encounter
Admission: RE | Disposition: A | Discharge status: Home / Self Care | Payer: Self-pay | Source: Home / Self Care | Attending: Obstetrics and Gynecology

## 2020-03-18 ENCOUNTER — Other Ambulatory Visit: Payer: Self-pay

## 2020-03-18 ENCOUNTER — Ambulatory Visit
Admission: RE | Admit: 2020-03-18 | Discharge: 2020-03-18 | Disposition: A | Discharge status: Home / Self Care | Payer: BC Managed Care – PPO | Attending: Obstetrics and Gynecology | Admitting: Obstetrics and Gynecology

## 2020-03-18 DIAGNOSIS — D069 Carcinoma in situ of cervix, unspecified: Secondary | ICD-10-CM | POA: Diagnosis not present

## 2020-03-18 DIAGNOSIS — Z79899 Other long term (current) drug therapy: Secondary | ICD-10-CM | POA: Insufficient documentation

## 2020-03-18 DIAGNOSIS — D061 Carcinoma in situ of exocervix: Secondary | ICD-10-CM | POA: Insufficient documentation

## 2020-03-18 DIAGNOSIS — Z820 Family history of epilepsy and other diseases of the nervous system: Secondary | ICD-10-CM | POA: Diagnosis not present

## 2020-03-18 DIAGNOSIS — G600 Hereditary motor and sensory neuropathy: Secondary | ICD-10-CM | POA: Diagnosis not present

## 2020-03-18 DIAGNOSIS — Z8249 Family history of ischemic heart disease and other diseases of the circulatory system: Secondary | ICD-10-CM | POA: Insufficient documentation

## 2020-03-18 DIAGNOSIS — Z793 Long term (current) use of hormonal contraceptives: Secondary | ICD-10-CM | POA: Diagnosis not present

## 2020-03-18 DIAGNOSIS — F419 Anxiety disorder, unspecified: Secondary | ICD-10-CM | POA: Insufficient documentation

## 2020-03-18 DIAGNOSIS — Z882 Allergy status to sulfonamides status: Secondary | ICD-10-CM | POA: Insufficient documentation

## 2020-03-18 DIAGNOSIS — G2581 Restless legs syndrome: Secondary | ICD-10-CM | POA: Insufficient documentation

## 2020-03-18 DIAGNOSIS — Z888 Allergy status to other drugs, medicaments and biological substances status: Secondary | ICD-10-CM | POA: Diagnosis not present

## 2020-03-18 HISTORY — PX: LEEP: SHX91

## 2020-03-18 LAB — TYPE AND SCREEN
ABO/RH(D): O POS
Antibody Screen: NEGATIVE

## 2020-03-18 LAB — POCT PREGNANCY, URINE: Preg Test, Ur: NEGATIVE

## 2020-03-18 SURGERY — LEEP (LOOP ELECTROSURGICAL EXCISION PROCEDURE)
Anesthesia: General | Site: Vagina

## 2020-03-18 MED ORDER — GLYCOPYRROLATE 0.2 MG/ML IJ SOLN
INTRAMUSCULAR | Status: DC | PRN
  Administered 2020-03-18: .2 mg via INTRAVENOUS

## 2020-03-18 MED ORDER — ORAL CARE MOUTH RINSE: 15.0000 mL | Freq: Once | OROMUCOSAL | Status: AC

## 2020-03-18 MED ORDER — FAMOTIDINE 20 MG PO TABS: 20.0000 mg | ORAL_TABLET | Freq: Once | ORAL | Status: AC

## 2020-03-18 MED ORDER — HYDROCODONE-ACETAMINOPHEN 5-325 MG PO TABS: 1.0000 | ORAL_TABLET | Freq: Four times a day (QID) | ORAL | 0 refills | Status: DC | PRN

## 2020-03-18 MED ORDER — LIDOCAINE HCL (PF) 2 % IJ SOLN
INTRAMUSCULAR | Status: AC
  Filled 2020-03-18: qty 5

## 2020-03-18 MED ORDER — PHENYLEPHRINE HCL (PRESSORS) 10 MG/ML IV SOLN
INTRAVENOUS | Status: AC
  Filled 2020-03-18: qty 1

## 2020-03-18 MED ORDER — FENTANYL CITRATE (PF) 100 MCG/2ML IJ SOLN
INTRAMUSCULAR | Status: AC
  Administered 2020-03-18: 25 ug via INTRAVENOUS
  Filled 2020-03-18: qty 2

## 2020-03-18 MED ORDER — FENTANYL CITRATE (PF) 100 MCG/2ML IJ SOLN
25.0000 ug | INTRAMUSCULAR | Status: DC | PRN
  Administered 2020-03-18 (×3): 25 ug via INTRAVENOUS

## 2020-03-18 MED ORDER — ONDANSETRON HCL 4 MG/2ML IJ SOLN: 4.0000 mg | Freq: Once | INTRAMUSCULAR | Status: DC | PRN

## 2020-03-18 MED ORDER — DEXAMETHASONE SODIUM PHOSPHATE 10 MG/ML IJ SOLN
INTRAMUSCULAR | Status: DC | PRN
  Administered 2020-03-18: 10 mg via INTRAVENOUS

## 2020-03-18 MED ORDER — FERRIC SUBSULFATE 259 MG/GM EX SOLN
CUTANEOUS | Status: DC | PRN
  Administered 2020-03-18: 1

## 2020-03-18 MED ORDER — IBUPROFEN 600 MG PO TABS: 600.0000 mg | ORAL_TABLET | Freq: Four times a day (QID) | ORAL | 0 refills | Status: DC | PRN

## 2020-03-18 MED ORDER — LIDOCAINE-EPINEPHRINE 1 %-1:100000 IJ SOLN
INTRAMUSCULAR | Status: AC
  Filled 2020-03-18: qty 1

## 2020-03-18 MED ORDER — IODINE STRONG (LUGOLS) 5 % PO SOLN
ORAL | Status: DC | PRN
  Administered 2020-03-18: 0.2 mL

## 2020-03-18 MED ORDER — CHLORHEXIDINE GLUCONATE 0.12 % MT SOLN: 15.0000 mL | Freq: Once | OROMUCOSAL | Status: AC

## 2020-03-18 MED ORDER — MIDAZOLAM HCL 2 MG/2ML IJ SOLN
INTRAMUSCULAR | Status: AC
  Filled 2020-03-18: qty 2

## 2020-03-18 MED ORDER — DEXMEDETOMIDINE HCL IN NACL 80 MCG/20ML IV SOLN
INTRAVENOUS | Status: AC
  Filled 2020-03-18: qty 20

## 2020-03-18 MED ORDER — LIDOCAINE-EPINEPHRINE 1 %-1:100000 IJ SOLN
INTRAMUSCULAR | Status: DC | PRN
  Administered 2020-03-18: 7 mL

## 2020-03-18 MED ORDER — PHENYLEPHRINE HCL (PRESSORS) 10 MG/ML IV SOLN
INTRAVENOUS | Status: DC | PRN
  Administered 2020-03-18: 100 ug via INTRAVENOUS

## 2020-03-18 MED ORDER — LIDOCAINE HCL (CARDIAC) PF 100 MG/5ML IV SOSY
PREFILLED_SYRINGE | INTRAVENOUS | Status: DC | PRN
  Administered 2020-03-18: 100 mg via INTRAVENOUS

## 2020-03-18 MED ORDER — HYDROCODONE-ACETAMINOPHEN 5-325 MG PO TABS
1.0000 | ORAL_TABLET | Freq: Four times a day (QID) | ORAL | Status: DC | PRN
  Administered 2020-03-18: 1 via ORAL

## 2020-03-18 MED ORDER — FAMOTIDINE 20 MG PO TABS
ORAL_TABLET | ORAL | Status: AC
  Administered 2020-03-18: 20 mg via ORAL
  Filled 2020-03-18: qty 1

## 2020-03-18 MED ORDER — FENTANYL CITRATE (PF) 100 MCG/2ML IJ SOLN
INTRAMUSCULAR | Status: AC
  Filled 2020-03-18: qty 2

## 2020-03-18 MED ORDER — MIDAZOLAM HCL 2 MG/2ML IJ SOLN
INTRAMUSCULAR | Status: DC | PRN
  Administered 2020-03-18: 2 mg via INTRAVENOUS

## 2020-03-18 MED ORDER — PROPOFOL 10 MG/ML IV BOLUS
INTRAVENOUS | Status: AC
  Filled 2020-03-18: qty 20

## 2020-03-18 MED ORDER — ONDANSETRON HCL 4 MG/2ML IJ SOLN
INTRAMUSCULAR | Status: DC | PRN
  Administered 2020-03-18: 4 mg via INTRAVENOUS

## 2020-03-18 MED ORDER — CHLORHEXIDINE GLUCONATE 0.12 % MT SOLN
OROMUCOSAL | Status: AC
  Administered 2020-03-18: 15 mL via OROMUCOSAL
  Filled 2020-03-18: qty 15

## 2020-03-18 MED ORDER — LACTATED RINGERS IV SOLN: INTRAVENOUS | Status: DC

## 2020-03-18 MED ORDER — DEXMEDETOMIDINE HCL 200 MCG/2ML IV SOLN
INTRAVENOUS | Status: DC | PRN
  Administered 2020-03-18 (×3): 4 ug via INTRAVENOUS

## 2020-03-18 MED ORDER — FENTANYL CITRATE (PF) 100 MCG/2ML IJ SOLN
INTRAMUSCULAR | Status: DC | PRN
  Administered 2020-03-18 (×2): 25 ug via INTRAVENOUS

## 2020-03-18 MED ORDER — PROPOFOL 10 MG/ML IV BOLUS
INTRAVENOUS | Status: DC | PRN
  Administered 2020-03-18: 200 mg via INTRAVENOUS

## 2020-03-18 SURGICAL SUPPLY — 39 items
APL SWBSTK 6 STRL LF DISP (MISCELLANEOUS) ×3
APPLICATOR COTTON TIP 6 STRL (MISCELLANEOUS) ×3 IMPLANT
APPLICATOR COTTON TIP 6IN STRL (MISCELLANEOUS) ×9
APPLICATOR SWAB PROCTO LG 16IN (MISCELLANEOUS) ×9 IMPLANT
CANISTER SUCT 1200ML W/VALVE (MISCELLANEOUS) ×3 IMPLANT
CATH ROBINSON RED A/P 16FR (CATHETERS) ×3 IMPLANT
CLEANER CAUTERY TIP 5X5 PAD (MISCELLANEOUS) ×1 IMPLANT
CNTNR SPEC 2.5X3XGRAD LEK (MISCELLANEOUS) ×1
CONT SPEC 4OZ STER OR WHT (MISCELLANEOUS) ×2
CONT SPEC 4OZ STRL OR WHT (MISCELLANEOUS) ×1
CONTAINER SPEC 2.5X3XGRAD LEK (MISCELLANEOUS) ×1 IMPLANT
COVER WAND RF STERILE (DRAPES) ×3 IMPLANT
ELECT LEEP BALL 5MM 12CM (MISCELLANEOUS) ×3
ELECT LEEP LOOP 20X10 R2010 (MISCELLANEOUS) ×3
ELECT LOOP 1.0X1.0CM R1010 (MISCELLANEOUS) ×3
ELECT REM PT RETURN 9FT ADLT (ELECTROSURGICAL) ×3
ELECTRODE LEEP BALL 5MM 12CM (MISCELLANEOUS) ×1 IMPLANT
ELECTRODE LEP LOOP 20X10 R2010 (MISCELLANEOUS) ×1 IMPLANT
ELECTRODE LOOP 1.0X1.0CM R1010 (MISCELLANEOUS) ×1 IMPLANT
ELECTRODE REM PT RTRN 9FT ADLT (ELECTROSURGICAL) ×1 IMPLANT
GLOVE BIO SURGEON STRL SZ7 (GLOVE) ×3 IMPLANT
GLOVE BIOGEL PI IND STRL 7.5 (GLOVE) ×1 IMPLANT
GLOVE BIOGEL PI INDICATOR 7.5 (GLOVE) ×2
GOWN STRL REUS W/ TWL LRG LVL3 (GOWN DISPOSABLE) ×2 IMPLANT
GOWN STRL REUS W/TWL LRG LVL3 (GOWN DISPOSABLE) ×6
KIT TURNOVER CYSTO (KITS) ×3 IMPLANT
NEEDLE SPNL 22GX3.5 QUINCKE BK (NEEDLE) ×3 IMPLANT
NS IRRIG 500ML POUR BTL (IV SOLUTION) ×3 IMPLANT
PACK DNC HYST (MISCELLANEOUS) ×3 IMPLANT
PAD CLEANER CAUTERY TIP 5X5 (MISCELLANEOUS) ×2
PAD OB MATERNITY 4.3X12.25 (PERSONAL CARE ITEMS) ×3 IMPLANT
PAD PREP 24X41 OB/GYN DISP (PERSONAL CARE ITEMS) ×3 IMPLANT
PENCIL ELECTRO HAND CTR (MISCELLANEOUS) ×3 IMPLANT
STRAW SMOKE EVAC LEEP 6150 NON (MISCELLANEOUS) ×3 IMPLANT
SYR 10ML LL (SYRINGE) IMPLANT
SYR CONTROL 10ML LL (SYRINGE) ×3 IMPLANT
TOWEL OR 17X26 4PK STRL BLUE (TOWEL DISPOSABLE) ×3 IMPLANT
TUBING CONNECTING 10 (TUBING) ×2 IMPLANT
TUBING CONNECTING 10' (TUBING) ×1

## 2020-03-18 NOTE — Discharge Instructions (Signed)

## 2020-03-18 NOTE — Anesthesia Preprocedure Evaluation (Addendum)
Anesthesia Evaluation  Patient identified by MRN, date of birth, ID band Patient awake    Reviewed: Allergy & Precautions, NPO status , Patient's Chart, lab work & pertinent test results  History of Anesthesia Complications Negative for: history of anesthetic complications  Airway Mallampati: II       Dental   Pulmonary neg sleep apnea, neg COPD, Not current smoker,           Cardiovascular (-) hypertension(-) Past MI and (-) CHF (-) dysrhythmias (-) Valvular Problems/Murmurs     Neuro/Psych neg Seizures Anxiety  Neuromuscular disease (Charcot-Marie-Tooth dz, leg weakness)    GI/Hepatic Neg liver ROS, neg GERD  ,  Endo/Other  neg diabetes  Renal/GU negative Renal ROS     Musculoskeletal   Abdominal   Peds  Hematology   Anesthesia Other Findings   Reproductive/Obstetrics                             Anesthesia Physical Anesthesia Plan  ASA: III  Anesthesia Plan: General   Post-op Pain Management:    Induction: Intravenous  PONV Risk Score and Plan: 3 and Ondansetron, Dexamethasone and Midazolam  Airway Management Planned: LMA  Additional Equipment:   Intra-op Plan:   Post-operative Plan:   Informed Consent: I have reviewed the patients History and Physical, chart, labs and discussed the procedure including the risks, benefits and alternatives for the proposed anesthesia with the patient or authorized representative who has indicated his/her understanding and acceptance.       Plan Discussed with:   Anesthesia Plan Comments:        Anesthesia Quick Evaluation

## 2020-03-18 NOTE — Op Note (Signed)
°  Operative Note    Name: Autumn Davies  Date of Service: 03/18/2020  DOB: November 23, 1990  MRN: [id number]   Pre-Operative Diagnosis:  CIN III [D06.9]  Post-Operative Diagnosis: CIN III [D06.9]  Procedures: Loop Electrosurgical Excisional Procedure  Primary Surgeon: Thomasene Mohair, MD   EBL: 20 mL   IVF: 600 mL   Urine output: 75 mL  Specimens:  1) Ectocervix 2) Endocervix 3) Endocervical curettings  Drains: none  Complications: None   Disposition: PACU   Condition: Stable   Findings: Grossly normal appearing cervix, Lugol's negative in the columnar mucosa  Procedure Summary:  The LEEP has been explained to the patient in detail; risks/benefits reviewed.  The risks include, but are not limited to, bleeding, infection, and the possibility of cervical stenosis or cervical incompetence.  The patient had previously been given information regarding abnormal PAP smears and their relationship to HPV.  We have discussed the natural course and history of HPV, the possibility of incomplete treatment by the LEEP, as well as the possibility of recurrence.  I have reviewed the consent form for LEEP with her, and she fully understands its contents.  We have discussed the procedure itself. I have informed her that following the LEEP she should refrain from intercourse and the use of tampons for three weeks, and that she should also expect some spotting and brown/black discharge over the next several days.  We have discussed the fact that vaginal bleeding, differentiated from spotting, is not normal and that if she should have this complication, she should contact me immediately.  The follow-up after LEEP will be PAP smears or viral typing performed at regular intervals for up to 3-5 years.  Should these all prove to be normal, she will then be back on typical cervical screening.  I have answered all of her questions, and I believe she has an adequate understanding of the LEEP, its  implications, and the necessity of follow-up care.  I discussed her colpo results and explained the procedure of LEEP.  All questions were answered and she signed the consent form.    The patient was taken to the operating room and general anesthesia was induced without difficulty. The was positioned in the dorsal, supine lithotomy position in candy cane stirrups. She was prepped and draped in the usual, sterile fashion.    After a timeout was called, an in-and-out catheterization was performed.  A sterile, insulated speculum was placed in the vagina.  Lugol's solution was usesd to identify any abnormal areas of the cervix.  The cervix had been previously prepped for sterility.   Local injection of Lidocaine with epinephrine was performed for anesthesia. Ectocervical and then endocervical specimens obtained using the loop electrodes without difficulty.  An ECC was performed.  Each specimen was labeled accordingly.  The base and edges of the defect was then cauterized using coagulation current.  Hemostasis achieved.  Monsel's solution was applied to maintain hemostasis.   This concluded the procedure.  The vagina was examined to ensure no remaining instruments or sponges. The speculum was removed.    The patient tolerated the procedure well.  Sponge, lap, needle, and instrument counts were correct x 2.  VTE prophylaxis: SCDs. Antibiotic prophylaxis: none indicated, none given. She was awakened in the operating room and was taken to the PACU in stable condition.   Thomasene Mohair, MD 03/18/2020 10:01 AM

## 2020-03-18 NOTE — Transfer of Care (Signed)
Immediate Anesthesia Transfer of Care Note  Patient: Autumn Davies  Procedure(s) Performed: LOOP ELECTROSURGICAL EXCISION PROCEDURE (LEEP) (N/A Vagina )  Patient Location: PACU  Anesthesia Type:General  Level of Consciousness: drowsy  Airway & Oxygen Therapy: Patient Spontanous Breathing and Patient connected to face mask oxygen  Post-op Assessment: Report given to RN and Post -op Vital signs reviewed and stable  Post vital signs: Reviewed and stable  Last Vitals:  Vitals Value Taken Time  BP 117/74 03/18/20 1017  Temp 36.2 C 03/18/20 1017  Pulse 104 03/18/20 1019  Resp 14 03/18/20 1019  SpO2 100 % 03/18/20 1019  Vitals shown include unvalidated device data.  Last Pain:  Vitals:   03/18/20 1017  PainSc: (P) Asleep         Complications: No complications documented.

## 2020-03-18 NOTE — Anesthesia Procedure Notes (Signed)
Procedure Name: LMA Insertion Date/Time: 03/18/2020 9:26 AM Performed by: Joanette Gula, Sayward Horvath, RN Pre-anesthesia Checklist: Patient identified, Emergency Drugs available, Suction available and Patient being monitored Patient Re-evaluated:Patient Re-evaluated prior to induction Oxygen Delivery Method: Circle system utilized Preoxygenation: Pre-oxygenation with 100% oxygen Induction Type: IV induction Ventilation: Mask ventilation without difficulty LMA: LMA inserted LMA Size: 5.0 Number of attempts: 1 Placement Confirmation: breath sounds checked- equal and bilateral and positive ETCO2 Tube secured with: Tape Dental Injury: Teeth and Oropharynx as per pre-operative assessment

## 2020-03-18 NOTE — Interval H&P Note (Signed)
History and Physical Interval Note:  03/18/2020 9:05 AM  Autumn Davies  has presented today for surgery, with the diagnosis of CIN III.  The various methods of treatment have been discussed with the patient and family. After consideration of risks, benefits and other options for treatment, the patient has consented to  Procedure(s): LOOP ELECTROSURGICAL EXCISION PROCEDURE (LEEP) (N/A) as a surgical intervention.  The patient's history has been reviewed, patient examined, no change in status, stable for surgery.  I have reviewed the patient's chart and labs.  Questions were answered to the patient's satisfaction.  Consent reviewed. Patient agrees to proceed.   Thomasene Mohair, MD, [facility], [facility] 03/18/2020 9:05 AM

## 2020-03-18 NOTE — Anesthesia Postprocedure Evaluation (Signed)
Anesthesia Post Note  Patient: Autumn Davies  Procedure(s) Performed: LOOP ELECTROSURGICAL EXCISION PROCEDURE (LEEP) (N/A Vagina )  Patient location during evaluation: PACU Anesthesia Type: General Level of consciousness: awake and alert Pain management: pain level controlled Vital Signs Assessment: post-procedure vital signs reviewed and stable Respiratory status: spontaneous breathing and respiratory function stable Cardiovascular status: stable Anesthetic complications: no   No complications documented.   Last Vitals:  Vitals:   03/18/20 1058 03/18/20 1109  BP: 109/72 121/79  Pulse: 97 78  Resp: 10 14  Temp: 36.7 C 36.7 C  SpO2: 100% 100%    Last Pain:  Vitals:   03/18/20 1109  TempSrc: Temporal  PainSc: 3                  [name] K

## 2020-03-19 ENCOUNTER — Encounter: Payer: Self-pay | Admitting: Obstetrics and Gynecology

## 2020-03-20 ENCOUNTER — Telehealth: Payer: Self-pay | Admitting: Obstetrics and Gynecology

## 2020-03-20 NOTE — Telephone Encounter (Signed)
Pt calling about FMLA and has questions. Please give her a call.

## 2020-03-20 NOTE — Telephone Encounter (Signed)
Pt aware I faxed paperwork and received confirmation. She will call back with a different fax number if needed

## 2020-03-21 ENCOUNTER — Telehealth: Payer: Self-pay | Admitting: Obstetrics and Gynecology

## 2020-03-21 LAB — SURGICAL PATHOLOGY

## 2020-03-21 NOTE — Telephone Encounter (Signed)
Discussed LEEP results.  All questions answered. Encouraged to keep follow up visit

## 2020-03-22 ENCOUNTER — Other Ambulatory Visit: Payer: Self-pay | Admitting: Obstetrics and Gynecology

## 2020-03-22 DIAGNOSIS — D069 Carcinoma in situ of cervix, unspecified: Secondary | ICD-10-CM

## 2020-04-09 ENCOUNTER — Other Ambulatory Visit: Payer: Self-pay

## 2020-04-09 DIAGNOSIS — Z30016 Encounter for initial prescription of transdermal patch hormonal contraceptive device: Secondary | ICD-10-CM

## 2020-04-09 MED ORDER — XULANE 150-35 MCG/24HR TD PTWK: 1.0000 | MEDICATED_PATCH | TRANSDERMAL | 4 refills | Status: DC

## 2020-04-11 ENCOUNTER — Ambulatory Visit: Payer: PRIVATE HEALTH INSURANCE | Admitting: Obstetrics and Gynecology

## 2020-04-21 ENCOUNTER — Ambulatory Visit (INDEPENDENT_AMBULATORY_CARE_PROVIDER_SITE_OTHER): Payer: PRIVATE HEALTH INSURANCE | Admitting: Obstetrics and Gynecology

## 2020-04-21 ENCOUNTER — Other Ambulatory Visit: Payer: Self-pay

## 2020-04-21 ENCOUNTER — Encounter: Payer: Self-pay | Admitting: Obstetrics and Gynecology

## 2020-04-21 VITALS — BP 110/72 | Ht 64.8 in | Wt 197.0 lb

## 2020-04-21 DIAGNOSIS — Z09 Encounter for follow-up examination after completed treatment for conditions other than malignant neoplasm: Secondary | ICD-10-CM

## 2020-04-21 DIAGNOSIS — D069 Carcinoma in situ of cervix, unspecified: Secondary | ICD-10-CM

## 2020-04-21 NOTE — Progress Notes (Signed)
° °  Postoperative Follow-up Patient presents post op from LEEP 4 weeks ago for CIN III.  Subjective: Patient reports some improvement in her preop symptoms. Eating a regular diet without difficulty. The patient is not having any pain.  Activity: normal activities of daily living.  Final Pathology DIAGNOSIS:  A. ENDOCERVIX; LEEP:  - ENDOCERVIX, NEGATIVE FOR DYSPLASIA AND MALIGNANCY.   B. 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  - ECTO- AND ENDOCERVICAL MARGINS ARE NEGATIVE FOR ATYPIA.  - TRANSFORMATION ZONE IS PRESENT.  - DEEPER SECTIONS EXAMINED.   C. ENDOCERVICAL CURETTINGS:  - SMALL FRAGMENTS OF UNREMARKABLE ENDOMETRIUM.  - MINUTE STRIP OF BENIGN ENDOCERVICAL EPITHELIUM.  - DEEPER SECTIONS EXAMINED.   Objective: Vitals:   04/21/20 0823  BP: 110/72   Vital Signs: BP 110/72    Ht 5' 4.8" (1.646 m)    Wt 197 lb (89.4 kg)    LMP 04/14/2020    BMI 32.99 kg/m  Constitutional: Well nourished, well developed female in no acute distress.  HEENT: normal Skin: Warm and dry.  Extremity: no edema  Abdomen: Soft, non-tender, normal bowel sounds; no bruits, organomegaly or masses. Pelvic exam: (female chaperone present) is not limited by body habitus EGBUS: within normal limits Vagina: within normal limits  Cervix: well healed cone bed. Bimanual confirms   Assessment: 29 y.o. s/p LEEP for CIN III progressing well  Plan: Patient has done well after surgery with no apparent complications.  I have discussed the post-operative course to date, and the expected progress moving forward.  The patient understands what complications to be concerned about.  I will see the patient in routine follow up, or sooner if needed.    Activity plan: No restriction.  Discussed timing of pregnancy and associated risks.    Thomasene Mohair, MD 04/21/2020, 8:42 AM

## 2020-08-25 ENCOUNTER — Ambulatory Visit (INDEPENDENT_AMBULATORY_CARE_PROVIDER_SITE_OTHER): Payer: PRIVATE HEALTH INSURANCE | Admitting: Obstetrics & Gynecology

## 2020-08-25 ENCOUNTER — Other Ambulatory Visit: Payer: Self-pay

## 2020-08-25 ENCOUNTER — Encounter: Payer: Self-pay | Admitting: Obstetrics & Gynecology

## 2020-08-25 ENCOUNTER — Other Ambulatory Visit (HOSPITAL_COMMUNITY)
Admission: RE | Admit: 2020-08-25 | Discharge: 2020-08-25 | Disposition: A | Discharge status: Home / Self Care | Payer: PRIVATE HEALTH INSURANCE | Source: Ambulatory Visit | Attending: Obstetrics & Gynecology | Admitting: Obstetrics & Gynecology

## 2020-08-25 VITALS — BP 100/60 | Wt 193.0 lb

## 2020-08-25 DIAGNOSIS — Z3480 Encounter for supervision of other normal pregnancy, unspecified trimester: Secondary | ICD-10-CM | POA: Insufficient documentation

## 2020-08-25 DIAGNOSIS — N926 Irregular menstruation, unspecified: Secondary | ICD-10-CM

## 2020-08-25 DIAGNOSIS — D069 Carcinoma in situ of cervix, unspecified: Secondary | ICD-10-CM

## 2020-08-25 DIAGNOSIS — O0990 Supervision of high risk pregnancy, unspecified, unspecified trimester: Secondary | ICD-10-CM | POA: Insufficient documentation

## 2020-08-25 DIAGNOSIS — Z9889 Other specified postprocedural states: Secondary | ICD-10-CM | POA: Insufficient documentation

## 2020-08-25 DIAGNOSIS — Z3491 Encounter for supervision of normal pregnancy, unspecified, first trimester: Secondary | ICD-10-CM

## 2020-08-25 DIAGNOSIS — Z3A01 Less than 8 weeks gestation of pregnancy: Secondary | ICD-10-CM

## 2020-08-25 DIAGNOSIS — O219 Vomiting of pregnancy, unspecified: Secondary | ICD-10-CM

## 2020-08-25 DIAGNOSIS — Z369 Encounter for antenatal screening, unspecified: Secondary | ICD-10-CM

## 2020-08-25 LAB — POCT URINALYSIS DIPSTICK OB
Glucose, UA: NEGATIVE
POC,PROTEIN,UA: NEGATIVE

## 2020-08-25 LAB — OB RESULTS CONSOLE GC/CHLAMYDIA: Gonorrhea: NEGATIVE

## 2020-08-25 LAB — POCT URINE PREGNANCY: Preg Test, Ur: POSITIVE — AB

## 2020-08-25 LAB — OB RESULTS CONSOLE VARICELLA ZOSTER ANTIBODY, IGG: Varicella: IMMUNE

## 2020-08-25 MED ORDER — DOXYLAMINE-PYRIDOXINE 10-10 MG PO TBEC: 2.0000 | DELAYED_RELEASE_TABLET | Freq: Every day | ORAL | 5 refills | Status: DC

## 2020-08-25 NOTE — Progress Notes (Signed)
08/25/2020  Chief Complaint: Missed period History of Present Illness: Autumn Davies is a 29 y.o. G3P1011 [redacted]w[redacted]d based on Patient's last menstrual period was 07/10/2020. with an Estimated Date of Delivery: 04/16/21, with the above CC.   Her periods were: regular periods every 28 days, although Oct and Noc periods were lighter than normal after coming off of Xulane patch in Oct  She was using no method when she conceived.  She has Positive signs or symptoms of nausea/vomiting of pregnancy. She has Negative signs or symptoms of miscarriage or preterm labor She identifies Negative Zika risk factors for her and her partner On any different medications around the time she conceived/early pregnancy: No (she has been tapered off of several meds prior to conception in anticipation of pregnancy) She reports insomnia as a concern, as she is off of meds that usually help w that. Reports n/v, numbness and tingling and headache for about a week. History of varicella: Yes   ROS: A 12-point review of systems was performed and negative, except as stated in the above HPI.  OBGYN History: As per HPI. OB History  Gravida Para Term Preterm AB Living  3 1 1  0 1 1  SAB IAB Ectopic Multiple Live Births  0 0 0 0 1    # Outcome Date GA Lbr Len/2nd Weight Sex Delivery Anes PTL Lv  3 Current           2 AB           1 Term     F Vag-Spont   LIV    Any issues with any prior pregnancies: PREECLAMPSIA w IOL at 44 WEEKS, 8 years ago Any prior children are healthy, doing well, without any problems or issues: yes History of pap smears: Yes. 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 History of STIs: No   Past Medical History: Past Medical History:  Diagnosis Date  . Anxiety   . 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 Endometriosis determined by laparoscopy   . Migraines     Past Surgical  History: Past Surgical History:  Procedure Laterality Date  . LAPAROSCOPY N/A 12/25/2015   Procedure: LAPAROSCOPY DIAGNOSTIC;  Surgeon: 12/27/2015, MD;  Location: [facility] ORS;  Service: Gynecology;  Laterality: N/A;  . LEEP N/A 03/18/2020   Procedure: LOOP ELECTROSURGICAL EXCISION PROCEDURE (LEEP);  Surgeon: 03/20/2020, MD;  Location: [facility] ORS;  Service: Gynecology;  Laterality: N/A;    Family History:  Family History  Problem Relation Age of Onset  . Charcot-Marie-Tooth disease Father   . Charcot-Marie-Tooth disease Sister   . Heart disease Maternal Grandmother   . Hyperlipidemia Maternal Grandmother   . Heart disease Maternal Grandfather    She denies any female cancers, bleeding or blood clotting disorders.  She denies any history of mental retardation, birth defects or genetic disorders in her or the FOB's history  Social History:  Social History   Socioeconomic History  . Marital status: Married    Spouse name: Not on file  . Number of children: Not on file  . Years of education: Not on file  . Highest education level: Not on file  Occupational History  . Not on file  Tobacco Use  . Smoking status: Never Smoker  . Smokeless tobacco: Never Used  Vaping Use  . Vaping Use: Never used  Substance and Sexual Activity  .  Alcohol use: No  . Drug use: No  . Sexual activity: Yes  Other Topics Concern  . Not on file  Social History Narrative  . Not on file   Social Determinants of Health   Financial Resource Strain: Not on file  Food Insecurity: Not on file  Transportation Needs: Not on file  Physical Activity: Not on file  Stress: Not on file  Social Connections: Not on file  Intimate Partner Violence: Not on file   Any pets in the household: no  Allergy: Allergies  Allergen Reactions  . Citalopram Other (See Comments)    Headache  . Sulfa Antibiotics Rash    Current Outpatient Medications:  Current Outpatient Medications:  .   Doxylamine-Pyridoxine (DICLEGIS) 10-10 MG TBEC, Take 2 tablets by mouth at bedtime. If symptoms persist, add one tablet in the morning and one in the afternoon, Disp: 100 tablet, Rfl: 5 .  Prenatal Vit-Fe Fumarate-FA (MULTIVITAMIN-PRENATAL) 27-0.8 MG TABS tablet, Take 1 tablet by mouth daily at 12 noon., Disp: , Rfl:    Physical Exam:   BP 100/60   Wt 193 lb (87.5 kg)   LMP 07/10/2020   BMI 32.32 kg/m  Body mass index is 32.32 kg/m. Constitutional: Well nourished, well developed female in no acute distress.  Neck:  Supple, normal appearance, and no thyromegaly  Cardiovascular: S1, S2 normal, no murmur, rub or gallop, regular rate and rhythm Respiratory:  Clear to auscultation bilateral. Normal respiratory effort Abdomen: positive bowel sounds and no masses, hernias; diffusely non tender to palpation, non distended Breasts: breasts appear normal, no suspicious masses, no skin or nipple changes or axillary nodes. Neuro/Psych:  Normal mood and affect.  Skin:  Warm and dry.  Lymphatic:  No inguinal lymphadenopathy.   Pelvic exam: is not limited by body habitus EGBUS: within normal limits, Vagina: within normal limits and with no blood in the vault, Cervix: normal appearing cervix and length on exam without discharge or lesions, closed/long/high, Uterus:  enlarged: 6 weeks, and Adnexa:  normal adnexa  Assessment: Autumn Davies is a 29 y.o. [id number] [redacted]w[redacted]d based on Patient's last menstrual period was 07/10/2020. with an Estimated Date of Delivery: 04/16/21,  for prenatal care.  Plan:  1) Avoid alcoholic beverages. 2) Patient encouraged not to smoke.  3) Discontinue the use of all non-medicinal drugs and chemicals.  4) Take prenatal vitamins daily.  5) Seatbelt use advised 6) Nutrition, food safety (fish, cheese advisories, and high nitrite foods) and exercise discussed. 7) Hospital and practice style delivering at [facility] discussed  8) Patient is asked about travel to areas at risk for the Zika  virus, and counseled to avoid travel and exposure to mosquitoes or sexual partners who may have themselves been exposed to the virus. Testing is discussed, and will be ordered as appropriate.  9) Childbirth classes at [facility] advised 10) Genetic Screening, such as with 1st Trimester Screening, cell free fetal DNA, AFP testing, and Ultrasound, as well as with amniocentesis and CVS as appropriate, is discussed with patient. She plans to consider genetic testing this pregnancy. 11) [place] soon 12) Discussed LEEP and risks for cervical incompetence.  Plan cervical checks by [place] or exam biweekly from 14-22 weeks.  Sx's to watch for counseled. 13) Diclegis for nausea and other sx's of early pregnancy 14) Insomnia, see if Diclegis helps.  DIscussed risks of other sleep aids.   Problem list reviewed and updated.  Autumn Major, MD, [facility], [facility] 08/25/2020  8:24 AM

## 2020-08-25 NOTE — Patient Instructions (Signed)
Due Date: 04/16/2021 ! Thank you for choosing [facility]. As part of our ongoing efforts to improve patient experience, we would appreciate your feedback. Please fill out the short survey that you will receive by mail or MyChart. Your opinion is important to [place]! -Dr Tiburcio Pea   First Trimester of Pregnancy The first trimester of pregnancy is from week 1 until the end of week 13 (months 1 through 3). A week after a sperm fertilizes an egg, the egg will implant on the wall of the uterus. This embryo will begin to develop into a baby. Genes from you and your partner will form the baby. The female genes will determine whether the baby will be a boy or a girl. At 6-8 weeks, the eyes and face will be formed, and the heartbeat can be seen on ultrasound. At the end of 12 weeks, all the baby's organs will be formed. Now that you are pregnant, you will want to do everything you can to have a healthy baby. Two of the most important things are to get good prenatal care and to follow your health care provider's instructions. Prenatal care is all the medical care you receive before the baby's birth. This care will help prevent, find, and treat any problems during the pregnancy and childbirth. Body changes during your first trimester Your body goes through many changes during pregnancy. The changes vary from woman to woman.  You may gain or lose a couple of pounds at first.  You may feel sick to your stomach (nauseous) and you may throw up (vomit). If the vomiting is uncontrollable, call your health care provider.  You may tire easily.  You may develop headaches that can be relieved by medicines. All medicines should be approved by your health care provider.  You may urinate more often. Painful urination may mean you have a bladder infection.  You may develop heartburn as a result of your pregnancy.  You may develop constipation because certain hormones are causing the muscles that push stool through your  intestines to slow down.  You may develop hemorrhoids or swollen veins (varicose veins).  Your breasts may begin to grow larger and become tender. Your nipples may stick out more, and the tissue that surrounds them (areola) may become darker.  Your gums may bleed and may be sensitive to brushing and flossing.  Dark spots or blotches (chloasma, mask of pregnancy) may develop on your face. This will likely fade after the baby is born.  Your menstrual periods will stop.  You may have a loss of appetite.  You may develop cravings for certain kinds of food.  You may have changes in your emotions from day to day, such as being excited to be pregnant or being concerned that something may go wrong with the pregnancy and baby.  You may have more vivid and strange dreams.  You may have changes in your hair. These can include thickening of your hair, rapid growth, and changes in texture. Some women also have hair loss during or after pregnancy, or hair that feels dry or thin. Your hair will most likely return to normal after your baby is born. What to expect at prenatal visits During a routine prenatal visit:  You will be weighed to make sure you and the baby are growing normally.  Your blood pressure will be taken.  Your abdomen will be measured to track your baby's growth.  The fetal heartbeat will be listened to between weeks 10 and 14 of  your pregnancy.  Test results from any previous visits will be discussed. Your health care provider may ask you:  How you are feeling.  If you are feeling the baby move.  If you have had any abnormal symptoms, such as leaking fluid, bleeding, severe headaches, or abdominal cramping.  If you are using any tobacco products, including cigarettes, chewing tobacco, and electronic cigarettes.  If you have any questions. Other tests that may be performed during your first trimester include:  Blood tests to find your blood type and to check for the  presence of any previous infections. The tests will also be used to check for low iron levels (anemia) and protein on red blood cells (Rh antibodies). Depending on your risk factors, or if you previously had diabetes during pregnancy, you may have tests to check for high blood sugar that affects pregnant women (gestational diabetes).  Urine tests to check for infections, diabetes, or protein in the urine.  An ultrasound to confirm the proper growth and development of the baby.  Fetal screens for spinal cord problems (spina bifida) and Down syndrome.  HIV (human immunodeficiency virus) testing. Routine prenatal testing includes screening for HIV, unless you choose not to have this test.  You may need other tests to make sure you and the baby are doing well. Follow these instructions at home: Medicines  Follow your health care provider's instructions regarding medicine use. Specific medicines may be either safe or unsafe to take during pregnancy.  Take a prenatal vitamin that contains at least 600 micrograms (mcg) of folic acid.  If you develop constipation, try taking a stool softener if your health care provider approves. Eating and drinking   Eat a balanced diet that includes fresh fruits and vegetables, whole grains, good sources of protein such as meat, eggs, or tofu, and low-fat dairy. Your health care provider will help you determine the amount of weight gain that is right for you.  Avoid raw meat and uncooked cheese. These carry germs that can cause birth defects in the baby.  Eating four or five small meals rather than three large meals a day may help relieve nausea and vomiting. If you start to feel nauseous, eating a few soda crackers can be helpful. Drinking liquids between meals, instead of during meals, also seems to help ease nausea and vomiting.  Limit foods that are high in fat and processed sugars, such as fried and sweet foods.  To prevent constipation: ? Eat foods  that are high in fiber, such as fresh fruits and vegetables, whole grains, and beans. ? Drink enough fluid to keep your urine clear or pale yellow. Activity  Exercise only as directed by your health care provider. Most women can continue their usual exercise routine during pregnancy. Try to exercise for 30 minutes at least 5 days a week. Exercising will help you: ? Control your weight. ? Stay in shape. ? Be prepared for labor and delivery.  Experiencing pain or cramping in the lower abdomen or lower back is a good sign that you should stop exercising. Check with your health care provider before continuing with normal exercises.  Try to avoid standing for long periods of time. Move your legs often if you must stand in one place for a long time.  Avoid heavy lifting.  Wear low-heeled shoes and practice good posture.  You may continue to have sex unless your health care provider tells you not to. Relieving pain and discomfort  Wear a good support bra  to relieve breast tenderness.  Take warm sitz baths to soothe any pain or discomfort caused by hemorrhoids. Use hemorrhoid cream if your health care provider approves.  Rest with your legs elevated if you have leg cramps or low back pain.  If you develop varicose veins in your legs, wear support hose. Elevate your feet for 15 minutes, 3-4 times a day. Limit salt in your diet. Prenatal care  Schedule your prenatal visits by the twelfth week of pregnancy. They are usually scheduled monthly at first, then more often in the last 2 months before delivery.  Write down your questions. Take them to your prenatal visits.  Keep all your prenatal visits as told by your health care provider. This is important. Safety  Wear your seat belt at all times when driving.  Make a list of emergency phone numbers, including numbers for family, friends, the hospital, and police and fire departments. General instructions  Ask your health care provider for  a referral to a local prenatal education class. Begin classes no later than the beginning of month 6 of your pregnancy.  Ask for help if you have counseling or nutritional needs during pregnancy. Your health care provider can offer advice or refer you to specialists for help with various needs.  Do not use hot tubs, steam rooms, or saunas.  Do not douche or use tampons or scented sanitary pads.  Do not cross your legs for long periods of time.  Avoid cat litter boxes and soil used by cats. These carry germs that can cause birth defects in the baby and possibly loss of the fetus by miscarriage or stillbirth.  Avoid all smoking, herbs, alcohol, and medicines not prescribed by your health care provider. Chemicals in these products affect the formation and growth of the baby.  Do not use any products that contain nicotine or tobacco, such as cigarettes and e-cigarettes. If you need help quitting, ask your health care provider. You may receive counseling support and other resources to help you quit.  Schedule a dentist appointment. At home, brush your teeth with a soft toothbrush and be gentle when you floss. Contact a health care provider if:  You have dizziness.  You have mild pelvic cramps, pelvic pressure, or nagging pain in the abdominal area.  You have persistent nausea, vomiting, or diarrhea.  You have a bad smelling vaginal discharge.  You have pain when you urinate.  You notice increased swelling in your face, hands, legs, or ankles.  You are exposed to fifth disease or chickenpox.  You are exposed to [place] measles (rubella) and have never had it. Get help right away if:  You have a fever.  You are leaking fluid from your vagina.  You have spotting or bleeding from your vagina.  You have severe abdominal cramping or pain.  You have rapid weight gain or loss.  You vomit blood or material that looks like coffee grounds.  You develop a severe headache.  You have  shortness of breath.  You have any kind of trauma, such as from a fall or a car accident. Summary  The first trimester of pregnancy is from week 1 until the end of week 13 (months 1 through 3).  Your body goes through many changes during pregnancy. The changes vary from woman to woman.  You will have routine prenatal visits. During those visits, your health care provider will examine you, discuss any test results you may have, and talk with you about how you are  feeling. This information is not intended to replace advice given to you by your health care provider. Make sure you discuss any questions you have with your health care provider. Document Revised: 07/29/2017 Document Reviewed: 07/28/2016 Elsevier Patient Education  2020 [employer]

## 2020-08-26 LAB — RPR+RH+ABO+RUB AB+AB SCR+CB...
Antibody Screen: NEGATIVE
HIV Screen 4th Generation wRfx: NONREACTIVE
Hematocrit: 41 % (ref 34.0–46.6)
Hemoglobin: 13.7 g/dL (ref 11.1–15.9)
Hepatitis B Surface Ag: NEGATIVE
MCH: 29.7 pg (ref 26.6–33.0)
MCHC: 33.4 g/dL (ref 31.5–35.7)
MCV: 89 fL (ref 79–97)
Platelets: 290 10*3/uL (ref 150–450)
RBC: 4.61 x10E6/uL (ref 3.77–5.28)
RDW: 13.1 % (ref 11.7–15.4)
RPR Ser Ql: NONREACTIVE
Rh Factor: POSITIVE
Rubella Antibodies, IGG: 2.13 index (ref >0.99)
Varicella zoster IgG: 500 index (ref >165)
WBC: 9 10*3/uL (ref 3.4–10.8)

## 2020-08-27 LAB — CYTOLOGY - PAP
Chlamydia: NEGATIVE
Comment: NEGATIVE
Comment: NEGATIVE
Comment: NORMAL
Diagnosis: UNDETERMINED — AB
Neisseria Gonorrhea: NEGATIVE
Trichomonas: NEGATIVE

## 2020-08-29 LAB — URINE CULTURE

## 2020-09-08 ENCOUNTER — Encounter: Payer: Medicaid Other | Admitting: Obstetrics and Gynecology

## 2020-09-08 ENCOUNTER — Ambulatory Visit: Payer: Medicaid Other

## 2020-09-12 ENCOUNTER — Encounter: Payer: Self-pay | Admitting: Obstetrics and Gynecology

## 2020-09-12 ENCOUNTER — Other Ambulatory Visit: Payer: Self-pay

## 2020-09-12 ENCOUNTER — Encounter: Payer: PRIVATE HEALTH INSURANCE | Admitting: Obstetrics and Gynecology

## 2020-09-12 ENCOUNTER — Ambulatory Visit (INDEPENDENT_AMBULATORY_CARE_PROVIDER_SITE_OTHER): Payer: Medicaid Other | Admitting: Obstetrics and Gynecology

## 2020-09-12 ENCOUNTER — Ambulatory Visit (INDEPENDENT_AMBULATORY_CARE_PROVIDER_SITE_OTHER): Payer: Self-pay

## 2020-09-12 VITALS — BP 118/74 | Wt 190.0 lb

## 2020-09-12 DIAGNOSIS — N926 Irregular menstruation, unspecified: Secondary | ICD-10-CM

## 2020-09-12 DIAGNOSIS — Z3481 Encounter for supervision of other normal pregnancy, first trimester: Secondary | ICD-10-CM

## 2020-09-12 DIAGNOSIS — Z9889 Other specified postprocedural states: Secondary | ICD-10-CM

## 2020-09-12 DIAGNOSIS — Z3A09 9 weeks gestation of pregnancy: Secondary | ICD-10-CM

## 2020-09-12 DIAGNOSIS — O99211 Obesity complicating pregnancy, first trimester: Secondary | ICD-10-CM

## 2020-09-12 DIAGNOSIS — Z131 Encounter for screening for diabetes mellitus: Secondary | ICD-10-CM

## 2020-09-12 DIAGNOSIS — O99210 Obesity complicating pregnancy, unspecified trimester: Secondary | ICD-10-CM | POA: Insufficient documentation

## 2020-09-12 NOTE — Progress Notes (Signed)
Routine Prenatal Care Visit  Subjective  Autumn Davies is a 30 y.o. G3P1011 at [redacted]w[redacted]d being seen today for ongoing prenatal care.  She is currently monitored for the following issues for this high-risk pregnancy and has Pelvic pain in female; Endometriosis determined by laparoscopy; CMT (Charcot-Marie-Tooth disease); Menometrorrhagia; CIN III (cervical intraepithelial neoplasia grade III) with severe dysplasia; S/P LEEP; Supervision of other normal pregnancy, antepartum; and Obesity affecting pregnancy on their problem list.  ----------------------------------------------------------------------------------- Patient reports no complaints.    . Vag. Bleeding: None.   . Leaking Fluid denies.  U/S confirms EDD ----------------------------------------------------------------------------------- The following portions of the patient's history were reviewed and updated as appropriate: allergies, current medications, past family history, past medical history, past social history, past surgical history and problem list. Problem list updated.  Objective  Blood pressure 118/74, weight 190 lb (86.2 kg), last menstrual period 07/10/2020. Pregravid weight 194 lb (88 kg) Total Weight Gain -4 lb (-1.814 kg) Urinalysis: Urine Protein    Urine Glucose    Fetal Status: Fetal Heart Rate (bpm): 181 ([place])         General:  Alert, oriented and cooperative. Patient is in no acute distress.  Skin: Skin is warm and dry. No rash noted.   Cardiovascular: Normal heart rate noted  Respiratory: Normal respiratory effort, no problems with respiration noted  Abdomen: Soft, gravid, appropriate for gestational age. Pain/Pressure: Absent     Pelvic:  Cervical exam deferred        Extremities: Normal range of motion.     Mental Status: Normal mood and affect. Normal behavior. Normal judgment and thought content.   Imaging Results US OB LESS THAN 14 WEEKS WITH OB TRANSVAGINAL  Result Date: 09/12/2020 Patient Name:  Autumn Davies DOB: 22-Feb-1991 MRN: [id number] ULTRASOUND REPORT Location: [facility] Date of Service: 09/12/2020 Indications:dating Findings: Mason Jim intrauterine pregnancy is visualized with a CRL consistent with [redacted]w[redacted]d gestation, giving an (U/S) EDD of 04/16/2021. The (U/S) EDD is consistent with the clinically established EDD of 04/16/2021. FHR: 181 BPM CRL measurement: 24.3 mm Yolk sac is visualized and appears normal. Amnion: visualized and appears normal Right Ovary is normal in appearance. Left Ovary is normal appearance. Corpus luteal cyst:  Left ovary Survey of the adnexa demonstrates no adnexal masses. There is no free peritoneal fluid in the cul de sac. Impression: 1. [redacted]w[redacted]d Viable Singleton Intrauterine pregnancy by U/S. 2. (U/S) EDD is consistent with Clinically established EDD of 04/16/2021. Autumn Davies, RT There is a viable singleton gestation.  Detailed evaluation of the fetal anatomy is precluded by early gestational age.  It must be noted that a normal ultrasound particular at this early gestational age is unable to rule out fetal aneuploidy, risk of first trimester miscarriage, or anatomic birth defects. Autumn Mohair, MD, [facility], [facility] 09/12/2020 11:17 AM      Assessment   29 y.o. G3P1011 at [redacted]w[redacted]d by  04/16/2021, by Last Menstrual Period presenting for routine prenatal visit  Plan   pregnancy3 Problems (from 07/10/20 to present)    Problem Noted Resolved   Obesity affecting pregnancy 09/12/2020 by Autumn Novak, MD No   Overview Addendum 09/12/2020 11:40 AM by Autumn Novak, MD    [ ]  early hgba1c - has family planning mcd. So, can't get this lab.      Previous Version   S/P LEEP 08/25/2020 by 08/27/2020, MD No   Overview Signed 09/12/2020 11:20 AM by 09/14/2020, MD    [ ]   TV cvx length at 16 wks      Supervision of other normal pregnancy, antepartum 08/25/2020 by Autumn Mustard, MD No   Overview Addendum  09/12/2020 11:19 AM by Autumn Novak, MD    Clinic [facility] Prenatal Labs  Dating L=9 Blood type: O/Positive/-- (12/27 0831)   Genetic Screen 1 Screen:    AFP:     Quad:     NIPS: Antibody:Negative (12/27 0831)  Anatomic [place]  Rubella: 2.13 (12/27 0831)  Varicella: immune  GTT Early:               Third trimester:  RPR: Non Reactive (12/27 0831)   Rhogam  HBsAg: Negative (12/27 0831)   TDaP vaccine                       Flu Shot: HIV: Non Reactive (12/27 0831)   Baby Food                                GBS:   Contraception  Pap: LEEP 7/21 for CIN III, due 7/22  CBB  no   CS/VBAC n/a   Support Person Autumn Davies, husband           Previous Version       Preterm labor symptoms and general obstetric precautions including but not limited to vaginal bleeding, contractions, leaking of fluid and fetal movement were reviewed in detail with the patient. Please refer to After Visit Summary for other counseling recommendations.   - unable to do hemoglobin a1c due to insurance  Return in about 4 weeks (around 10/10/2020) for Routine Prenatal Appointment.   Autumn Mohair, MD, [facility], [facility] 09/12/2020 11:40 AM

## 2020-09-12 NOTE — Addendum Note (Signed)
Addended by: Thomasene Mohair D on: 09/12/2020 11:44 AM   Modules accepted: Orders

## 2020-09-13 ENCOUNTER — Other Ambulatory Visit: Payer: Medicaid Other

## 2020-09-14 ENCOUNTER — Other Ambulatory Visit: Payer: Medicaid Other

## 2020-10-10 ENCOUNTER — Other Ambulatory Visit: Payer: Self-pay

## 2020-10-10 ENCOUNTER — Ambulatory Visit (INDEPENDENT_AMBULATORY_CARE_PROVIDER_SITE_OTHER): Payer: 59 | Admitting: Obstetrics and Gynecology

## 2020-10-10 ENCOUNTER — Encounter: Payer: Self-pay | Admitting: Obstetrics and Gynecology

## 2020-10-10 VITALS — BP 122/70 | Wt 189.0 lb

## 2020-10-10 DIAGNOSIS — Z3A13 13 weeks gestation of pregnancy: Secondary | ICD-10-CM

## 2020-10-10 DIAGNOSIS — O99211 Obesity complicating pregnancy, first trimester: Secondary | ICD-10-CM

## 2020-10-10 DIAGNOSIS — Z3481 Encounter for supervision of other normal pregnancy, first trimester: Secondary | ICD-10-CM

## 2020-10-10 DIAGNOSIS — G600 Hereditary motor and sensory neuropathy: Secondary | ICD-10-CM

## 2020-10-10 DIAGNOSIS — Z9889 Other specified postprocedural states: Secondary | ICD-10-CM

## 2020-10-10 DIAGNOSIS — Z131 Encounter for screening for diabetes mellitus: Secondary | ICD-10-CM

## 2020-10-10 NOTE — Progress Notes (Signed)
Routine Prenatal Care Visit  Subjective  Autumn Davies is a 30 y.o. G3P1011 at [redacted]w[redacted]d being seen today for ongoing prenatal care.  She is currently monitored for the following issues for this high-risk pregnancy and has Pelvic pain in female; Endometriosis determined by laparoscopy; CMT (Charcot-Marie-Tooth disease); Menometrorrhagia; CIN III (cervical intraepithelial neoplasia grade III) with severe dysplasia; S/P LEEP; Supervision of other normal pregnancy, antepartum; and Obesity affecting pregnancy on their problem list.  ----------------------------------------------------------------------------------- Patient reports no complaints.    . Vag. Bleeding: None.  Movement: Absent. Leaking Fluid denies.  ----------------------------------------------------------------------------------- The following portions of the patient's history were reviewed and updated as appropriate: allergies, current medications, past family history, past medical history, past social history, past surgical history and problem list. Problem list updated.  Objective  Blood pressure 122/70, weight 189 lb (85.7 kg), last menstrual period 07/10/2020. Pregravid weight 194 lb (88 kg) Total Weight Gain -5 lb (-2.268 kg) Urinalysis: Urine Protein    Urine Glucose    Fetal Status: Fetal Heart Rate (bpm): 150   Movement: Absent     General:  Alert, oriented and cooperative. Patient is in no acute distress.  Skin: Skin is warm and dry. No rash noted.   Cardiovascular: Normal heart rate noted  Respiratory: Normal respiratory effort, no problems with respiration noted  Abdomen: Soft, gravid, appropriate for gestational age. Pain/Pressure: Absent     Pelvic:  Cervical exam deferred        Extremities: Normal range of motion.     Mental Status: Normal mood and affect. Normal behavior. Normal judgment and thought content.   Assessment   30 y.o. G3P1011 at [redacted]w[redacted]d by  04/16/2021, by Last Menstrual Period presenting for  routine prenatal visit  Plan   pregnancy3 Problems (from 07/10/20 to present)    Problem Noted Resolved   Obesity affecting pregnancy 09/12/2020 by Conard Novak, MD No   Overview Addendum 09/12/2020 11:40 AM by Conard Novak, MD    [ ]  early hgba1c - has family planning mcd. So, can't get this lab.      Previous Version   S/P LEEP 08/25/2020 by 08/27/2020, MD No   Overview Signed 09/12/2020 11:20 AM by 09/14/2020, MD    [ ]  TV cvx length at 16 wks      Supervision of other normal pregnancy, antepartum 08/25/2020 by , MD No   Overview Addendum 09/12/2020 11:19 AM by Nadara Mustard, MD    [facility]inic [facility] Prenatal Labs  Dating L=9 Blood type: O/Positive/-- (12/27 0831)   Genetic Screen 1 Screen:    AFP:     Quad:     NIPS: Antibody:Negative (12/27 0831)  Anatomic 04-20-1986  Rubella: 2.13 (12/27 0831)  Varicella: immune  GTT Early:               Third trimester:  RPR: Non Reactive (12/27 0831)   Rhogam  HBsAg: Negative (12/27 0831)   TDaP vaccine                       Flu Shot: HIV: Non Reactive (12/27 0831)   Baby Food                                GBS:   Contraception  Pap: LEEP 7/21 for CIN III, due 7/22  CBB  no   CS/VBAC n/a   Support Person 8/21,  husband           Previous Version       Preterm labor symptoms and general obstetric precautions including but not limited to vaginal bleeding, contractions, leaking of fluid and fetal movement were reviewed in detail with the patient. Please refer to After Visit Summary for other counseling recommendations.   - hgb A1c today  Return in about 3 weeks (around 10/31/2020) for U/S For cervical length and routine prenatal.   Thomasene Mohair, MD, [facility], [facility] 10/10/2020 8:39 AM

## 2020-10-11 LAB — HEMOGLOBIN A1C
Est. average glucose Bld gHb Est-mCnc: 94 mg/dL
Hgb A1c MFr Bld: 4.9 % (ref 4.8–5.6)

## 2020-11-03 ENCOUNTER — Encounter: Payer: 59 | Admitting: Obstetrics and Gynecology

## 2020-11-03 ENCOUNTER — Ambulatory Visit: Payer: 59

## 2020-11-03 DIAGNOSIS — Z3481 Encounter for supervision of other normal pregnancy, first trimester: Secondary | ICD-10-CM

## 2020-11-03 DIAGNOSIS — Z9889 Other specified postprocedural states: Secondary | ICD-10-CM

## 2020-11-11 ENCOUNTER — Ambulatory Visit: Payer: 59

## 2020-11-11 ENCOUNTER — Encounter: Payer: 59 | Admitting: Obstetrics

## 2020-11-12 ENCOUNTER — Ambulatory Visit
Admission: RE | Admit: 2020-11-12 | Discharge: 2020-11-12 | Disposition: A | Discharge status: Home / Self Care | Payer: Self-pay | Source: Ambulatory Visit | Attending: Obstetrics and Gynecology | Admitting: Obstetrics and Gynecology

## 2020-11-12 ENCOUNTER — Other Ambulatory Visit: Payer: Self-pay

## 2020-11-12 ENCOUNTER — Other Ambulatory Visit: Payer: Self-pay | Admitting: Obstetrics and Gynecology

## 2020-11-12 DIAGNOSIS — Z9889 Other specified postprocedural states: Secondary | ICD-10-CM | POA: Insufficient documentation

## 2020-11-12 DIAGNOSIS — Z3481 Encounter for supervision of other normal pregnancy, first trimester: Secondary | ICD-10-CM

## 2020-11-13 ENCOUNTER — Telehealth: Payer: Self-pay

## 2020-11-13 NOTE — Telephone Encounter (Signed)
Y shaped cervix 2.8cm concerning for cervical incompetence. Follow up complete OB Ultrasound should be considered. Full Report is on the way.

## 2020-11-14 ENCOUNTER — Other Ambulatory Visit: Payer: Self-pay

## 2020-11-14 ENCOUNTER — Ambulatory Visit (INDEPENDENT_AMBULATORY_CARE_PROVIDER_SITE_OTHER): Payer: Self-pay | Admitting: Obstetrics and Gynecology

## 2020-11-14 ENCOUNTER — Encounter: Payer: Self-pay | Admitting: Obstetrics and Gynecology

## 2020-11-14 VITALS — BP 120/74 | Wt 190.0 lb

## 2020-11-14 DIAGNOSIS — O99212 Obesity complicating pregnancy, second trimester: Secondary | ICD-10-CM

## 2020-11-14 DIAGNOSIS — O0992 Supervision of high risk pregnancy, unspecified, second trimester: Secondary | ICD-10-CM

## 2020-11-14 DIAGNOSIS — Z9889 Other specified postprocedural states: Secondary | ICD-10-CM

## 2020-11-14 DIAGNOSIS — Z3A18 18 weeks gestation of pregnancy: Secondary | ICD-10-CM

## 2020-11-14 NOTE — Progress Notes (Signed)
Routine Prenatal Care Visit  Subjective  Autumn Davies is a 30 y.o. G3P1011 at [redacted]w[redacted]d being seen today for ongoing prenatal care.  She is currently monitored for the following issues for this high-risk pregnancy and has Pelvic pain in female; Endometriosis determined by laparoscopy; CMT (Charcot-Marie-Tooth disease); Menometrorrhagia; CIN III (cervical intraepithelial neoplasia grade III) with severe dysplasia; S/P LEEP; Supervision of high risk pregnancy, antepartum; and Obesity affecting pregnancy on their problem list.  ----------------------------------------------------------------------------------- Patient reports occasional sharp pelvic floor plan.    . Vag. Bleeding: None.  Movement: Present. Leaking Fluid denies.  ----------------------------------------------------------------------------------- The following portions of the patient's history were reviewed and updated as appropriate: allergies, current medications, past family history, past medical history, past social history, past surgical history and problem list. Problem list updated.  Objective  Blood pressure 120/74, weight 190 lb (86.2 kg), last menstrual period 07/10/2020. Pregravid weight 194 lb (88 kg) Total Weight Gain -4 lb (-1.814 kg) Urinalysis: Urine Protein    Urine Glucose    Fetal Status: Fetal Heart Rate (bpm): 155   Movement: Present     General:  Alert, oriented and cooperative. Patient is in no acute distress.  Skin: Skin is warm and dry. No rash noted.   Cardiovascular: Normal heart rate noted  Respiratory: Normal respiratory effort, no problems with respiration noted  Abdomen: Soft, gravid, appropriate for gestational age. Pain/Pressure: Absent     Pelvic:  Cervical exam deferred        Extremities: Normal range of motion.     Mental Status: Normal mood and affect. Normal behavior. Normal judgment and thought content.   Assessment   30 y.o. G3P1011 at [redacted]w[redacted]d by  04/16/2021, by Last Menstrual Period  presenting for routine prenatal visit  Plan   pregnancy3 Problems (from 07/10/20 to present)    Problem Noted Resolved   Obesity affecting pregnancy 09/12/2020 by Conard Novak, MD No   Overview Addendum 09/12/2020 11:40 AM by Conard Novak, MD    [ ]  early hgba1c - has family planning mcd. So, can't get this lab.      Previous Version   S/P LEEP 08/25/2020 by 08/27/2020, MD No   Overview Signed 09/12/2020 11:20 AM by 09/14/2020, MD    [ ]  TV cvx length at 16 wks      Supervision of high risk pregnancy, antepartum 08/25/2020 by , MD No   Overview Addendum 09/12/2020 11:19 AM by Nadara Mustard, MD    [facility] Prenatal Labs  Dating L=9 Blood type: O/Positive/-- (12/27 0831)   Genetic Screen 1 Screen:    AFP:     Quad:     NIPS: Antibody:Negative (12/27 0831)  Anatomic 04-20[birth year]1986  Rubella: 2.13 (12/27 0831)  Varicella: immune  GTT Early:               Third trimester:  RPR: Non Reactive (12/27 0831)   Rhogam  HBsAg: Negative (12/27 0831)   TDaP vaccine                       Flu Shot: HIV: Non Reactive (12/27 0831)   Baby Food                                GBS:   Contraception  Pap: LEEP 7/21 for CIN III, due 7/22  CBB  no   CS/VBAC n/a  Support Person Gerilyn Pilgrim, husband           Previous Version       Preterm labor symptoms and general obstetric precautions including but not limited to vaginal bleeding, contractions, leaking of fluid and fetal movement were reviewed in detail with the patient. Please refer to After Visit Summary for other counseling recommendations.   U/S two days ago showed cervical length of 2.8 cm.  Per Dr. Grace Bushy, he will scan her in 2 weeks. Will get anatomy u/s at same time.  Discussed with patient.    Return in about 4 weeks (around 12/12/2020) for Routine Prenatal Appointment.   Thomasene Mohair, MD, [facility], [facility] 11/14/2020 3:57 PM

## 2020-11-17 ENCOUNTER — Other Ambulatory Visit: Payer: Self-pay | Admitting: Obstetrics and Gynecology

## 2020-11-17 ENCOUNTER — Telehealth: Payer: Self-pay

## 2020-11-17 DIAGNOSIS — Z363 Encounter for antenatal screening for malformations: Secondary | ICD-10-CM

## 2020-11-17 NOTE — Telephone Encounter (Signed)
LMVM TRC regarding Maternal Fetal Care appointment scheduling. Advised will send my chart message as well.

## 2020-11-26 ENCOUNTER — Ambulatory Visit: Payer: Self-pay | Attending: Obstetrics and Gynecology

## 2020-11-26 ENCOUNTER — Ambulatory Visit: Payer: Self-pay | Admitting: *Deleted

## 2020-11-26 ENCOUNTER — Other Ambulatory Visit: Payer: Self-pay | Admitting: *Deleted

## 2020-11-26 ENCOUNTER — Encounter: Payer: Self-pay | Admitting: *Deleted

## 2020-11-26 ENCOUNTER — Other Ambulatory Visit: Payer: Self-pay | Admitting: Obstetrics and Gynecology

## 2020-11-26 ENCOUNTER — Other Ambulatory Visit: Payer: Self-pay

## 2020-11-26 DIAGNOSIS — O3442 Maternal care for other abnormalities of cervix, second trimester: Secondary | ICD-10-CM

## 2020-11-26 DIAGNOSIS — Z9889 Other specified postprocedural states: Secondary | ICD-10-CM | POA: Insufficient documentation

## 2020-11-26 DIAGNOSIS — O0992 Supervision of high risk pregnancy, unspecified, second trimester: Secondary | ICD-10-CM

## 2020-11-26 DIAGNOSIS — Z9289 Personal history of other medical treatment: Secondary | ICD-10-CM

## 2020-11-26 DIAGNOSIS — O0990 Supervision of high risk pregnancy, unspecified, unspecified trimester: Secondary | ICD-10-CM

## 2020-11-26 DIAGNOSIS — Z363 Encounter for antenatal screening for malformations: Secondary | ICD-10-CM | POA: Insufficient documentation

## 2020-11-26 DIAGNOSIS — N883 Incompetence of cervix uteri: Secondary | ICD-10-CM

## 2020-11-26 DIAGNOSIS — O26872 Cervical shortening, second trimester: Secondary | ICD-10-CM

## 2020-11-26 DIAGNOSIS — Z3A19 19 weeks gestation of pregnancy: Secondary | ICD-10-CM

## 2020-11-26 MED ORDER — PROGESTERONE 200 MG PO CAPS: 200.0000 mg | ORAL_CAPSULE | Freq: Every day | ORAL | 1 refills | Status: DC

## 2020-12-11 ENCOUNTER — Ambulatory Visit: Payer: Medicaid Other

## 2020-12-11 ENCOUNTER — Encounter: Payer: Medicaid Other | Admitting: Obstetrics and Gynecology

## 2020-12-15 ENCOUNTER — Ambulatory Visit: Payer: BC Managed Care – PPO | Attending: Obstetrics

## 2020-12-15 ENCOUNTER — Ambulatory Visit: Payer: BC Managed Care – PPO | Admitting: *Deleted

## 2020-12-15 ENCOUNTER — Encounter: Payer: Self-pay | Admitting: *Deleted

## 2020-12-15 ENCOUNTER — Other Ambulatory Visit: Payer: Self-pay

## 2020-12-15 DIAGNOSIS — N879 Dysplasia of cervix uteri, unspecified: Secondary | ICD-10-CM

## 2020-12-15 DIAGNOSIS — N883 Incompetence of cervix uteri: Secondary | ICD-10-CM | POA: Insufficient documentation

## 2020-12-15 DIAGNOSIS — Z3A22 22 weeks gestation of pregnancy: Secondary | ICD-10-CM | POA: Diagnosis not present

## 2020-12-15 DIAGNOSIS — O0990 Supervision of high risk pregnancy, unspecified, unspecified trimester: Secondary | ICD-10-CM | POA: Diagnosis present

## 2020-12-15 DIAGNOSIS — Z9889 Other specified postprocedural states: Secondary | ICD-10-CM | POA: Diagnosis present

## 2020-12-15 DIAGNOSIS — O26872 Cervical shortening, second trimester: Secondary | ICD-10-CM

## 2020-12-15 DIAGNOSIS — O3442 Maternal care for other abnormalities of cervix, second trimester: Secondary | ICD-10-CM | POA: Diagnosis not present

## 2020-12-18 ENCOUNTER — Other Ambulatory Visit: Payer: Self-pay

## 2020-12-18 ENCOUNTER — Ambulatory Visit (INDEPENDENT_AMBULATORY_CARE_PROVIDER_SITE_OTHER): Payer: BC Managed Care – PPO | Admitting: Obstetrics and Gynecology

## 2020-12-18 ENCOUNTER — Encounter: Payer: Self-pay | Admitting: Obstetrics and Gynecology

## 2020-12-18 VITALS — BP 129/74 | Wt 194.0 lb

## 2020-12-18 DIAGNOSIS — O0992 Supervision of high risk pregnancy, unspecified, second trimester: Secondary | ICD-10-CM

## 2020-12-18 DIAGNOSIS — Z131 Encounter for screening for diabetes mellitus: Secondary | ICD-10-CM

## 2020-12-18 DIAGNOSIS — O26872 Cervical shortening, second trimester: Secondary | ICD-10-CM

## 2020-12-18 DIAGNOSIS — Z3A23 23 weeks gestation of pregnancy: Secondary | ICD-10-CM

## 2020-12-18 DIAGNOSIS — Z113 Encounter for screening for infections with a predominantly sexual mode of transmission: Secondary | ICD-10-CM

## 2020-12-18 DIAGNOSIS — Z9889 Other specified postprocedural states: Secondary | ICD-10-CM

## 2020-12-18 DIAGNOSIS — O99212 Obesity complicating pregnancy, second trimester: Secondary | ICD-10-CM

## 2020-12-18 NOTE — Progress Notes (Signed)
Routine Prenatal Care Visit  Subjective  Autumn Davies is a 30 y.o. G3P1011 at [redacted]w[redacted]d being seen today for ongoing prenatal care.  She is currently monitored for the following issues for this high-risk pregnancy and has Pelvic pain in female; Endometriosis determined by laparoscopy; CMT (Charcot-Marie-Tooth disease); Menometrorrhagia; CIN III (cervical intraepithelial neoplasia grade III) with severe dysplasia; S/P LEEP; Supervision of high risk pregnancy, antepartum; Obesity affecting pregnancy; and Short cervical length during pregnancy in second trimester on their problem list.  ----------------------------------------------------------------------------------- Patient reports no complaints.   Contractions: Not present. Vag. Bleeding: None.  Movement: Present. Leaking Fluid denies.  Cervical length u/s shows stable cervical length. She continues to take vaginal progesterone.  She has a follow up with MFM in 1 week for another cervical length u/s.  ----------------------------------------------------------------------------------- The following portions of the patient's history were reviewed and updated as appropriate: allergies, current medications, past family history, past medical history, past social history, past surgical history and problem list. Problem list updated.  Objective  Blood pressure 129/74, weight 194 lb (88 kg), last menstrual period 07/10/2020. Pregravid weight 194 lb (88 kg) Total Weight Gain 0 lb (0 kg) Urinalysis: Urine Protein    Urine Glucose    Fetal Status: Fetal Heart Rate (bpm): 155   Movement: Present     General:  Alert, oriented and cooperative. Patient is in no acute distress.  Skin: Skin is warm and dry. No rash noted.   Cardiovascular: Normal heart rate noted  Respiratory: Normal respiratory effort, no problems with respiration noted  Abdomen: Soft, gravid, appropriate for gestational age. Pain/Pressure: Absent     Pelvic:  Cervical exam deferred         Extremities: Normal range of motion.     Mental Status: Normal mood and affect. Normal behavior. Normal judgment and thought content.   Assessment   29 y.o. G3P1011 at [redacted]w[redacted]d by  04/16/2021, by Last Menstrual Period presenting for routine prenatal visit  Plan   pregnancy3 Problems (from 07/10/20 to present)    Problem Noted Resolved   Short cervical length during pregnancy in second trimester 11/26/2020 by Conard Novak, MD No   Overview Signed 11/26/2020  6:15 PM by Conard Novak, MD    - per MFM 2.5-2.7 cm, vaginal progesterone until 36 weeks - MFM to follow cervical length Q 2 weeks until stable      Obesity affecting pregnancy 09/12/2020 by Conard Novak, MD No   Overview Addendum 09/12/2020 11:40 AM by Conard Novak, MD    [ ]  early hgba1c - has family planning mcd. So, can't get this lab.      Previous Version   S/P LEEP 08/25/2020 by 08/27/2020, MD No   Overview Signed 09/12/2020 11:20 AM by 09/14/2020, MD    [ ]  TV cvx length at 16 wks      Supervision of high risk pregnancy, antepartum 08/25/2020 by , MD No   Overview Addendum 09/12/2020 11:19 AM by Nadara Mustard, MD    [facility] Prenatal Labs  Dating L=9 Blood type: O/Positive/-- (12/27 0831)   Genetic Screen 1 Screen:    AFP:     Quad:     NIPS: Antibody:Negative (12/27 0831)  Anatomic 04-20-1986  Rubella: 2.13 (12/27 0831)  Varicella: immune  GTT Early:               Third trimester:  RPR: Non Reactive (12/27 0831)   Rhogam  HBsAg: Negative (  12/27 0831)   TDaP vaccine                       Flu Shot: HIV: Non Reactive (12/27 0831)   Baby Food                                GBS:   Contraception  Pap: LEEP 7/21 for CIN III, due 7/22  CBB  no   CS/VBAC n/a   Support Person Gerilyn Pilgrim, husband           Previous Version       Preterm labor symptoms and general obstetric precautions including but not limited to vaginal bleeding, contractions, leaking of fluid and  fetal movement were reviewed in detail with the patient. Please refer to After Visit Summary for other counseling recommendations.   - keep MFM appts - precautions like bleeding, pressure, fluid for coming in ASAP.   Return in about 4 weeks (around 01/15/2021) for 28 week labs and routine prenatal.   Thomasene Mohair, MD, [facility], [facility] 12/18/2020 11:14 AM

## 2020-12-22 ENCOUNTER — Telehealth: Payer: Self-pay

## 2020-12-22 NOTE — Telephone Encounter (Signed)
Pt called after hour nurse 12/20/20 11:00 am c/o 23 wks; has a slip; no bleeding and co ctxs; having lower right oabd area towards hip bone; feel straight on bottom; ground level fall, pain levvel 3/10; no meds taken, drinking fludis and urinating normally, denies redness, bruising, swelling vag gleeding/abnl d/c/leakage for fluid, dec fetal movement or any othr sxs at this time.  Per [facility] 'no reason for much concern at this point if pt did not have injury to abd directly; monitor sxs, if cause for concern and would like to come in to get checked out and hear baby's heartbeat she is more than welcome to'.  [phone]  Called pt; states everything is fine: no vag bleeding, leaking of fluid, pain; has good FM.

## 2020-12-24 ENCOUNTER — Ambulatory Visit: Payer: BC Managed Care – PPO | Admitting: *Deleted

## 2020-12-24 ENCOUNTER — Other Ambulatory Visit: Payer: Self-pay

## 2020-12-24 ENCOUNTER — Ambulatory Visit (HOSPITAL_BASED_OUTPATIENT_CLINIC_OR_DEPARTMENT_OTHER): Payer: BC Managed Care – PPO | Admitting: Obstetrics and Gynecology

## 2020-12-24 ENCOUNTER — Other Ambulatory Visit: Payer: Self-pay | Admitting: *Deleted

## 2020-12-24 ENCOUNTER — Ambulatory Visit: Payer: BC Managed Care – PPO | Attending: Obstetrics

## 2020-12-24 ENCOUNTER — Encounter: Payer: Self-pay | Admitting: *Deleted

## 2020-12-24 DIAGNOSIS — Z9889 Other specified postprocedural states: Secondary | ICD-10-CM | POA: Insufficient documentation

## 2020-12-24 DIAGNOSIS — O0990 Supervision of high risk pregnancy, unspecified, unspecified trimester: Secondary | ICD-10-CM

## 2020-12-24 DIAGNOSIS — N883 Incompetence of cervix uteri: Secondary | ICD-10-CM | POA: Insufficient documentation

## 2020-12-24 DIAGNOSIS — O3442 Maternal care for other abnormalities of cervix, second trimester: Secondary | ICD-10-CM | POA: Insufficient documentation

## 2020-12-24 DIAGNOSIS — O26872 Cervical shortening, second trimester: Secondary | ICD-10-CM | POA: Insufficient documentation

## 2020-12-24 DIAGNOSIS — O3432 Maternal care for cervical incompetence, second trimester: Secondary | ICD-10-CM | POA: Insufficient documentation

## 2020-12-24 DIAGNOSIS — Z3A23 23 weeks gestation of pregnancy: Secondary | ICD-10-CM

## 2020-12-24 DIAGNOSIS — Z8759 Personal history of other complications of pregnancy, childbirth and the puerperium: Secondary | ICD-10-CM

## 2020-12-24 DIAGNOSIS — Z362 Encounter for other antenatal screening follow-up: Secondary | ICD-10-CM

## 2020-12-24 DIAGNOSIS — O26879 Cervical shortening, unspecified trimester: Secondary | ICD-10-CM

## 2020-12-24 MED ORDER — BETAMETHASONE SOD PHOS & ACET 6 (3-3) MG/ML IJ SUSP
12.0000 mg | Freq: Once | INTRAMUSCULAR | Status: AC
  Administered 2020-12-24: 12 mg via INTRAMUSCULAR

## 2020-12-24 NOTE — Progress Notes (Signed)
Maternal-Fetal Medicine   Name: Autumn Davies DOB: [birth year] MRN: [id number] Referring Provider: Thomasene Mohair, MD  I had the pleasure of seeing [name] [facility] at [facility]. She is a G3 P1 at 23w 6d gestation. Patient returned for completion of fetal anatomy and cervical length measurement.  Obstetric history significant for a term vaginal delivery.  In July 2021, she had a LEEP.  On previous ultrasound, the cervix measured between 2.2 to 2.7 cm.  Patient does not have symptoms of uterine contractions or vaginal bleeding.  On ultrasound, amniotic fluid is normal and good fetal activity seen.  Fetal growth is appropriate for gestational age.  Fetal anatomical survey was completed and appears normal.  Cephalic presentation.  We performed transvaginal ultrasound to evaluate the cervix.  Funneling is seen in the proximal portion of the cervical canal is dilated.  The residual closed portion of the cervix measures 7 millimeters.  After explaining, I performed a sterile speculum examination.  The external os is closed.  The cervix appears about 2 cm long (visual).  Vagina appears healthy.  Cervical Incompetence I explained the finding of cervical shortening that increases the risk of preterm premature rupture membranes and preterm delivery.  In the absence of symptoms of uterine contractions, inpatient management is not necessary.  Patient was instructed on the symptoms and signs of preterm labor, and because of early gestational age, delivery is preferable at [facility].  I explained that history of LEEP is a possible cause of cervical incompetence. Overall risk for preterm delivery following LEEP is only slightly increased. The interval since LEEP is not associated with increased risk. Mid-trimester pregnancy loss can be associated with LEEP, but less common than preterm deliveries.   I discussed the benefit of antenatal corticosteroid for fetal  pulmonary maturity.  Patient agreed to take betamethasone injections.  First dose of betamethasone was administered today.  Cerclage is not performed at this gestational age.  I will discuss with Dr. Jean Rosenthal, her Ob Provider.  Consultation including face-to-face counseling: 30 minutes.  Recommendations: -Patient will be returning tomorrow morning for her second dose of betamethasone. -OB appointment in 1 to 2 weeks ((cervix to be assessed by speculum examination). -An appointment was made for a follow-up ultrasound in 4 weeks.

## 2020-12-25 ENCOUNTER — Ambulatory Visit: Payer: BC Managed Care – PPO | Admitting: *Deleted

## 2020-12-25 DIAGNOSIS — N883 Incompetence of cervix uteri: Secondary | ICD-10-CM | POA: Diagnosis not present

## 2020-12-25 MED ORDER — BETAMETHASONE SOD PHOS & ACET 6 (3-3) MG/ML IJ SUSP
12.0000 mg | Freq: Once | INTRAMUSCULAR | Status: AC
  Administered 2020-12-25: 12 mg via INTRAMUSCULAR

## 2020-12-29 ENCOUNTER — Other Ambulatory Visit: Payer: Self-pay

## 2020-12-29 ENCOUNTER — Ambulatory Visit (INDEPENDENT_AMBULATORY_CARE_PROVIDER_SITE_OTHER): Payer: BC Managed Care – PPO | Admitting: Obstetrics & Gynecology

## 2020-12-29 ENCOUNTER — Encounter (HOSPITAL_COMMUNITY): Payer: Self-pay | Admitting: Family Medicine

## 2020-12-29 ENCOUNTER — Inpatient Hospital Stay (HOSPITAL_COMMUNITY)
Admission: AD | Admit: 2020-12-29 | Discharge: 2020-12-29 | Disposition: A | Discharge status: Home / Self Care | Payer: BC Managed Care – PPO | Attending: Family Medicine | Admitting: Family Medicine

## 2020-12-29 ENCOUNTER — Encounter: Payer: Self-pay | Admitting: Obstetrics & Gynecology

## 2020-12-29 ENCOUNTER — Inpatient Hospital Stay (HOSPITAL_BASED_OUTPATIENT_CLINIC_OR_DEPARTMENT_OTHER): Payer: BC Managed Care – PPO

## 2020-12-29 VITALS — BP 100/70 | Wt 191.0 lb

## 2020-12-29 DIAGNOSIS — O99212 Obesity complicating pregnancy, second trimester: Secondary | ICD-10-CM

## 2020-12-29 DIAGNOSIS — M549 Dorsalgia, unspecified: Secondary | ICD-10-CM

## 2020-12-29 DIAGNOSIS — O09212 Supervision of pregnancy with history of pre-term labor, second trimester: Secondary | ICD-10-CM

## 2020-12-29 DIAGNOSIS — O26872 Cervical shortening, second trimester: Secondary | ICD-10-CM | POA: Diagnosis not present

## 2020-12-29 DIAGNOSIS — O99891 Other specified diseases and conditions complicating pregnancy: Secondary | ICD-10-CM | POA: Diagnosis not present

## 2020-12-29 DIAGNOSIS — Z3A24 24 weeks gestation of pregnancy: Secondary | ICD-10-CM | POA: Diagnosis not present

## 2020-12-29 DIAGNOSIS — O26892 Other specified pregnancy related conditions, second trimester: Secondary | ICD-10-CM

## 2020-12-29 DIAGNOSIS — O0990 Supervision of high risk pregnancy, unspecified, unspecified trimester: Secondary | ICD-10-CM

## 2020-12-29 DIAGNOSIS — Z9889 Other specified postprocedural states: Secondary | ICD-10-CM

## 2020-12-29 DIAGNOSIS — M545 Low back pain, unspecified: Secondary | ICD-10-CM | POA: Diagnosis not present

## 2020-12-29 DIAGNOSIS — Z131 Encounter for screening for diabetes mellitus: Secondary | ICD-10-CM

## 2020-12-29 DIAGNOSIS — O26879 Cervical shortening, unspecified trimester: Secondary | ICD-10-CM

## 2020-12-29 DIAGNOSIS — Z3689 Encounter for other specified antenatal screening: Secondary | ICD-10-CM

## 2020-12-29 DIAGNOSIS — R109 Unspecified abdominal pain: Secondary | ICD-10-CM

## 2020-12-29 LAB — POCT URINALYSIS DIPSTICK
Bilirubin, UA: NEGATIVE
Blood, UA: NEGATIVE
Glucose, UA: NEGATIVE
Ketones, UA: NEGATIVE
Leukocytes, UA: NEGATIVE
Nitrite, UA: NEGATIVE
Protein, UA: NEGATIVE
Spec Grav, UA: 1.01 (ref 1.010–1.025)
Urobilinogen, UA: 0.2 E.U./dL
pH, UA: 5 (ref 5.0–8.0)

## 2020-12-29 LAB — WET PREP, GENITAL
Clue Cells Wet Prep HPF POC: NONE SEEN
Sperm: NONE SEEN
Trich, Wet Prep: NONE SEEN
Yeast Wet Prep HPF POC: NONE SEEN

## 2020-12-29 LAB — OB RESULTS CONSOLE GC/CHLAMYDIA: Gonorrhea: NEGATIVE

## 2020-12-29 MED ORDER — ACETAMINOPHEN 500 MG PO TABS
1000.0000 mg | ORAL_TABLET | Freq: Once | ORAL | Status: AC
  Administered 2020-12-29: 1000 mg via ORAL
  Filled 2020-12-29: qty 2

## 2020-12-29 NOTE — Progress Notes (Signed)
Subjective  Fetal Movement? yes Contractions? No but does have lower abd cramping Leaking Fluid? no Vaginal Bleeding? no  Objective  BP 100/70   Wt 191 lb (86.6 kg)   LMP 07/10/2020   BMI 31.98 kg/m  General: NAD Pumonary: no increased work of breathing Abdomen: gravid, non-tender Extremities: no edema Psychiatric: mood appropriate, affect full  UA pos  Assessment  30 y.o. G3P1011 at [redacted]w[redacted]d by  04/16/2021, by Last Menstrual Period presenting for routine prenatal visit  Plan   Problem List Items Addressed This Visit      Other   S/P LEEP   Supervision of high risk pregnancy, antepartum   Obesity affecting pregnancy - Primary   Short cervical length during pregnancy in second trimester    Other Visit Diagnoses    [redacted] weeks gestation of pregnancy       Screening for diabetes mellitus       Relevant Orders   28 Week RH+Panel   Back pain affecting pregnancy in second trimester       Relevant Orders   POCT urinalysis dipstick (Completed)      pregnancy3 Problems (from 07/10/20 to present)    Problem Noted Resolved   Short cervical length during pregnancy in second trimester 11/26/2020 by Conard Novak, MD No   Overview Signed 11/26/2020  6:15 PM by Conard Novak, MD    - per MFM 2.5-2.7 cm, vaginal progesterone until 36 weeks - MFM to follow cervical length Q 2 weeks until stable      Obesity affecting pregnancy 09/12/2020 by Conard Novak, MD No   Overview Addendum 09/12/2020 11:40 AM by Conard Novak, MD    [ ]  early hgba1c - has family planning mcd. So, can't get this lab.      Previous Version   S/P LEEP 08/25/2020 by 08/27/2020, MD No   Overview Signed 09/12/2020 11:20 AM by 09/14/2020, MD    [ ]  TV cvx length at 16 wks      Supervision of high risk pregnancy, antepartum 08/25/2020 by , MD No   Overview Addendum 09/12/2020 11:19 AM by Nadara Mustard, MD    [facility] Prenatal Labs  Dating L=9 Blood  type: O/Positive/-- (12/27 0831)   Genetic Screen 1 Screen:    AFP:     Quad:     NIPS: Antibody:Negative (12/27 0831)  Anatomic 04-20-1986  Rubella: 2.13 (12/27 0831)  Varicella: immune  GTT Early:               Third trimester:  RPR: Non Reactive (12/27 0831)   Rhogam  HBsAg: Negative (12/27 0831)   TDaP vaccine                       Flu Shot: HIV: Non Reactive (12/27 0831)   Baby Food                                GBS:   Contraception  Pap: LEEP 7/21 for CIN III, due 7/22  CBB  no   CS/VBAC n/a   Support Person 8/21, husband           Macrobid for UTI      Culture sent  Cervical SPEC check stable, appears closed externally       Risks of incompetence discussed       Pelvic rest advised  [place] 3 weeks, sooner based on sx's  PNV  Glucola 3 weeks  Annamarie Major, MD, [facility], [facility] 12/29/2020  2:46 PM

## 2020-12-29 NOTE — MAU Provider Note (Addendum)
History     CSN: [id number]  Arrival date and time: 12/29/20 1801    Event Date/Time   First Provider Initiated Contact with Patient 12/29/20 1917      Chief Complaint  Patient presents with  . Abdominal Pain  . Back Pain   HPI This is a 30yo A1P3790 at [redacted]w[redacted]d with history of induced preterm delivery at 36 weeks due to severe preeclampsia and a LEEP for CIN3 on 02/2020. She had an [place] 5 days ago showing a shortened cervix at 0.7cm with funneling in the proximal portion of the cervix. She was given betamethasone x2 doses. Over the weekend, she has been having some constant lower back pain with intermittent cramping in the lower abdomen. No palliating or provoking factors. She saw her OB doctor this morning, who examined her and found a closed cervix. She was still uncomfortable and came here for evaluation.  OB History    Gravida  3   Para  1   Term  0   Preterm  1   AB  1   Living  1     SAB  0   IAB  0   Ectopic  0   Multiple  0   Live Births  1           Past Medical History:  Diagnosis Date  . Anxiety   . 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 Endometriosis determined by laparoscopy   . Migraines     Past Surgical History:  Procedure Laterality Date  . LAPAROSCOPY N/A 12/25/2015   Procedure: LAPAROSCOPY DIAGNOSTIC;  Surgeon: Conard Novak, MD;  Location: [facility] ORS;  Service: Gynecology;  Laterality: N/A;  . LEEP N/A 03/18/2020   Procedure: LOOP ELECTROSURGICAL EXCISION PROCEDURE (LEEP);  Surgeon: Conard Novak, MD;  Location: [facility] ORS;  Service: Gynecology;  Laterality: N/A;    Family History  Problem Relation Age of Onset  . Charcot-Marie-Tooth disease Father   . Charcot-Marie-Tooth disease Sister   . Heart disease Maternal Grandmother   . Hyperlipidemia Maternal Grandmother   . Heart disease Maternal Grandfather     Social History   Tobacco Use   . Smoking status: Never Smoker  . Smokeless tobacco: Never Used  Vaping Use  . Vaping Use: Never used  Substance Use Topics  . Alcohol use: No  . Drug use: No    Allergies:  Allergies  Allergen Reactions  . Citalopram Other (See Comments)    Headache  . Sulfa Antibiotics Rash    Medications Prior to Admission  Medication Sig Dispense Refill Last Dose  . Prenatal Vit-Fe Fumarate-FA (MULTIVITAMIN-PRENATAL) 27-0.8 MG TABS tablet Take 1 tablet by mouth daily at 12 noon.   12/28/2020 at Unknown time  . progesterone (PROMETRIUM) 200 MG capsule Place 1 capsule (200 mg total) vaginally daily. 60 capsule 1 12/28/2020 at Unknown time  . Doxylamine-Pyridoxine (DICLEGIS) 10-10 MG TBEC Take 2 tablets by mouth at bedtime. If symptoms persist, add one tablet in the morning and one in the afternoon 100 tablet 5     Review of Systems  All other systems reviewed and are negative.  Physical Exam   Blood pressure 119/82, pulse 91, temperature 98.2 F (36.8 C), temperature source Oral, resp. rate 16, last menstrual period 07/10/2020, SpO2 99 %.  Physical Exam Vitals reviewed. Exam conducted with a chaperone present.  Constitutional:      Appearance:  She is well-developed.  HENT:     Head: Normocephalic and atraumatic.  Cardiovascular:     Rate and Rhythm: Normal rate and regular rhythm.  Pulmonary:     Effort: Pulmonary effort is normal.  Abdominal:     General: Abdomen is flat.     Tenderness: There is no abdominal tenderness. There is no right CVA tenderness, left CVA tenderness, guarding or rebound.     Hernia: No hernia is present.  Genitourinary:    Labia:        Right: No rash, tenderness or lesion.        Left: No rash, tenderness or lesion.      Cervix: No cervical motion tenderness, discharge, friability, lesion, erythema or cervical bleeding.     Comments: Cervix appears to be 2cm thick. Visually closed.  Digital exam done: Dilation: Closed Exam by:: stinson,md  Skin:     General: Skin is warm and dry.  Neurological:     Mental Status: She is alert.       MAU Course  Procedures NST: Baseline 140, moderate variability, no accels, no decels   MDM Wet prep, GC/CT. Will check CL by [place]. Unable to get FFN due to earlier pelvic exam.  Patient turned over to Gerrit Heck, CNM at 2000.  Assessment and Plan  Reassessment (8:21 PM)  -Preliminary report shows CL at .9cm -Provider to bedside to discuss results. -Patient informed that final report would be complete in the AM and any major modifications she would be contacted. -Informed of findings and reviewed how this may be technician variation, but could conclude that no major negative change. -Addressed concerns regarding intermittent cramping and back pain.  Discussed relief measures including tylenol usage, warm compresses, and maternity belt. -Patient offered and accepts tylenol now. Will give. -Encouraged to discuss usage of muscle relaxant with primary ob.  Informed that due to driving self, would not give dosage here tonight.  -NST reactive. -Encouraged to call or return to MAU if symptoms worsen or with the onset of new symptoms. -Discharged to home in stable condition.  Cherre Robins MSN, CNM Advanced Practice Provider, [facility] [employer]

## 2020-12-29 NOTE — Discharge Instructions (Signed)
Back Pain in Pregnancy Back pain during pregnancy is common. Back pain may be caused by several factors that are related to changes during your pregnancy. Follow these instructions at home: Managing pain, stiffness, and swelling  If directed, for sudden (acute) back pain, put ice on the painful area. ? Put ice in a plastic bag. ? Place a towel between your skin and the bag. ? Leave the ice on for 20 minutes, 2-3 times per day.  If directed, apply heat to the affected area before you exercise. Use the heat source that your health care provider recommends, such as a moist heat pack or a heating pad. ? Place a towel between your skin and the heat source. ? Leave the heat on for 20-30 minutes. ? Remove the heat if your skin turns bright red. This is especially important if you are unable to feel pain, heat, or cold. You may have a greater risk of getting burned.  If directed, massage the affected area.      Activity  Exercise as told by your health care provider. Gentle exercise is the best way to prevent or manage back pain.  Listen to your body when lifting. If lifting hurts, ask for help or bend your knees. This uses your leg muscles instead of your back muscles.  Squat down when picking up something from the floor. Do not bend over.  Only use bed rest for short periods as told by your health care provider. Bed rest should only be used for the most severe episodes of back pain. Standing, sitting, and lying down  Do not stand in one place for long periods of time.  Use good posture when sitting. Make sure your head rests over your shoulders and is not hanging forward. Use a pillow on your lower back if necessary.  Try sleeping on your side, preferably the left side, with a pregnancy support pillow or 1-2 regular pillows between your legs. ? If you have back pain after a night's rest, your bed may be too soft. ? A firm mattress may provide more support for your back during  pregnancy. General instructions  Do not wear high heels.  Eat a healthy diet. Try to gain weight within your health care provider's recommendations.  Use a maternity girdle, elastic sling, or back brace as told by your health care provider.  Take over-the-counter and prescription medicines only as told by your health care provider.  Work with a physical therapist or massage therapist to find ways to manage back pain. Acupuncture or massage therapy may be helpful.  Keep all follow-up visits as told by your health care provider. This is important. Contact a health care provider if:  Your back pain interferes with your daily activities.  You have increasing pain in other parts of your body. Get help right away if:  You develop numbness, tingling, weakness, or problems with the use of your arms or legs.  You develop severe back pain that is not controlled with medicine.  You have a change in bowel or bladder control.  You develop shortness of breath, dizziness, or you faint.  You develop nausea, vomiting, or sweating.  You have back pain that is a rhythmic, cramping pain similar to labor pains. Labor pain is usually 1-2 minutes apart, lasts for about 1 minute, and involves a bearing down feeling or pressure in your pelvis.  You have back pain and your water breaks or you have vaginal bleeding.  You have back pain or   numbness that travels down your leg.  Your back pain developed after you fell.  You develop pain on one side of your back.  You see blood in your urine.  You develop skin blisters in the area of your back pain. Summary  Back pain may be caused by several factors that are related to changes during your pregnancy.  Follow instructions as told by your health care provider for managing pain, stiffness, and swelling.  Exercise as told by your health care provider. Gentle exercise is the best way to prevent or manage back pain.  Take over-the-counter and  prescription medicines only as told by your health care provider.  Keep all follow-up visits as told by your health care provider. This is important. This information is not intended to replace advice given to you by your health care provider. Make sure you discuss any questions you have with your health care provider. Document Revised: 12/05/2018 Document Reviewed: 02/01/2018 Elsevier Patient Education  2021 Elsevier Inc.  

## 2020-12-29 NOTE — Patient Instructions (Signed)
Thank you for choosing [facility]. As part of our ongoing efforts to improve patient experience, we would appreciate your feedback. Please fill out the short survey that you will receive by mail or MyChart. Your opinion is important to us! -Dr Celestino Ackerman  Second Trimester of Pregnancy  The second trimester of pregnancy is from week 13 through week 27. This is months 4 through 6 of pregnancy. The second trimester is often a time when you feel your best. Your body has adjusted to being pregnant, and you begin to feel better physically. During the second trimester:  Morning sickness has lessened or stopped completely.  You may have more energy.  You may have an increase in appetite. The second trimester is also a time when the unborn baby (fetus) is growing rapidly. At the end of the sixth month, the fetus may be up to 12 inches long and weigh about 1 pounds. You will likely begin to feel the baby move (quickening) between 16 and 20 weeks of pregnancy. Body changes during your second trimester Your body continues to go through many changes during your second trimester. The changes vary and generally return to normal after the baby is born. Physical changes  Your weight will continue to increase. You will notice your lower abdomen bulging out.  You may begin to get stretch marks on your hips, abdomen, and breasts.  Your breasts will continue to grow and to become tender.  Dark spots or blotches (chloasma or mask of pregnancy) may develop on your face.  A dark line from your belly button to the pubic area (linea nigra) may appear.  You may have changes in your hair. These can include thickening of your hair, rapid growth, and changes in texture. Some people also have hair loss during or after pregnancy, or hair that feels dry or thin. Health changes  You may develop headaches.  You may have heartburn.  You may develop constipation.  You may develop hemorrhoids or swollen, bulging  veins (varicose veins).  Your gums may bleed and may be sensitive to brushing and flossing.  You may urinate more often because the fetus is pressing on your bladder.  You may have back pain. This is caused by: ? Weight gain. ? Pregnancy hormones that are relaxing the joints in your pelvis. ? A shift in weight and the muscles that support your balance. Follow these instructions at home: Medicines  Follow your health care provider's instructions regarding medicine use. Specific medicines may be either safe or unsafe to take during pregnancy. Do not take any medicines unless approved by your health care provider.  Take a prenatal vitamin that contains at least 600 micrograms (mcg) of folic acid. Eating and drinking  Eat a healthy diet that includes fresh fruits and vegetables, whole grains, good sources of protein such as meat, eggs, or tofu, and low-fat dairy products.  Avoid raw meat and unpasteurized juice, milk, and cheese. These carry germs that can harm you and your baby.  You may need to take these actions to prevent or treat constipation: ? Drink enough fluid to keep your urine pale yellow. ? Eat foods that are high in fiber, such as beans, whole grains, and fresh fruits and vegetables. ? Limit foods that are high in fat and processed sugars, such as fried or sweet foods. Activity  Exercise only as directed by your health care provider. Most people can continue their usual exercise routine during pregnancy. Try to exercise for 30 minutes at least   5 days a week. Stop exercising if you develop contractions in your uterus.  Stop exercising if you develop pain or cramping in the lower abdomen or lower back.  Avoid exercising if it is very hot or humid or if you are at a high altitude.  Avoid heavy lifting.  If you choose to, you may have sex unless your health care provider tells you not to. Relieving pain and discomfort  Wear a supportive bra to prevent discomfort from  breast tenderness.  Take warm sitz baths to soothe any pain or discomfort caused by hemorrhoids. Use hemorrhoid cream if your health care provider approves.  Rest with your legs raised (elevated) if you have leg cramps or low back pain.  If you develop varicose veins: ? Wear support hose as told by your health care provider. ? Elevate your feet for 15 minutes, 3-4 times a day. ? Limit salt in your diet. Safety  Wear your seat belt at all times when driving or riding in a car.  Talk with your health care provider if someone is verbally or physically abusive to you. Lifestyle  Do not use hot tubs, steam rooms, or saunas.  Do not douche. Do not use tampons or scented sanitary pads.  Avoid cat litter boxes and soil used by cats. These carry germs that can cause birth defects in the baby and possibly loss of the fetus by miscarriage or stillbirth.  Do not use herbal remedies, alcohol, illegal drugs, or medicines that are not approved by your health care provider. Chemicals in these products can harm your baby.  Do not use any products that contain nicotine or tobacco, such as cigarettes, e-cigarettes, and chewing tobacco. If you need help quitting, ask your health care provider. General instructions  During a routine prenatal visit, your health care provider will do a physical exam and other tests. He or she will also discuss your overall health. Keep all follow-up visits. This is important.  Ask your health care provider for a referral to a local prenatal education class.  Ask for help if you have counseling or nutritional needs during pregnancy. Your health care provider can offer advice or refer you to specialists for help with various needs. Where to find more information  American Pregnancy Association: americanpregnancy.org  American College of Obstetricians and Gynecologists: acog.org/en/Womens%20Health/Pregnancy  Office on Women's Health: womenshealth.gov/pregnancy Contact  a health care provider if you have:  A headache that does not go away when you take medicine.  Vision changes or you see spots in front of your eyes.  Mild pelvic cramps, pelvic pressure, or nagging pain in the abdominal area.  Persistent nausea, vomiting, or diarrhea.  A bad-smelling vaginal discharge or foul-smelling urine.  Pain when you urinate.  Sudden or extreme swelling of your face, hands, ankles, feet, or legs.  A fever. Get help right away if you:  Have fluid leaking from your vagina.  Have spotting or bleeding from your vagina.  Have severe abdominal cramping or pain.  Have difficulty breathing.  Have chest pain.  Have fainting spells.  Have not felt your baby move for the time period told by your health care provider.  Have new or increased pain, swelling, or redness in an arm or leg. Summary  The second trimester of pregnancy is from week 13 through week 27 (months 4 through 6).  Do not use herbal remedies, alcohol, illegal drugs, or medicines that are not approved by your health care provider. Chemicals in these products can   harm your baby.  Exercise only as directed by your health care provider. Most people can continue their usual exercise routine during pregnancy.  Keep all follow-up visits. This is important. This information is not intended to replace advice given to you by your health care provider. Make sure you discuss any questions you have with your health care provider. Document Revised: 01/23/2020 Document Reviewed: 11/29/2019 Elsevier Patient Education  2021 Elsevier Inc.  

## 2020-12-29 NOTE — MAU Note (Signed)
...  Autumn Davies is a 30 y.o. at [redacted]w[redacted]d here in MAU reporting: lower abdominal cramping and lower back pain since this morning. Patient states she had an upset stomach yesterday with diarrhea. She states she felt bilateral sharp pains yesterday evening but none today. Diarrhea has also ceased today. +FM. No VB or LOF.   Pain score:  4/10 mid lower abdomen 5/10 mid lower back  Lab orders placed from triage:  UA

## 2020-12-30 DIAGNOSIS — O26872 Cervical shortening, second trimester: Secondary | ICD-10-CM

## 2020-12-30 DIAGNOSIS — Z3A24 24 weeks gestation of pregnancy: Secondary | ICD-10-CM

## 2020-12-30 LAB — GC/CHLAMYDIA PROBE AMP (~~LOC~~) NOT AT ARMC
Chlamydia: NEGATIVE
Comment: NEGATIVE
Comment: NORMAL
Neisseria Gonorrhea: NEGATIVE

## 2020-12-31 LAB — URINE CULTURE

## 2021-01-12 ENCOUNTER — Ambulatory Visit (INDEPENDENT_AMBULATORY_CARE_PROVIDER_SITE_OTHER): Payer: BC Managed Care – PPO | Admitting: Obstetrics and Gynecology

## 2021-01-12 ENCOUNTER — Encounter: Payer: Self-pay | Admitting: Obstetrics and Gynecology

## 2021-01-12 ENCOUNTER — Other Ambulatory Visit: Payer: Self-pay

## 2021-01-12 VITALS — BP 118/74 | Wt 199.0 lb

## 2021-01-12 DIAGNOSIS — O0992 Supervision of high risk pregnancy, unspecified, second trimester: Secondary | ICD-10-CM

## 2021-01-12 DIAGNOSIS — Z3A26 26 weeks gestation of pregnancy: Secondary | ICD-10-CM

## 2021-01-12 DIAGNOSIS — O26872 Cervical shortening, second trimester: Secondary | ICD-10-CM

## 2021-01-12 DIAGNOSIS — Z9889 Other specified postprocedural states: Secondary | ICD-10-CM

## 2021-01-12 DIAGNOSIS — O99212 Obesity complicating pregnancy, second trimester: Secondary | ICD-10-CM

## 2021-01-12 NOTE — Progress Notes (Signed)
No vb. Pt having some leaking of "clear" fluid, very slow.

## 2021-01-12 NOTE — Progress Notes (Signed)
Routine Prenatal Care Visit  Subjective  Autumn Davies is a 30 y.o. [phone] at [redacted]w[redacted]d being seen today for ongoing prenatal care.  She is currently monitored for the following issues for this high-risk pregnancy and has Pelvic pain in female; Endometriosis determined by laparoscopy; CMT (Charcot-Marie-Tooth disease); Menometrorrhagia; CIN III (cervical intraepithelial neoplasia grade III) with severe dysplasia; S/P LEEP; Supervision of high risk pregnancy, antepartum; Obesity affecting pregnancy; and Short cervical length during pregnancy in second trimester on their problem list.  ----------------------------------------------------------------------------------- Patient reports wet underwear yesterday and the day before. No big gushes of fluid. Fluid appeared clear. Not running down leg.   Contractions: Not present. Vag. Bleeding: None.  Movement: Present. Leaking Fluid: unsure.  ----------------------------------------------------------------------------------- The following portions of the patient's history were reviewed and updated as appropriate: allergies, current medications, past family history, past medical history, past social history, past surgical history and problem list. Problem list updated.  Objective  Blood pressure 118/74, weight 199 lb (90.3 kg), last menstrual period 07/10/2020. Pregravid weight 194 lb (88 kg) Total Weight Gain 5 lb (2.268 kg) Urinalysis: Urine Protein    Urine Glucose    Fetal Status: Fetal Heart Rate (bpm): 150 Fundal Height: 26 cm Movement: Present     General:  Alert, oriented and cooperative. Patient is in no acute distress.  Skin: Skin is warm and dry. No rash noted.   Cardiovascular: Normal heart rate noted  Respiratory: Normal respiratory effort, no problems with respiration noted  Abdomen: Soft, gravid, appropriate for gestational age. Pain/Pressure: Absent     Pelvic:  Cervical exam deferred Dilation: Closed Effacement (%): 60 Station:  Ballotable  Extremities: Normal range of motion.  Edema: None  Mental Status: Normal mood and affect. Normal behavior. Normal judgment and thought content.   ROM workup Pooling: negative Nitrazine: negative Ferning: negative  Assessment   30 y.o. [id number] at [redacted]w[redacted]d by  04/16/2021, by Last Menstrual Period presenting for routine prenatal visit  Plan   pregnancy3 Problems (from 07/10/20 to present)    Problem Noted Resolved   Short cervical length during pregnancy in second trimester 11/26/2020 by Conard Novak, MD No   Overview Addendum 01/12/2021  4:42 PM by Conard Novak, MD    - per MFM 2.5-2.7 cm, vaginal progesterone until 36 weeks - MFM to follow cervical length Q 2 weeks until stable - 0.9 cm in early May. No further ultrasound recommended, but there is a follow up scheduled. - s/p BMTZ - vaginal progesterone weekly until 36 weeks. - drop in cervical length of 2.6 cm in early April to 9 mm in late April      Previous Version   Obesity affecting pregnancy 09/12/2020 by Conard Novak, MD No   Overview Addendum 09/12/2020 11:40 AM by Conard Novak, MD    [ ]  early hgba1c - has family planning mcd. So, can't get this lab.      Previous Version   S/P LEEP 08/25/2020 by 08/27/2020, MD No   Overview Addendum 01/12/2021  4:44 PM by 01/14/2021, MD    [x]  TV cvx length at 16 wks - see shortened cervix problem      Previous Version   Supervision of high risk pregnancy, antepartum 08/25/2020 by , MD No   Overview Addendum 09/12/2020 11:19 AM by Nadara Mustard, MD    Clinic [facility] Prenatal Labs  Dating L=9 Blood type: O/Positive/-- (12/27 0831)   Genetic Screen 1 Screen:  AFP:     Quad:     NIPS: Antibody:Negative (12/27 0831)  Anatomic [place]  Rubella: 2.13 (12/27 0831)  Varicella: immune  GTT Early:               Third trimester:  RPR: Non Reactive (12/27 0831)   Rhogam  HBsAg: Negative (12/27 0831)   TDaP vaccine                        Flu Shot: HIV: Non Reactive (12/27 0831)   Baby Food                                GBS:   Contraception  Pap: LEEP 7/21 for CIN III, due 7/22  CBB  no   CS/VBAC n/a   Support Person Gerilyn Pilgrim, husband           Previous Version       Preterm labor symptoms and general obstetric precautions including but not limited to vaginal bleeding, contractions, leaking of fluid and fetal movement were reviewed in detail with the patient. Please refer to After Visit Summary for other counseling recommendations.   - keep appointment in one week - scheduled follow up for 2 weeks - keep all u/s appts  Return in about 3 weeks (around 02/02/2021) for Routine Prenatal Appointment.   Thomasene Mohair, MD, [facility], [facility] 01/12/2021 5:06 PM

## 2021-01-19 ENCOUNTER — Encounter: Payer: Self-pay | Admitting: Obstetrics and Gynecology

## 2021-01-19 ENCOUNTER — Other Ambulatory Visit: Payer: BC Managed Care – PPO

## 2021-01-19 ENCOUNTER — Ambulatory Visit (INDEPENDENT_AMBULATORY_CARE_PROVIDER_SITE_OTHER): Payer: BC Managed Care – PPO | Admitting: Obstetrics and Gynecology

## 2021-01-19 ENCOUNTER — Other Ambulatory Visit: Payer: Self-pay

## 2021-01-19 VITALS — BP 112/64 | Wt 199.0 lb

## 2021-01-19 DIAGNOSIS — Z113 Encounter for screening for infections with a predominantly sexual mode of transmission: Secondary | ICD-10-CM

## 2021-01-19 DIAGNOSIS — O99213 Obesity complicating pregnancy, third trimester: Secondary | ICD-10-CM

## 2021-01-19 DIAGNOSIS — O26872 Cervical shortening, second trimester: Secondary | ICD-10-CM

## 2021-01-19 DIAGNOSIS — O0992 Supervision of high risk pregnancy, unspecified, second trimester: Secondary | ICD-10-CM

## 2021-01-19 DIAGNOSIS — Z131 Encounter for screening for diabetes mellitus: Secondary | ICD-10-CM

## 2021-01-19 DIAGNOSIS — O0993 Supervision of high risk pregnancy, unspecified, third trimester: Secondary | ICD-10-CM

## 2021-01-19 DIAGNOSIS — Z3A27 27 weeks gestation of pregnancy: Secondary | ICD-10-CM

## 2021-01-19 LAB — POCT URINALYSIS DIPSTICK OB
Glucose, UA: NEGATIVE
POC,PROTEIN,UA: NEGATIVE

## 2021-01-19 NOTE — Progress Notes (Signed)
Routine Prenatal Care Visit  Subjective  Autumn Davies is a 30 y.o. [phone] at [redacted]w[redacted]d being seen today for ongoing prenatal care.  She is currently monitored for the following issues for this high-risk pregnancy and has Pelvic pain in female; Endometriosis determined by laparoscopy; CMT (Charcot-Marie-Tooth disease); Menometrorrhagia; CIN III (cervical intraepithelial neoplasia grade III) with severe dysplasia; S/P LEEP; Supervision of high risk pregnancy, antepartum; Obesity affecting pregnancy; and Short cervical length during pregnancy in second trimester on their problem list.  ----------------------------------------------------------------------------------- Patient reports no complaints.   Contractions: Not present. Vag. Bleeding: None.  Movement: Present. Leaking Fluid denies. Still has some liquid discharge.  No big gushes of fluid. No abnormal color or smell. No big changes from last visit where she had a negative ROM workup.  ----------------------------------------------------------------------------------- The following portions of the patient's history were reviewed and updated as appropriate: allergies, current medications, past family history, past medical history, past social history, past surgical history and problem list. Problem list updated.  Objective  Blood pressure 112/64, weight 199 lb (90.3 kg), last menstrual period 07/10/2020. Pregravid weight 194 lb (88 kg) Total Weight Gain 5 lb (2.268 kg) Urinalysis: Urine Protein Negative  Urine Glucose Negative  Fetal Status: Fetal Heart Rate (bpm): 130 Fundal Height: 37 cm Movement: Present     General:  Alert, oriented and cooperative. Patient is in no acute distress.  Skin: Skin is warm and dry. No rash noted.   Cardiovascular: Normal heart rate noted  Respiratory: Normal respiratory effort, no problems with respiration noted  Abdomen: Soft, gravid, appropriate for gestational age. Pain/Pressure: Absent     Pelvic:   Cervical exam deferred        Extremities: Normal range of motion.  Edema: None  Mental Status: Normal mood and affect. Normal behavior. Normal judgment and thought content.   Assessment   30 y.o. [id number] at [redacted]w[redacted]d by  04/16/2021, by Last Menstrual Period presenting for routine prenatal visit  Plan   pregnancy3 Problems (from 07/10/20 to present)    Problem Noted Resolved   Short cervical length during pregnancy in second trimester 11/26/2020 by Conard Novak, MD No   Overview Addendum 01/12/2021  4:42 PM by Conard Novak, MD    - per MFM 2.5-2.7 cm, vaginal progesterone until 36 weeks - MFM to follow cervical length Q 2 weeks until stable - 0.9 cm in early May. No further ultrasound recommended, but there is a follow up scheduled. - s/p BMTZ - vaginal progesterone weekly until 36 weeks. - drop in cervical length of 2.6 cm in early April to 9 mm in late April      Previous Version   Obesity affecting pregnancy 09/12/2020 by Conard Novak, MD No   Overview Addendum 09/12/2020 11:40 AM by Conard Novak, MD    [ ]  early hgba1c - has family planning mcd. So, can't get this lab.      Previous Version   S/P LEEP 08/25/2020 by 08/27/2020, MD No   Overview Addendum 01/12/2021  4:44 PM by 01/14/2021, MD    [x]  TV cvx length at 16 wks - see shortened cervix problem      Previous Version   Supervision of high risk pregnancy, antepartum 08/25/2020 by , MD No   Overview Addendum 09/12/2020 11:19 AM by Nadara Mustard, MD    Clinic [facility] Prenatal Labs  Dating L=9 Blood type: O/Positive/-- (12/27 0831)   Genetic Screen 1 Screen:  AFP:     Quad:     NIPS: Antibody:Negative (12/27 0831)  Anatomic [place]  Rubella: 2.13 (12/27 0831)  Varicella: immune  GTT Early:               Third trimester:  RPR: Non Reactive (12/27 0831)   Rhogam  HBsAg: Negative (12/27 0831)   TDaP vaccine                       Flu Shot: HIV: Non Reactive (12/27 0831)    Baby Food                                GBS:   Contraception  Pap: LEEP 7/21 for CIN III, due 7/22  CBB  no   CS/VBAC n/a   Support Person Gerilyn Pilgrim, husband           Previous Version       Preterm labor symptoms and general obstetric precautions including but not limited to vaginal bleeding, contractions, leaking of fluid and fetal movement were reviewed in detail with the patient. Please refer to After Visit Summary for other counseling recommendations.   - keep u/s follow up in 2 days   Return in about 2 weeks (around 02/02/2021) for Routine Prenatal Appointment.   Thomasene Mohair, MD, [facility], [facility] 01/19/2021 9:23 AM

## 2021-01-20 LAB — 28 WEEK RH+PANEL
Basophils Absolute: 0.1 10*3/uL (ref 0.0–0.2)
Basos: 1 %
EOS (ABSOLUTE): 0.1 10*3/uL (ref 0.0–0.4)
Eos: 1 %
Gestational Diabetes Screen: 100 mg/dL (ref 65–139)
HIV Screen 4th Generation wRfx: NONREACTIVE
Hematocrit: 38.1 % (ref 34.0–46.6)
Hemoglobin: 12.8 g/dL (ref 11.1–15.9)
Immature Grans (Abs): 0.1 10*3/uL (ref 0.0–0.1)
Immature Granulocytes: 1 %
Lymphocytes Absolute: 1.8 10*3/uL (ref 0.7–3.1)
Lymphs: 18 %
MCH: 30.4 pg (ref 26.6–33.0)
MCHC: 33.6 g/dL (ref 31.5–35.7)
MCV: 91 fL (ref 79–97)
Monocytes Absolute: 0.9 10*3/uL (ref 0.1–0.9)
Monocytes: 9 %
Neutrophils Absolute: 7 10*3/uL (ref 1.4–7.0)
Neutrophils: 70 %
Platelets: 240 10*3/uL (ref 150–450)
RBC: 4.21 x10E6/uL (ref 3.77–5.28)
RDW: 13.2 % (ref 11.7–15.4)
RPR Ser Ql: NONREACTIVE
WBC: 9.9 10*3/uL (ref 3.4–10.8)

## 2021-01-21 ENCOUNTER — Encounter: Payer: Self-pay | Admitting: *Deleted

## 2021-01-21 ENCOUNTER — Other Ambulatory Visit: Payer: Self-pay

## 2021-01-21 ENCOUNTER — Other Ambulatory Visit: Payer: Self-pay | Admitting: Obstetrics and Gynecology

## 2021-01-21 ENCOUNTER — Ambulatory Visit: Payer: BC Managed Care – PPO | Attending: Obstetrics and Gynecology

## 2021-01-21 ENCOUNTER — Ambulatory Visit: Payer: BC Managed Care – PPO | Admitting: *Deleted

## 2021-01-21 DIAGNOSIS — O0990 Supervision of high risk pregnancy, unspecified, unspecified trimester: Secondary | ICD-10-CM | POA: Diagnosis present

## 2021-01-21 DIAGNOSIS — N879 Dysplasia of cervix uteri, unspecified: Secondary | ICD-10-CM

## 2021-01-21 DIAGNOSIS — Z9889 Other specified postprocedural states: Secondary | ICD-10-CM | POA: Insufficient documentation

## 2021-01-21 DIAGNOSIS — O26872 Cervical shortening, second trimester: Secondary | ICD-10-CM

## 2021-01-21 DIAGNOSIS — Z3A27 27 weeks gestation of pregnancy: Secondary | ICD-10-CM

## 2021-01-21 DIAGNOSIS — O26873 Cervical shortening, third trimester: Secondary | ICD-10-CM

## 2021-01-21 DIAGNOSIS — O26879 Cervical shortening, unspecified trimester: Secondary | ICD-10-CM | POA: Diagnosis present

## 2021-01-21 DIAGNOSIS — O3442 Maternal care for other abnormalities of cervix, second trimester: Secondary | ICD-10-CM

## 2021-01-21 DIAGNOSIS — O321XX0 Maternal care for breech presentation, not applicable or unspecified: Secondary | ICD-10-CM

## 2021-01-21 DIAGNOSIS — Z362 Encounter for other antenatal screening follow-up: Secondary | ICD-10-CM

## 2021-01-21 DIAGNOSIS — Z8759 Personal history of other complications of pregnancy, childbirth and the puerperium: Secondary | ICD-10-CM

## 2021-02-05 ENCOUNTER — Other Ambulatory Visit: Payer: Self-pay

## 2021-02-06 ENCOUNTER — Encounter: Payer: Self-pay | Admitting: Obstetrics and Gynecology

## 2021-02-06 ENCOUNTER — Ambulatory Visit (INDEPENDENT_AMBULATORY_CARE_PROVIDER_SITE_OTHER): Payer: BC Managed Care – PPO | Admitting: Obstetrics and Gynecology

## 2021-02-06 VITALS — BP 120/80 | Wt 203.0 lb

## 2021-02-06 DIAGNOSIS — Z3A30 30 weeks gestation of pregnancy: Secondary | ICD-10-CM

## 2021-02-06 DIAGNOSIS — O26873 Cervical shortening, third trimester: Secondary | ICD-10-CM

## 2021-02-06 DIAGNOSIS — Z23 Encounter for immunization: Secondary | ICD-10-CM

## 2021-02-06 DIAGNOSIS — O99213 Obesity complicating pregnancy, third trimester: Secondary | ICD-10-CM

## 2021-02-06 DIAGNOSIS — O0993 Supervision of high risk pregnancy, unspecified, third trimester: Secondary | ICD-10-CM

## 2021-02-06 NOTE — Progress Notes (Signed)
Routine Prenatal Care Visit  Subjective  Autumn Davies is a 30 y.o. [phone] at [redacted]w[redacted]d being seen today for ongoing prenatal care.  She is currently monitored for the following issues for this high-risk pregnancy and has Pelvic pain in female; Endometriosis determined by laparoscopy; CMT (Charcot-Marie-Tooth disease); Menometrorrhagia; CIN III (cervical intraepithelial neoplasia grade III) with severe dysplasia; S/P LEEP; Supervision of high risk pregnancy, antepartum; Obesity affecting pregnancy; and Short cervical length during pregnancy in second trimester on their problem list.  ----------------------------------------------------------------------------------- Patient reports no complaints.   Contractions: Not present. Vag. Bleeding: None.  Movement: Present. Leaking Fluid denies.  Reports pelvic pressure. Last ultrasound nearly 2 weeks ago showed a relatively stable cervix.  ----------------------------------------------------------------------------------- The following portions of the patient's history were reviewed and updated as appropriate: allergies, current medications, past family history, past medical history, past social history, past surgical history and problem list. Problem list updated.  Objective  Blood pressure 120/80, weight 203 lb (92.1 kg), last menstrual period 07/10/2020. Pregravid weight 194 lb (88 kg) Total Weight Gain 9 lb (4.082 kg) Urinalysis: Urine Protein    Urine Glucose    Fetal Status: Fetal Heart Rate (bpm): 150 Fundal Height: 30 cm Movement: Present     General:  Alert, oriented and cooperative. Patient is in no acute distress.  Skin: Skin is warm and dry. No rash noted.   Cardiovascular: Normal heart rate noted  Respiratory: Normal respiratory effort, no problems with respiration noted  Abdomen: Soft, gravid, appropriate for gestational age. Pain/Pressure: Present     Pelvic:  Cervical exam performed      SSE: cervix visually about 2 cm and appears  closed. No digital exam performed.   Extremities: Normal range of motion.  Edema: None  Mental Status: Normal mood and affect. Normal behavior. Normal judgment and thought content.   Female chaperone present for pelvic exam:   Assessment   30 y.o. [id number] at [redacted]w[redacted]d by  04/16/2021, by Last Menstrual Period presenting for routine prenatal visit  Plan   pregnancy3 Problems (from 07/10/20 to present)     Problem Noted Resolved   Short cervical length during pregnancy in second trimester 11/26/2020 by Conard Novak, MD No   Overview Addendum 01/12/2021  4:42 PM by Conard Novak, MD    - per MFM 2.5-2.7 cm, vaginal progesterone until 36 weeks - MFM to follow cervical length Q 2 weeks until stable - 0.9 cm in early May. No further ultrasound recommended, but there is a follow up scheduled. - s/p BMTZ - vaginal progesterone weekly until 36 weeks. - drop in cervical length of 2.6 cm in early April to 9 mm in late April       Obesity affecting pregnancy 09/12/2020 by Conard Novak, MD No   Overview Addendum 09/12/2020 11:40 AM by Conard Novak, MD    [ ]  early hgba1c - has family planning mcd. So, can't get this lab.       S/P LEEP 08/25/2020 by 08/27/2020, MD No   Overview Addendum 01/12/2021  4:44 PM by 01/14/2021, MD    [x]  TV cvx length at 16 wks - see shortened cervix problem       Supervision of high risk pregnancy, antepartum 08/25/2020 by , MD No   Overview Addendum 09/12/2020 11:19 AM by Nadara Mustard, MD    Clinic [facility] Prenatal Labs  Dating L=9 Blood type: O/Positive/-- (12/27 0831)   Genetic Screen 1 Screen:  AFP:     Quad:     NIPS: Antibody:Negative (12/27 0831)  Anatomic [place]  Rubella: 2.13 (12/27 0831)  Varicella: immune  GTT Early:               Third trimester:  RPR: Non Reactive (12/27 0831)   Rhogam  HBsAg: Negative (12/27 0831)   TDaP vaccine                       Flu Shot: HIV: Non Reactive (12/27 0831)    Baby Food                                GBS:   Contraception  Pap: LEEP 7/21 for CIN III, due 7/22  CBB  no   CS/VBAC n/a   Support Person Gerilyn Pilgrim, husband                 Preterm labor symptoms and general obstetric precautions including but not limited to vaginal bleeding, contractions, leaking of fluid and fetal movement were reviewed in detail with the patient. Please refer to After Visit Summary for other counseling recommendations.   - TDaP today  Return in about 2 weeks (around 02/20/2021) for Routine Prenatal Appointment.   Thomasene Mohair, MD, [facility], [facility] 02/06/2021 9:29 AM

## 2021-02-12 ENCOUNTER — Ambulatory Visit: Payer: BC Managed Care – PPO | Admitting: *Deleted

## 2021-02-12 ENCOUNTER — Ambulatory Visit: Payer: BC Managed Care – PPO | Attending: Obstetrics and Gynecology

## 2021-02-12 ENCOUNTER — Other Ambulatory Visit: Payer: Self-pay

## 2021-02-12 ENCOUNTER — Encounter: Payer: Self-pay | Admitting: *Deleted

## 2021-02-12 VITALS — BP 111/65 | HR 87

## 2021-02-12 DIAGNOSIS — Z8759 Personal history of other complications of pregnancy, childbirth and the puerperium: Secondary | ICD-10-CM

## 2021-02-12 DIAGNOSIS — N879 Dysplasia of cervix uteri, unspecified: Secondary | ICD-10-CM | POA: Diagnosis not present

## 2021-02-12 DIAGNOSIS — Z9889 Other specified postprocedural states: Secondary | ICD-10-CM | POA: Diagnosis present

## 2021-02-12 DIAGNOSIS — O3443 Maternal care for other abnormalities of cervix, third trimester: Secondary | ICD-10-CM | POA: Diagnosis not present

## 2021-02-12 DIAGNOSIS — O0990 Supervision of high risk pregnancy, unspecified, unspecified trimester: Secondary | ICD-10-CM | POA: Diagnosis present

## 2021-02-12 DIAGNOSIS — O321XX0 Maternal care for breech presentation, not applicable or unspecified: Secondary | ICD-10-CM

## 2021-02-12 DIAGNOSIS — O26873 Cervical shortening, third trimester: Secondary | ICD-10-CM | POA: Insufficient documentation

## 2021-02-12 DIAGNOSIS — O26872 Cervical shortening, second trimester: Secondary | ICD-10-CM

## 2021-02-12 DIAGNOSIS — Z3A31 31 weeks gestation of pregnancy: Secondary | ICD-10-CM

## 2021-02-15 ENCOUNTER — Other Ambulatory Visit: Payer: Self-pay

## 2021-02-15 ENCOUNTER — Observation Stay
Admission: EM | Admit: 2021-02-15 | Discharge: 2021-02-15 | Disposition: A | Discharge status: Home / Self Care | Payer: BC Managed Care – PPO | Attending: Obstetrics and Gynecology | Admitting: Obstetrics and Gynecology

## 2021-02-15 ENCOUNTER — Encounter: Payer: Self-pay | Admitting: Obstetrics and Gynecology

## 2021-02-15 DIAGNOSIS — O99210 Obesity complicating pregnancy, unspecified trimester: Secondary | ICD-10-CM | POA: Diagnosis present

## 2021-02-15 DIAGNOSIS — O26872 Cervical shortening, second trimester: Secondary | ICD-10-CM | POA: Diagnosis present

## 2021-02-15 DIAGNOSIS — O99213 Obesity complicating pregnancy, third trimester: Secondary | ICD-10-CM

## 2021-02-15 DIAGNOSIS — Z3A31 31 weeks gestation of pregnancy: Secondary | ICD-10-CM | POA: Diagnosis not present

## 2021-02-15 DIAGNOSIS — Z9889 Other specified postprocedural states: Secondary | ICD-10-CM

## 2021-02-15 DIAGNOSIS — O4693 Antepartum hemorrhage, unspecified, third trimester: Principal | ICD-10-CM | POA: Insufficient documentation

## 2021-02-15 DIAGNOSIS — O0001 Abdominal pregnancy with intrauterine pregnancy: Secondary | ICD-10-CM | POA: Diagnosis not present

## 2021-02-15 DIAGNOSIS — O26873 Cervical shortening, third trimester: Secondary | ICD-10-CM | POA: Diagnosis not present

## 2021-02-15 DIAGNOSIS — O0990 Supervision of high risk pregnancy, unspecified, unspecified trimester: Secondary | ICD-10-CM

## 2021-02-15 NOTE — Discharge Instructions (Signed)
Please keep your next scheduled appointment.  If you have questions or concerns you may call the on call provider.  If you have urgent concerns please go to the nearest emergency department for evaluation. 

## 2021-02-15 NOTE — Discharge Summary (Signed)
See Final Progress Note 

## 2021-02-15 NOTE — Final Progress Note (Signed)
Physician Final Progress Note  Patient ID: Autumn Davies MRN: [id number] DOB/AGE: [birth year] 28 y.o.  Admit date: 02/15/2021 Admitting provider: Conard Novak, MD Discharge date: 02/15/2021  Admission Diagnoses:  1) intrauterine pregnancy at [redacted]w[redacted]d  2) vaginal bleeding in pregnancy, third trimester  Discharge Diagnoses:  1) intrauterine pregnancy at [redacted]w[redacted]d  2) vaginal bleeding in pregnancy, third trimester  History of Present Illness: The patient is a 30 y.o. female [phone] at [redacted]w[redacted]d who presents for vaginal bleeding that onset today at about 130pm.  Her pregnancy has been complicated by a short cervix (40mm), diagnosed mid second trimester. She has been taking vaginal progesterone daily and states that she continues to do so. She has been followed by MFM with serial cervical ultrasounds and recently these were discontinued due to gestational age and stability of cervix.  Her pregnancy is also complicated by the history of a LEEP procedure 02/2020 for CIN III.  Further, her pregnancy is complicated by a history of severe preeclampsia with induction of labor at 36 weeks. She reports having intercourse this morning. She then noted this afternoon a pink-tinge to her discharge.  She denies bright red, frank and heavy bleeding. She denies leakage of fluid. She notes +FM. She denies contractions.  She denies headaches, visual changes, and RUQ pain.  She denies vaginal irritative symptoms.   Past Medical History:  Diagnosis Date   Anxiety    Charcot-Marie disease    AFFECTS MOSTLY HER LEGS-CAUSES CRAMPS AND RESTLESS LEG-TAKES GABAPENTIN AT NIGHT WHICH SEEMS TO HELP   Endometriosis determined by laparoscopy    Migraines     Past Surgical History:  Procedure Laterality Date   LAPAROSCOPY N/A 12/25/2015   Procedure: LAPAROSCOPY DIAGNOSTIC;  Surgeon: Conard Novak, MD;  Location: [facility] ORS;  Service: Gynecology;  Laterality: N/A;   LEEP N/A 03/18/2020   Procedure: LOOP ELECTROSURGICAL  EXCISION PROCEDURE (LEEP);  Surgeon: Conard Novak, MD;  Location: [facility] ORS;  Service: Gynecology;  Laterality: N/A;    No current facility-administered medications on file prior to encounter.   Current Outpatient Medications on File Prior to Encounter  Medication Sig Dispense Refill   Prenatal Vit-Fe Fumarate-FA (MULTIVITAMIN-PRENATAL) 27-0.8 MG TABS tablet Take 1 tablet by mouth daily at 12 noon.     progesterone (PROMETRIUM) 200 MG capsule Place 1 capsule (200 mg total) vaginally daily. 60 capsule 1   Doxylamine-Pyridoxine (DICLEGIS) 10-10 MG TBEC Take 2 tablets by mouth at bedtime. If symptoms persist, add one tablet in the morning and one in the afternoon (Patient not taking: Reported on 01/19/2021) 100 tablet 5   [DISCONTINUED] doxepin (SINEQUAN) 10 MG capsule   8   [DISCONTINUED] etonogestrel (NEXPLANON) 68 MG IMPL implant 1 each by Subdermal route once.      Allergies  Allergen Reactions   Citalopram Other (See Comments)    Headache   Sulfa Antibiotics Rash    Social History   Socioeconomic History   Marital status: Married    Spouse name: Not on file   Number of children: Not on file   Years of education: Not on file   Highest education level: Not on file  Occupational History   Not on file  Tobacco Use   Smoking status: Never   Smokeless tobacco: Never  Vaping Use   Vaping Use: Never used  Substance and Sexual Activity   Alcohol use: No   Drug use: No   Sexual activity: Yes  Other Topics Concern   Not on file  Social  History Narrative   Not on file   Social Determinants of Health   Financial Resource Strain: Not on file  Food Insecurity: Not on file  Transportation Needs: Not on file  Physical Activity: Not on file  Stress: Not on file  Social Connections: Not on file  Intimate Partner Violence: Not on file    Family History  Problem Relation Age of Onset   Charcot-Marie-Tooth disease Father    Charcot-Marie-Tooth disease Sister    Heart  disease Maternal Grandmother    Hyperlipidemia Maternal Grandmother    Heart disease Maternal Grandfather      Review of Systems  Constitutional: Negative.   HENT: Negative.    Eyes: Negative.   Respiratory: Negative.    Cardiovascular: Negative.   Gastrointestinal: Negative.   Genitourinary: Negative.        See HPI  Musculoskeletal: Negative.   Skin: Negative.   Neurological: Negative.   Psychiatric/Behavioral: Negative.      Physical Exam: BP 121/75   Pulse 88   Temp 98.4 F (36.9 C) (Oral)   Resp 16   Ht 5\' 3"  (1.6 m)   Wt 92.1 kg   LMP 07/10/2020   BMI 35.96 kg/m   Physical Exam Constitutional:      General: She is not in acute distress.    Appearance: Normal appearance. She is well-developed.  Genitourinary:     No lesions in the vagina.     Right Labia: No rash, tenderness, lesions or skin changes.    Left Labia: No tenderness, lesions, skin changes or rash.    Vaginal bleeding (scant blood in vagina) present.     No vaginal discharge, erythema, tenderness or ulceration.     No vaginal prolapse present.    No cervical discharge or friability.     Cervical exam comments: Visually closed on sterile speculum exam.  HENT:     Head: Normocephalic and atraumatic.  Eyes:     General: No scleral icterus.    Conjunctiva/sclera: Conjunctivae normal.  Cardiovascular:     Rate and Rhythm: Normal rate and regular rhythm.     Heart sounds: No murmur heard.   No friction rub. No gallop.  Pulmonary:     Effort: Pulmonary effort is normal. No respiratory distress.     Breath sounds: Normal breath sounds. No wheezing or rales.  Abdominal:     General: Bowel sounds are normal. There is no distension.     Palpations: Abdomen is soft. There is mass (gravid, NT).     Tenderness: There is no abdominal tenderness. There is no guarding or rebound.  Musculoskeletal:        General: Normal range of motion.     Cervical back: Normal range of motion and neck supple.   Neurological:     General: No focal deficit present.     Mental Status: She is alert and oriented to person, place, and time.     Cranial Nerves: No cranial nerve deficit.  Skin:    General: Skin is warm and dry.     Findings: No erythema.  Psychiatric:        Mood and Affect: Mood normal.        Behavior: Behavior normal.        Judgment: Judgment normal.   Female chaperone present for pelvic exam:   Consults: None  Significant Findings/ Diagnostic Studies: none  Procedures:  NST: Baseline FHR: 135 beats/min Variability: moderate Accelerations: present Decelerations: absent Tocometry: quiet  Interpretation:  INDICATIONS: rule out uterine contractions RESULTS:  A NST procedure was performed with FHR monitoring and a normal baseline established, appropriate time of 20-40 minutes of evaluation, and accels >2 seen w 15x15 characteristics.  Results show a REACTIVE NST.    Hospital Course: The patient was admitted to Labor and Delivery Triage for observation. She had an initially elevated blood pressure. The rest of her blood pressures were entirely normal and she had no symptoms of gestational hypertension.  She otherwise had normal vital signs.  She had a reactive fetal tracing. She had no evidence of dilation on pelvic exam.  I have examined her recently in clinic and note no appreciable changes.  She did have some blood in the vaginal vault. This was scant and was likely from her cervix.  The blood was redish-brown.  She is status post 2 doses of BMTZ (a single course). I discussed with her the possible etiologies of her bleeding.  These could include intercourse she had this morning, dilation of the cervix, placental abruption, among others.  She had no abdominal tenderness nor significant bleeding  to suggest abruption. Her cervix appeared unchanged compared to her last visit in clinic.  The most likely etiology to her bleeding was from intercourse this morning.  She was offered to  stay longer and have another examination versus going home and returning if bleeding increases or continues as bright red, she feels more pelvic pressure, starts having contractions, or has any other symptoms of concern.  The fetus is notably frank breech.  This was noted on her ultrasound last week with MFM, as well. We discussed that at this gestational age that if she labored, we would try to give a rescue dose of steroids, if possible, and would try to have her on magnesium sulfate for fetal neuroprotection.  She voiced understanding of the above.  At 32 weeks and beyond, we'd still try to give a rescue dose of steroids, but she would not receive magnesium. At 34 weeks and beyond, she would not receive steroids.  She has follow up with me in 5 days.  She also understands to come back to L&D for any of the above.    Discharge Condition: stable  Disposition: Discharge disposition: 01-Home or Self Care       Diet: Regular diet  Discharge Activity: Activity as tolerated, pelvic rest is advised though not stringently required.    Allergies as of 02/15/2021       Reactions   Citalopram Other (See Comments)   Headache   Sulfa Antibiotics Rash        Medication List     STOP taking these medications    Doxylamine-Pyridoxine 10-10 MG Tbec Commonly known as: Diclegis       TAKE these medications    multivitamin-prenatal 27-0.8 MG Tabs tablet Take 1 tablet by mouth daily at 12 noon.   progesterone 200 MG capsule Commonly known as: Prometrium Place 1 capsule (200 mg total) vaginally daily.        Follow-up Information     Conard Novak, MD. Go on 02/20/2021.   Specialty: Obstetrics and Gynecology Why: Previously scheduled appointment Contact information: [address] [place] [address] [phone]                 Total time spent taking care of this patient: 34 minutes  Signed: Thomasene Mohair, MD  02/15/2021, 4:44 PM

## 2021-02-20 ENCOUNTER — Ambulatory Visit (INDEPENDENT_AMBULATORY_CARE_PROVIDER_SITE_OTHER): Payer: BC Managed Care – PPO | Admitting: Obstetrics and Gynecology

## 2021-02-20 ENCOUNTER — Other Ambulatory Visit: Payer: Self-pay

## 2021-02-20 ENCOUNTER — Encounter: Payer: Self-pay | Admitting: Obstetrics and Gynecology

## 2021-02-20 VITALS — BP 110/80 | Wt 205.0 lb

## 2021-02-20 DIAGNOSIS — O0993 Supervision of high risk pregnancy, unspecified, third trimester: Secondary | ICD-10-CM

## 2021-02-20 DIAGNOSIS — O26872 Cervical shortening, second trimester: Secondary | ICD-10-CM

## 2021-02-20 DIAGNOSIS — O99213 Obesity complicating pregnancy, third trimester: Secondary | ICD-10-CM

## 2021-02-20 DIAGNOSIS — Z3A32 32 weeks gestation of pregnancy: Secondary | ICD-10-CM

## 2021-02-20 DIAGNOSIS — Z9889 Other specified postprocedural states: Secondary | ICD-10-CM

## 2021-02-20 LAB — POCT URINALYSIS DIPSTICK OB
Glucose, UA: NEGATIVE
POC,PROTEIN,UA: NEGATIVE

## 2021-02-20 NOTE — Progress Notes (Signed)
Routine Prenatal Care Visit  Subjective  Autumn Davies is a 30 y.o. [phone] at [redacted]w[redacted]d being seen today for ongoing prenatal care.  She is currently monitored for the following issues for this high-risk pregnancy and has Pelvic pain in female; Endometriosis determined by laparoscopy; CMT (Charcot-Marie-Tooth disease); Menometrorrhagia; CIN III (cervical intraepithelial neoplasia grade III) with severe dysplasia; S/P LEEP; Supervision of high risk pregnancy, antepartum; Obesity affecting pregnancy; Short cervical length during pregnancy in second trimester; Vaginal bleeding in pregnancy, third trimester; and [redacted] weeks gestation of pregnancy on their problem list.  ----------------------------------------------------------------------------------- Patient reports no complaints.   Contractions: Irritability. Vag. Bleeding: None.  Movement: Present. Leaking Fluid denies.  ----------------------------------------------------------------------------------- The following portions of the patient's history were reviewed and updated as appropriate: allergies, current medications, past family history, past medical history, past social history, past surgical history and problem list. Problem list updated.  Objective  Blood pressure 110/80, weight 205 lb (93 kg), last menstrual period 07/10/2020. Pregravid weight 194 lb (88 kg) Total Weight Gain 11 lb (4.99 kg) Urinalysis: Urine Protein    Urine Glucose    Fetal Status: Fetal Heart Rate (bpm): 130 Fundal Height: 31 cm Movement: Present     General:  Alert, oriented and cooperative. Patient is in no acute distress.  Skin: Skin is warm and dry. No rash noted.   Cardiovascular: Normal heart rate noted  Respiratory: Normal respiratory effort, no problems with respiration noted  Abdomen: Soft, gravid, appropriate for gestational age. Pain/Pressure: Present     Pelvic:  Cervical exam deferred        Extremities: Normal range of motion.  Edema: None  Mental  Status: Normal mood and affect. Normal behavior. Normal judgment and thought content.   Assessment   30 y.o. [id number] at [redacted]w[redacted]d by  04/16/2021, by Last Menstrual Period presenting for routine prenatal visit  Plan   pregnancy3 Problems (from 07/10/20 to present)     Problem Noted Resolved   Short cervical length during pregnancy in second trimester 11/26/2020 by Conard Novak, MD No   Overview Addendum 01/12/2021  4:42 PM by Conard Novak, MD    - per MFM 2.5-2.7 cm, vaginal progesterone until 36 weeks - MFM to follow cervical length Q 2 weeks until stable - 0.9 cm in early May. No further ultrasound recommended, but there is a follow up scheduled. - s/p BMTZ - vaginal progesterone weekly until 36 weeks. - drop in cervical length of 2.6 cm in early April to 9 mm in late April       Obesity affecting pregnancy 09/12/2020 by Conard Novak, MD No   Overview Addendum 09/12/2020 11:40 AM by Conard Novak, MD    [ ]  early hgba1c - has family planning mcd. So, can't get this lab.       S/P LEEP 08/25/2020 by 08/27/2020, MD No   Overview Addendum 01/12/2021  4:44 PM by 01/14/2021, MD    [x]  TV cvx length at 16 wks - see shortened cervix problem       Supervision of high risk pregnancy, antepartum 08/25/2020 by , MD No   Overview Addendum 09/12/2020 11:19 AM by Nadara Mustard, MD    Clinic [facility] Prenatal Labs  Dating L=9 Blood type: O/Positive/-- (12/27 0831)   Genetic Screen 1 Screen:    AFP:     Quad:     NIPS: Antibody:Negative (12/27 0831)  Anatomic [birth year]20[birth year]1986  Rubella: 2.13 (12/27 0831)  Varicella: immune  GTT Early:               Third trimester:  RPR: Non Reactive (12/27 0831)   Rhogam  HBsAg: Negative (12/27 0831)   TDaP vaccine                       Flu Shot: HIV: Non Reactive (12/27 0831)   Baby Food                                GBS:   Contraception  Pap: LEEP 7/21 for CIN III, due 7/22  CBB  no   CS/VBAC n/a   Support Person  Gerilyn Pilgrim, husband                 Preterm labor symptoms and general obstetric precautions including but not limited to vaginal bleeding, contractions, leaking of fluid and fetal movement were reviewed in detail with the patient. Please refer to After Visit Summary for other counseling recommendations.   Return in about 2 weeks (around 03/06/2021) for Routine Prenatal Appointment.   Thomasene Mohair, MD, [facility], [facility] 02/20/2021 12:31 PM

## 2021-02-20 NOTE — Addendum Note (Signed)
Addended by: Donnetta Hail on: 02/20/2021 02:45 PM   Modules accepted: Orders

## 2021-02-28 ENCOUNTER — Ambulatory Visit: Admit: 2021-02-28 | Discharge status: Against Medical Advice | Payer: BC Managed Care – PPO

## 2021-03-06 ENCOUNTER — Encounter: Payer: Self-pay | Admitting: Obstetrics and Gynecology

## 2021-03-06 ENCOUNTER — Ambulatory Visit (INDEPENDENT_AMBULATORY_CARE_PROVIDER_SITE_OTHER): Payer: BC Managed Care – PPO | Admitting: Obstetrics and Gynecology

## 2021-03-06 ENCOUNTER — Other Ambulatory Visit: Payer: Self-pay

## 2021-03-06 VITALS — BP 110/72 | Wt 210.0 lb

## 2021-03-06 DIAGNOSIS — O26872 Cervical shortening, second trimester: Secondary | ICD-10-CM

## 2021-03-06 DIAGNOSIS — O0993 Supervision of high risk pregnancy, unspecified, third trimester: Secondary | ICD-10-CM

## 2021-03-06 DIAGNOSIS — Z3A34 34 weeks gestation of pregnancy: Secondary | ICD-10-CM

## 2021-03-06 DIAGNOSIS — O4693 Antepartum hemorrhage, unspecified, third trimester: Secondary | ICD-10-CM

## 2021-03-06 DIAGNOSIS — G600 Hereditary motor and sensory neuropathy: Secondary | ICD-10-CM

## 2021-03-06 MED ORDER — FLUCONAZOLE 150 MG PO TABS: 150.0000 mg | ORAL_TABLET | Freq: Once | ORAL | 0 refills | Status: AC

## 2021-03-06 NOTE — Progress Notes (Signed)
Routine Prenatal Care Visit  Subjective  Santia Labate Bruna is a 30 y.o. [phone] at [redacted]w[redacted]d being seen today for ongoing prenatal care.  She is currently monitored for the following issues for this high-risk pregnancy and has Pelvic pain in female; Endometriosis determined by laparoscopy; CMT (Charcot-Marie-Tooth disease); Menometrorrhagia; CIN III (cervical intraepithelial neoplasia grade III) with severe dysplasia; S/P LEEP; Supervision of high risk pregnancy, antepartum; Obesity affecting pregnancy; Short cervical length during pregnancy in second trimester; Vaginal bleeding in pregnancy, third trimester; and [redacted] weeks gestation of pregnancy on their problem list.  ----------------------------------------------------------------------------------- Patient reports increased discharge. No vaginal irritative sx, no bladder or urinary sx. No gush of fluid   Contractions: Not present. Vag. Bleeding: None.  Movement: Present. Leaking Fluid denies.  ----------------------------------------------------------------------------------- The following portions of the patient's history were reviewed and updated as appropriate: allergies, current medications, past family history, past medical history, past social history, past surgical history and problem list. Problem list updated.  Objective  Blood pressure 110/72, weight 210 lb (95.3 kg), last menstrual period 07/10/2020. Pregravid weight 194 lb (88 kg) Total Weight Gain 16 lb (7.258 kg) Urinalysis: Urine Protein    Urine Glucose    Fetal Status: Fetal Heart Rate (bpm): 135 Fundal Height: 34 cm Movement: Present  Presentation: Complete Breech (by u/s)  General:  Alert, oriented and cooperative. Patient is in no acute distress.  Skin: Skin is warm and dry. No rash noted.   Cardiovascular: Normal heart rate noted  Respiratory: Normal respiratory effort, no problems with respiration noted  Abdomen: Soft, gravid, appropriate for gestational age.  Pain/Pressure: Present     Pelvic:  Cervical exam performed Dilation: Fingertip Effacement (%): 80 Station: -3  Extremities: Normal range of motion.  Edema: None  Mental Status: Normal mood and affect. Normal behavior. Normal judgment and thought content.   Female chaperone present for pelvic exam:   Wet Prep: PH: not done Clue Cells: Negative Fungal elements: Positive Trichomonas: Negative   Assessment   30 y.o. [id number] at [redacted]w[redacted]d by  04/16/2021, by Last Menstrual Period presenting for routine prenatal visit  Plan   pregnancy3 Problems (from 07/10/20 to present)     Problem Noted Resolved   Short cervical length during pregnancy in second trimester 11/26/2020 by Conard Novak, MD No   Overview Addendum 01/12/2021  4:42 PM by Conard Novak, MD    - per MFM 2.5-2.7 cm, vaginal progesterone until 36 weeks - MFM to follow cervical length Q 2 weeks until stable - 0.9 cm in early May. No further ultrasound recommended, but there is a follow up scheduled. - s/p BMTZ - vaginal progesterone weekly until 36 weeks. - drop in cervical length of 2.6 cm in early April to 9 mm in late April       Obesity affecting pregnancy 09/12/2020 by Conard Novak, MD No   Overview Addendum 09/12/2020 11:40 AM by Conard Novak, MD    [ ]  early hgba1c - has family planning mcd. So, can't get this lab.       S/P LEEP 08/25/2020 by 08/27/2020, MD No   Overview Addendum 01/12/2021  4:44 PM by 01/14/2021, MD    [x]  TV cvx length at 16 wks - see shortened cervix problem       Supervision of high risk pregnancy, antepartum 08/25/2020 by , MD No   Overview Addendum 09/12/2020 11:19 AM by Nadara Mustard, MD    Clinic [facility] Prenatal Labs  Dating L=9 Blood type: O/Positive/-- (12/27 0831)   Genetic Screen 1 Screen:    AFP:     Quad:     NIPS: Antibody:Negative (12/27 0831)  Anatomic [place]  Rubella: 2.13 (12/27 0831)  Varicella: immune  GTT Early:                Third trimester:  RPR: Non Reactive (12/27 0831)   Rhogam  HBsAg: Negative (12/27 0831)   TDaP vaccine                       Flu Shot: HIV: Non Reactive (12/27 0831)   Baby Food                                GBS:   Contraception  Pap: LEEP 7/21 for CIN III, due 7/22  CBB  no   CS/VBAC n/a   Support Person Gerilyn Pilgrim, husband                 Preterm labor symptoms and general obstetric precautions including but not limited to vaginal bleeding, contractions, leaking of fluid and fetal movement were reviewed in detail with the patient. Please refer to After Visit Summary for other counseling recommendations.   - discussed continued breech presentation  Return in about 2 weeks (around 03/20/2021) for Routine Prenatal Appointment.   Thomasene Mohair, MD, [facility], [facility] 03/06/2021 5:23 PM

## 2021-03-11 LAB — STREP GP B NAA: Strep Gp B NAA: NEGATIVE

## 2021-03-12 ENCOUNTER — Encounter: Payer: Self-pay | Admitting: Obstetrics and Gynecology

## 2021-03-12 ENCOUNTER — Other Ambulatory Visit: Payer: Self-pay

## 2021-03-12 ENCOUNTER — Observation Stay
Admission: EM | Admit: 2021-03-12 | Discharge: 2021-03-12 | Disposition: A | Discharge status: Home / Self Care | Payer: BC Managed Care – PPO | Attending: Obstetrics and Gynecology | Admitting: Obstetrics and Gynecology

## 2021-03-12 DIAGNOSIS — R519 Headache, unspecified: Secondary | ICD-10-CM | POA: Diagnosis present

## 2021-03-12 DIAGNOSIS — O99891 Other specified diseases and conditions complicating pregnancy: Secondary | ICD-10-CM | POA: Diagnosis not present

## 2021-03-12 DIAGNOSIS — O09293 Supervision of pregnancy with other poor reproductive or obstetric history, third trimester: Secondary | ICD-10-CM

## 2021-03-12 DIAGNOSIS — M7989 Other specified soft tissue disorders: Secondary | ICD-10-CM | POA: Diagnosis not present

## 2021-03-12 DIAGNOSIS — Z3A35 35 weeks gestation of pregnancy: Secondary | ICD-10-CM | POA: Insufficient documentation

## 2021-03-12 DIAGNOSIS — O0990 Supervision of high risk pregnancy, unspecified, unspecified trimester: Secondary | ICD-10-CM

## 2021-03-12 DIAGNOSIS — Z9889 Other specified postprocedural states: Secondary | ICD-10-CM

## 2021-03-12 DIAGNOSIS — O99353 Diseases of the nervous system complicating pregnancy, third trimester: Principal | ICD-10-CM | POA: Insufficient documentation

## 2021-03-12 DIAGNOSIS — O26893 Other specified pregnancy related conditions, third trimester: Secondary | ICD-10-CM | POA: Diagnosis present

## 2021-03-12 DIAGNOSIS — O26872 Cervical shortening, second trimester: Secondary | ICD-10-CM

## 2021-03-12 LAB — CBC WITH DIFFERENTIAL/PLATELET
Abs Immature Granulocytes: 0.07 10*3/uL (ref 0.00–0.07)
Basophils Absolute: 0.1 10*3/uL (ref 0.0–0.1)
Basophils Relative: 1 %
Eosinophils Absolute: 0.1 10*3/uL (ref 0.0–0.5)
Eosinophils Relative: 1 %
HCT: 36.3 % (ref 36.0–46.0)
Hemoglobin: 12.2 g/dL (ref 12.0–15.0)
Immature Granulocytes: 1 %
Lymphocytes Relative: 17 %
Lymphs Abs: 2.3 10*3/uL (ref 0.7–4.0)
MCH: 30.2 pg (ref 26.0–34.0)
MCHC: 33.6 g/dL (ref 30.0–36.0)
MCV: 89.9 fL (ref 80.0–100.0)
Monocytes Absolute: 1.3 10*3/uL — ABNORMAL HIGH (ref 0.1–1.0)
Monocytes Relative: 10 %
Neutro Abs: 9.7 10*3/uL — ABNORMAL HIGH (ref 1.7–7.7)
Neutrophils Relative %: 70 %
Platelets: 224 10*3/uL (ref 150–400)
RBC: 4.04 MIL/uL (ref 3.87–5.11)
RDW: 13.9 % (ref 11.5–15.5)
WBC: 13.5 10*3/uL — ABNORMAL HIGH (ref 4.0–10.5)
nRBC: 0 % (ref 0.0–0.2)

## 2021-03-12 LAB — COMPREHENSIVE METABOLIC PANEL
ALT: 15 U/L (ref 0–44)
AST: 16 U/L (ref 15–41)
Albumin: 2.8 g/dL — ABNORMAL LOW (ref 3.5–5.0)
Alkaline Phosphatase: 106 U/L (ref 38–126)
Anion gap: 7 (ref 5–15)
BUN: 6 mg/dL (ref 6–20)
CO2: 23 mmol/L (ref 22–32)
Calcium: 8.8 mg/dL — ABNORMAL LOW (ref 8.9–10.3)
Chloride: 107 mmol/L (ref 98–111)
Creatinine, Ser: 0.48 mg/dL (ref 0.44–1.00)
GFR, Estimated: >60 mL/min (ref >60)
Glucose, Bld: 88 mg/dL (ref 70–99)
Potassium: 4.6 mmol/L (ref 3.5–5.1)
Sodium: 137 mmol/L (ref 135–145)
Total Bilirubin: 0.8 mg/dL (ref 0.3–1.2)
Total Protein: 6.7 g/dL (ref 6.5–8.1)

## 2021-03-12 LAB — PROTEIN / CREATININE RATIO, URINE
Creatinine, Urine: 66 mg/dL
Protein Creatinine Ratio: 0.15 mg/mg{Cre} (ref 0.00–0.15)
Total Protein, Urine: 10 mg/dL

## 2021-03-12 MED ORDER — PROCHLORPERAZINE MALEATE 10 MG PO TABS: 10.0000 mg | ORAL_TABLET | Freq: Four times a day (QID) | ORAL | 0 refills | Status: DC | PRN

## 2021-03-12 NOTE — Telephone Encounter (Signed)
Spoke w/Kelly at L&D. Advised patient to report for monitoring.

## 2021-03-12 NOTE — OB Triage Note (Signed)
Patient Discharged home per MD . Pt educated about labor precautions and elevated blood pressures and informed when to return to the ED for further evaluation. Pt instructed to keep all follow up appointments with her provider. AVS given to patient and RN answered all questions and patient has no further questions at this time. Pt discharged home in stable condition with significant other.

## 2021-03-12 NOTE — OB Triage Note (Signed)
Pt is a 30 yo [id number] at 35w that presents from home with c/o mild headache and increased swelling in hands and feet since Sunday. Pt states she had preeclampsia with her first baby and wanted to be checked. Initial BP 136/83 and cycling q15 minutes. PT denies VB, LOF and states positive FM. Pt denies Epigastric pain, vision changes and states headache is 5/10 scale. Pt has history of migraines but has not taken any medication this pregnancy. EFM applied and intial FHT 150.

## 2021-03-12 NOTE — Discharge Summary (Signed)
Physician Final Progress Note  Patient ID: Autumn Davies MRN: [id number] DOB/AGE: 28/28/1992 28 y.o.  Admit date: 03/12/2021 Admitting provider: Vena Austria, MD Discharge date: 03/12/2021   Admission Diagnoses: Headache third trimester  Discharge Diagnoses:  Active Problems:   Headache in pregnancy, third trimester  30 y.o. [id number] at [redacted]w[redacted]d by Estimated Date of Delivery: 04/16/21 presenting with headache and lower extremity swelling in the setting of preeclampsia with G1 pregnancy.  She has not been on ASA this pregnancy.  She reports a history of migraines outside of pregnancy.  No vision changes.  +FM, no LOF, no VB, no contractions.  BP on admission normotensive. Laboratory evaluations normal.  Discussed headaches in pregnancy given an Rx for compazine.  Blood pressure 123/76, pulse 86, temperature 98.3 F (36.8 C), temperature source Oral, resp. rate 16, height 5\' 3"  (1.6 m), weight 95.3 kg, last menstrual period 07/10/2020.  pregnancy3 Problems (from 07/10/20 to present)     Problem Noted Resolved   Short cervical length during pregnancy in second trimester 11/26/2020 by 11/28/2020, MD No   Overview Addendum 01/12/2021  4:42 PM by 01/14/2021, MD    - per MFM 2.5-2.7 cm, vaginal progesterone until 36 weeks - MFM to follow cervical length Q 2 weeks until stable - 0.9 cm in early May. No further ultrasound recommended, but there is a follow up scheduled. - s/p BMTZ - vaginal progesterone weekly until 36 weeks. - drop in cervical length of 2.6 cm in early April to 9 mm in late April       Obesity affecting pregnancy 09/12/2020 by 09/14/2020, MD No   Overview Addendum 09/12/2020 11:40 AM by 09/14/2020, MD    [ ]  early hgba1c - has family planning mcd. So, can't get this lab.       S/P LEEP 08/25/2020 by , MD No   Overview Addendum 01/12/2021  4:44 PM by Nadara Mustard, MD    [x]  TV cvx length at 16 wks - see shortened  cervix problem       Supervision of high risk pregnancy, antepartum 08/25/2020 by Conard Novak, MD No   Overview Addendum 09/12/2020 11:19 AM by 08/27/2020, MD    Clinic [facility] Prenatal Labs  Dating L=9 Blood type: O/Positive/-- (12/27 0831)   Genetic Screen 1 Screen:    AFP:     Quad:     NIPS: Antibody:Negative (12/27 0831)  Anatomic Conard Novak normal Rubella: 2.13 (12/27 0831)  Varicella: immune  GTT trimester: 100 RPR: Non Reactive (12/27 0831)   Rhogam  HBsAg: Negative (12/27 0831)   TDaP vaccine                       Flu Shot: HIV: Non Reactive (12/27 0831)   Baby Food                                GBS:   Contraception  Pap: LEEP 7/21 for CIN III, due 7/22  CBB  no   CS/VBAC n/a   Support Person 04-20-1984, husband                Consults: None  Significant Findings/ Diagnostic Studies:  Results for orders placed or performed during the hospital encounter of 03/12/21 (from the past 24 hour(s))  Protein / creatinine ratio, urine     Status: None  Collection Time: 03/12/21  3:14 PM  Result Value Ref Range   Creatinine, Urine 66 mg/dL   Total Protein, Urine 10 mg/dL   Protein Creatinine Ratio 0.15 0.00 - 0.15 mg/mg[Cre]  CBC with Differential/Platelet     Status: Abnormal   Collection Time: 03/12/21  3:39 PM  Result Value Ref Range   WBC 13.5 (H) 4.0 - 10.5 K/uL   RBC 4.04 3.87 - 5.11 MIL/uL   Hemoglobin 12.2 12.0 - 15.0 g/dL   HCT 35.5 97.4 - 16.3 %   MCV 89.9 80.0 - 100.0 fL   MCH 30.2 26.0 - 34.0 pg   MCHC 33.6 30.0 - 36.0 g/dL   RDW 84.5 36.4 - 68.0 %   Platelets 224 150 - 400 K/uL   nRBC 0.0 0.0 - 0.2 %   Neutrophils Relative % 70 %   Neutro Abs 9.7 (H) 1.7 - 7.7 K/uL   Lymphocytes Relative 17 %   Lymphs Abs 2.3 0.7 - 4.0 K/uL   Monocytes Relative 10 %   Monocytes Absolute 1.3 (H) 0.1 - 1.0 K/uL   Eosinophils Relative 1 %   Eosinophils Absolute 0.1 0.0 - 0.5 K/uL   Basophils Relative 1 %   Basophils Absolute 0.1 0.0 - 0.1 K/uL   Immature  Granulocytes 1 %   Abs Immature Granulocytes 0.07 0.00 - 0.07 K/uL  Comprehensive metabolic panel     Status: Abnormal   Collection Time: 03/12/21  3:39 PM  Result Value Ref Range   Sodium 137 135 - 145 mmol/L   Potassium 4.6 3.5 - 5.1 mmol/L   Chloride 107 98 - 111 mmol/L   CO2 23 22 - 32 mmol/L   Glucose, Bld 88 70 - 99 mg/dL   BUN 6 6 - 20 mg/dL   Creatinine, Ser 3.21 0.44 - 1.00 mg/dL   Calcium 8.8 (L) 8.9 - 10.3 mg/dL   Total Protein 6.7 6.5 - 8.1 g/dL   Albumin 2.8 (L) 3.5 - 5.0 g/dL   AST 16 15 - 41 U/L   ALT 15 0 - 44 U/L   Alkaline Phosphatase 106 38 - 126 U/L   Total Bilirubin 0.8 0.3 - 1.2 mg/dL   GFR, Estimated >22 >48 mL/min   Anion gap 7 5 - 15     Procedures: NST Baseline: 150 Variability: moderate Accelerations: present Decelerations: absent Tocometry: none The patient was monitored for 30 minutes, fetal heart rate tracing was deemed reactive, category I tracing,  CPT [id number]   Discharge Condition: good  Disposition: Discharge disposition: 01-Home or Self Care       Diet: Regular diet  Discharge Activity: Activity as tolerated  Discharge Instructions     Discharge activity:  No Restrictions   Complete by: As directed    Discharge diet:  No restrictions   Complete by: As directed    Fetal Kick Count:  Lie on our left side for one hour after a meal, and count the number of times your baby kicks.  If it is less than 5 times, get up, move around and drink some juice.  Repeat the test 30 minutes later.  If it is still less than 5 kicks in an hour, notify your doctor.   Complete by: As directed    LABOR:  When conractions begin, you should start to time them from the beginning of one contraction to the beginning  of the next.  When contractions are 5 - 10 minutes apart or less and have been regular for  at least an hour, you should call your health care provider.   Complete by: As directed    No sexual activity restrictions   Complete by: As directed     Notify physician for bleeding from the vagina   Complete by: As directed    Notify physician for blurring of vision or spots before the eyes   Complete by: As directed    Notify physician for chills or fever   Complete by: As directed    Notify physician for fainting spells, "black outs" or loss of consciousness   Complete by: As directed    Notify physician for increase in vaginal discharge   Complete by: As directed    Notify physician for leaking of fluid   Complete by: As directed    Notify physician for pain or burning when urinating   Complete by: As directed    Notify physician for pelvic pressure (sudden increase)   Complete by: As directed    Notify physician for severe or continued nausea or vomiting   Complete by: As directed    Notify physician for sudden gushing of fluid from the vagina (with or without continued leaking)   Complete by: As directed    Notify physician for sudden, constant, or occasional abdominal pain   Complete by: As directed    Notify physician if baby moving less than usual   Complete by: As directed       Allergies as of 03/12/2021       Reactions   Citalopram Other (See Comments)   Headache   Sulfa Antibiotics Rash        Medication List     TAKE these medications    gabapentin 300 MG capsule Commonly known as: NEURONTIN Take 1 capsule by mouth 3 (three) times daily.   multivitamin-prenatal 27-0.8 MG Tabs tablet Take 1 tablet by mouth daily at 12 noon.   prochlorperazine 10 MG tablet Commonly known as: COMPAZINE Take 1 tablet (10 mg total) by mouth every 6 (six) hours as needed for nausea (headache).   progesterone 200 MG capsule Commonly known as: Prometrium Place 1 capsule (200 mg total) vaginally daily.         Total time spent taking care of this patient: 30 minutes  Signed: Vena Austria 03/12/2021, 5:18 PM

## 2021-03-16 ENCOUNTER — Other Ambulatory Visit: Payer: Self-pay | Admitting: Obstetrics and Gynecology

## 2021-03-20 ENCOUNTER — Encounter: Payer: Self-pay | Admitting: Advanced Practice Midwife

## 2021-03-20 ENCOUNTER — Ambulatory Visit (INDEPENDENT_AMBULATORY_CARE_PROVIDER_SITE_OTHER): Payer: BC Managed Care – PPO | Admitting: Advanced Practice Midwife

## 2021-03-20 ENCOUNTER — Other Ambulatory Visit: Payer: Self-pay

## 2021-03-20 VITALS — BP 118/74 | Wt 214.8 lb

## 2021-03-20 DIAGNOSIS — O0990 Supervision of high risk pregnancy, unspecified, unspecified trimester: Secondary | ICD-10-CM

## 2021-03-20 DIAGNOSIS — O26872 Cervical shortening, second trimester: Secondary | ICD-10-CM

## 2021-03-20 DIAGNOSIS — Z3685 Encounter for antenatal screening for Streptococcus B: Secondary | ICD-10-CM

## 2021-03-20 DIAGNOSIS — Z369 Encounter for antenatal screening, unspecified: Secondary | ICD-10-CM

## 2021-03-20 DIAGNOSIS — O99213 Obesity complicating pregnancy, third trimester: Secondary | ICD-10-CM

## 2021-03-20 DIAGNOSIS — Z3A36 36 weeks gestation of pregnancy: Secondary | ICD-10-CM

## 2021-03-20 DIAGNOSIS — Z113 Encounter for screening for infections with a predominantly sexual mode of transmission: Secondary | ICD-10-CM

## 2021-03-20 LAB — POCT URINALYSIS DIPSTICK OB
Glucose, UA: NEGATIVE
POC,PROTEIN,UA: NEGATIVE

## 2021-03-20 NOTE — Progress Notes (Signed)
Routine Prenatal Care Visit  Subjective  Autumn Davies is a 30 y.o. [phone] at [redacted]w[redacted]d being seen today for ongoing prenatal care.  She is currently monitored for the following issues for this high-risk pregnancy and has Pelvic pain in female; Endometriosis determined by laparoscopy; CMT (Charcot-Marie-Tooth disease); Menometrorrhagia; CIN III (cervical intraepithelial neoplasia grade III) with severe dysplasia; S/P LEEP; Supervision of high risk pregnancy, antepartum; Obesity affecting pregnancy; Short cervical length during pregnancy in second trimester; Vaginal bleeding in pregnancy, third trimester; Headache in pregnancy, third trimester; Chronic insomnia; Irritable bowel syndrome with both constipation and diarrhea; and Headache disorder on their problem list.  ----------------------------------------------------------------------------------- Patient reports baby may have turned since last visit. She is feeling more kicks in upper quadrant.   Contractions: Not present. Vag. Bleeding: None.  Movement: Present. Leaking Fluid denies.  ----------------------------------------------------------------------------------- The following portions of the patient's history were reviewed and updated as appropriate: allergies, current medications, past family history, past medical history, past social history, past surgical history and problem list. Problem list updated.  Objective  Blood pressure 118/74, weight 214 lb 12.8 oz (97.4 kg), last menstrual period 07/10/2020. Pregravid weight 194 lb (88 kg) Total Weight Gain 20 lb 12.8 oz (9.435 kg) Urinalysis: Urine Protein Negative  Urine Glucose Negative  Fetal Status: Fetal Heart Rate (bpm): 154 Fundal Height: 36 cm Movement: Present  Presentation: Vertex  General:  Alert, oriented and cooperative. Patient is in no acute distress.  Skin: Skin is warm and dry. No rash noted.   Cardiovascular: Normal heart rate noted  Respiratory: Normal respiratory  effort, no problems with respiration noted  Abdomen: Soft, gravid, appropriate for gestational age. Pain/Pressure: Present     Pelvic:  Cervical exam performed Dilation: 1 Effacement (%): 90 Station: -3, vertex on exam  Extremities: Normal range of motion.  Edema: None  Mental Status: Normal mood and affect. Normal behavior. Normal judgment and thought content.   Assessment   30 y.o. [id number] at [redacted]w[redacted]d by  04/16/2021, by Last Menstrual Period presenting for routine prenatal visit  Plan   pregnancy3 Problems (from 07/10/20 to present)    Problem Noted Resolved   Short cervical length during pregnancy in second trimester 11/26/2020 by Conard Novak, MD No   Overview Addendum 01/12/2021  4:42 PM by Conard Novak, MD    - per MFM 2.5-2.7 cm, vaginal progesterone until 36 weeks - MFM to follow cervical length Q 2 weeks until stable - 0.9 cm in early May. No further ultrasound recommended, but there is a follow up scheduled. - s/p BMTZ - vaginal progesterone weekly until 36 weeks. - drop in cervical length of 2.6 cm in early April to 9 mm in late April      Obesity affecting pregnancy 09/12/2020 by Conard Novak, MD No   Overview Addendum 09/12/2020 11:40 AM by Conard Novak, MD    [ ]  early hgba1c - has family planning mcd. So, can't get this lab.      S/P LEEP 08/25/2020 by 08/27/2020, MD No   Overview Addendum 01/12/2021  4:44 PM by 01/14/2021, MD    [x]  TV cvx length at 16 wks - see shortened cervix problem      Supervision of high risk pregnancy, antepartum 08/25/2020 by , MD No   Overview Addendum 09/12/2020 11:19 AM by Nadara Mustard, MD    [facility]inic [facility] Prenatal Labs  Dating L=9 Blood type: O/Positive/-- (12/27 0831)   Genetic Screen 1 Screen:  AFP:     Quad:     NIPS: Antibody:Negative (12/27 0831)  Anatomic [place]  Rubella: 2.13 (12/27 0831)  Varicella: immune  GTT Early:               Third trimester:  RPR: Non Reactive  (12/27 0831)   Rhogam  HBsAg: Negative (12/27 0831)   TDaP vaccine                       Flu Shot: HIV: Non Reactive (12/27 0831)   Baby Food                                GBS:   Contraception  Pap: LEEP 7/21 for CIN III, due 7/22  CBB  no   CS/VBAC n/a   Support Person Gerilyn Pilgrim, husband               Preterm labor symptoms and general obstetric precautions including but not limited to vaginal bleeding, contractions, leaking of fluid and fetal movement were reviewed in detail with the patient.   Return in about 1 week (around 03/27/2021) for weekly rob x3.  [name], CNM 03/20/2021 4:01 PM

## 2021-03-22 ENCOUNTER — Other Ambulatory Visit: Payer: Self-pay | Admitting: Obstetrics and Gynecology

## 2021-03-25 ENCOUNTER — Encounter
Admission: RE | Disposition: A | Discharge status: Home / Self Care | Payer: Self-pay | Source: Home / Self Care | Attending: Obstetrics & Gynecology

## 2021-03-25 ENCOUNTER — Inpatient Hospital Stay: Payer: BLUE CROSS/BLUE SHIELD | Admitting: Anesthesiology

## 2021-03-25 ENCOUNTER — Encounter: Payer: Self-pay | Admitting: Advanced Practice Midwife

## 2021-03-25 ENCOUNTER — Ambulatory Visit (INDEPENDENT_AMBULATORY_CARE_PROVIDER_SITE_OTHER): Payer: BC Managed Care – PPO | Admitting: Advanced Practice Midwife

## 2021-03-25 ENCOUNTER — Inpatient Hospital Stay
Admission: RE | Admit: 2021-03-25 | Discharge: 2021-03-27 | DRG: 787 | Disposition: A | Discharge status: Home / Self Care | Payer: BLUE CROSS/BLUE SHIELD | Attending: Obstetrics & Gynecology | Admitting: Obstetrics & Gynecology

## 2021-03-25 ENCOUNTER — Encounter: Payer: Self-pay | Admitting: Obstetrics & Gynecology

## 2021-03-25 ENCOUNTER — Other Ambulatory Visit: Payer: Self-pay

## 2021-03-25 VITALS — BP 151/105 | Wt 214.0 lb

## 2021-03-25 DIAGNOSIS — O1490 Unspecified pre-eclampsia, unspecified trimester: Secondary | ICD-10-CM | POA: Diagnosis present

## 2021-03-25 DIAGNOSIS — O9081 Anemia of the puerperium: Secondary | ICD-10-CM | POA: Diagnosis not present

## 2021-03-25 DIAGNOSIS — D62 Acute posthemorrhagic anemia: Secondary | ICD-10-CM | POA: Diagnosis not present

## 2021-03-25 DIAGNOSIS — O26872 Cervical shortening, second trimester: Secondary | ICD-10-CM

## 2021-03-25 DIAGNOSIS — O163 Unspecified maternal hypertension, third trimester: Secondary | ICD-10-CM | POA: Insufficient documentation

## 2021-03-25 DIAGNOSIS — Z3A37 37 weeks gestation of pregnancy: Secondary | ICD-10-CM

## 2021-03-25 DIAGNOSIS — O99214 Obesity complicating childbirth: Secondary | ICD-10-CM | POA: Diagnosis present

## 2021-03-25 DIAGNOSIS — O1404 Mild to moderate pre-eclampsia, complicating childbirth: Secondary | ICD-10-CM | POA: Diagnosis present

## 2021-03-25 DIAGNOSIS — O321XX0 Maternal care for breech presentation, not applicable or unspecified: Secondary | ICD-10-CM | POA: Diagnosis present

## 2021-03-25 DIAGNOSIS — R030 Elevated blood-pressure reading, without diagnosis of hypertension: Secondary | ICD-10-CM | POA: Diagnosis present

## 2021-03-25 DIAGNOSIS — Z98891 History of uterine scar from previous surgery: Secondary | ICD-10-CM

## 2021-03-25 DIAGNOSIS — Z9889 Other specified postprocedural states: Secondary | ICD-10-CM

## 2021-03-25 DIAGNOSIS — R519 Headache, unspecified: Secondary | ICD-10-CM | POA: Diagnosis present

## 2021-03-25 DIAGNOSIS — O0990 Supervision of high risk pregnancy, unspecified, unspecified trimester: Secondary | ICD-10-CM

## 2021-03-25 DIAGNOSIS — Z3A36 36 weeks gestation of pregnancy: Secondary | ICD-10-CM

## 2021-03-25 DIAGNOSIS — O1494 Unspecified pre-eclampsia, complicating childbirth: Secondary | ICD-10-CM

## 2021-03-25 DIAGNOSIS — Z20822 Contact with and (suspected) exposure to covid-19: Secondary | ICD-10-CM | POA: Diagnosis present

## 2021-03-25 DIAGNOSIS — O99213 Obesity complicating pregnancy, third trimester: Secondary | ICD-10-CM

## 2021-03-25 LAB — RESP PANEL BY RT-PCR (FLU A&B, COVID) ARPGX2
Influenza A by PCR: NEGATIVE
Influenza B by PCR: NEGATIVE
SARS Coronavirus 2 by RT PCR: NEGATIVE

## 2021-03-25 LAB — CBC
HCT: 39.8 % (ref 36.0–46.0)
Hemoglobin: 13.5 g/dL (ref 12.0–15.0)
MCH: 30.5 pg (ref 26.0–34.0)
MCHC: 33.9 g/dL (ref 30.0–36.0)
MCV: 89.8 fL (ref 80.0–100.0)
Platelets: 218 10*3/uL (ref 150–400)
RBC: 4.43 MIL/uL (ref 3.87–5.11)
RDW: 14.5 % (ref 11.5–15.5)
WBC: 11.9 10*3/uL — ABNORMAL HIGH (ref 4.0–10.5)
nRBC: 0 % (ref 0.0–0.2)

## 2021-03-25 LAB — POCT URINALYSIS DIPSTICK OB: Glucose, UA: NEGATIVE

## 2021-03-25 LAB — TYPE AND SCREEN
ABO/RH(D): O POS
Antibody Screen: NEGATIVE

## 2021-03-25 LAB — COMPREHENSIVE METABOLIC PANEL
ALT: 13 U/L (ref 0–44)
AST: 15 U/L (ref 15–41)
Albumin: 2.9 g/dL — ABNORMAL LOW (ref 3.5–5.0)
Alkaline Phosphatase: 109 U/L (ref 38–126)
Anion gap: 6 (ref 5–15)
BUN: 8 mg/dL (ref 6–20)
CO2: 23 mmol/L (ref 22–32)
Calcium: 9.1 mg/dL (ref 8.9–10.3)
Chloride: 107 mmol/L (ref 98–111)
Creatinine, Ser: 0.6 mg/dL (ref 0.44–1.00)
GFR, Estimated: >60 mL/min (ref >60)
Glucose, Bld: 101 mg/dL — ABNORMAL HIGH (ref 70–99)
Potassium: 4.4 mmol/L (ref 3.5–5.1)
Sodium: 136 mmol/L (ref 135–145)
Total Bilirubin: 0.9 mg/dL (ref 0.3–1.2)
Total Protein: 6.7 g/dL (ref 6.5–8.1)

## 2021-03-25 LAB — PROTEIN / CREATININE RATIO, URINE
Creatinine, Urine: 222 mg/dL
Protein Creatinine Ratio: 0.31 mg/mg{Cre} — ABNORMAL HIGH (ref 0.00–0.15)
Total Protein, Urine: 69 mg/dL

## 2021-03-25 SURGERY — Surgical Case
Anesthesia: Spinal

## 2021-03-25 MED ORDER — FENTANYL CITRATE (PF) 100 MCG/2ML IJ SOLN
INTRAMUSCULAR | Status: DC | PRN
  Administered 2021-03-25: 15 ug via INTRAVENOUS

## 2021-03-25 MED ORDER — OXYTOCIN-SODIUM CHLORIDE 30-0.9 UT/500ML-% IV SOLN
2.5000 [IU]/h | INTRAVENOUS | Status: DC
  Administered 2021-03-25: 600 [IU]/h via INTRAVENOUS

## 2021-03-25 MED ORDER — ACETAMINOPHEN 500 MG PO TABS
1000.0000 mg | ORAL_TABLET | Freq: Four times a day (QID) | ORAL | Status: DC | PRN
  Administered 2021-03-25: 1000 mg via ORAL
  Filled 2021-03-25: qty 2

## 2021-03-25 MED ORDER — TERBUTALINE SULFATE 1 MG/ML IJ SOLN
INTRAMUSCULAR | Status: AC
  Filled 2021-03-25: qty 1

## 2021-03-25 MED ORDER — SOD CITRATE-CITRIC ACID 500-334 MG/5ML PO SOLN: 30.0000 mL | ORAL | Status: DC

## 2021-03-25 MED ORDER — FENTANYL CITRATE (PF) 100 MCG/2ML IJ SOLN
INTRAMUSCULAR | Status: AC
  Filled 2021-03-25: qty 2

## 2021-03-25 MED ORDER — BUPIVACAINE 0.25 % ON-Q PUMP DUAL CATH 400 ML
400.0000 mL | INJECTION | Status: DC
  Filled 2021-03-25: qty 400

## 2021-03-25 MED ORDER — AMMONIA AROMATIC IN INHA
RESPIRATORY_TRACT | Status: AC
  Filled 2021-03-25: qty 10

## 2021-03-25 MED ORDER — MORPHINE SULFATE (PF) 0.5 MG/ML IJ SOLN
INTRAMUSCULAR | Status: AC
  Filled 2021-03-25: qty 10

## 2021-03-25 MED ORDER — CEFAZOLIN SODIUM-DEXTROSE 2-4 GM/100ML-% IV SOLN
INTRAVENOUS | Status: AC
  Filled 2021-03-25: qty 100

## 2021-03-25 MED ORDER — LACTATED RINGERS IV SOLN: 500.0000 mL | INTRAVENOUS | Status: DC | PRN

## 2021-03-25 MED ORDER — ACETAMINOPHEN 325 MG PO TABS
650.0000 mg | ORAL_TABLET | ORAL | Status: DC | PRN
Start: 2021-03-25 — End: 2021-03-26

## 2021-03-25 MED ORDER — KETOROLAC TROMETHAMINE 30 MG/ML IJ SOLN
INTRAMUSCULAR | Status: DC | PRN
  Administered 2021-03-25: 30 mg via INTRAVENOUS

## 2021-03-25 MED ORDER — MORPHINE SULFATE (PF) 0.5 MG/ML IJ SOLN
INTRAMUSCULAR | Status: DC | PRN
  Administered 2021-03-25: .1 mg via EPIDURAL

## 2021-03-25 MED ORDER — METHYLERGONOVINE MALEATE 0.2 MG/ML IJ SOLN
INTRAMUSCULAR | Status: AC
  Filled 2021-03-25: qty 1

## 2021-03-25 MED ORDER — BUTORPHANOL TARTRATE 1 MG/ML IJ SOLN
1.0000 mg | INTRAMUSCULAR | Status: DC | PRN
Start: 2021-03-25 — End: 2021-03-26

## 2021-03-25 MED ORDER — BUPIVACAINE LIPOSOME 1.3 % IJ SUSP
INTRAMUSCULAR | Status: AC
  Filled 2021-03-25: qty 20

## 2021-03-25 MED ORDER — BUPIVACAINE HCL 0.5 % IJ SOLN
10.0000 mL | Freq: Once | INTRAMUSCULAR | Status: DC
  Filled 2021-03-25: qty 10

## 2021-03-25 MED ORDER — POVIDONE-IODINE 10 % EX SWAB: 2.0000 "application " | Freq: Once | CUTANEOUS | Status: DC

## 2021-03-25 MED ORDER — LIDOCAINE HCL (PF) 1 % IJ SOLN: 30.0000 mL | INTRAMUSCULAR | Status: DC | PRN

## 2021-03-25 MED ORDER — KETOROLAC TROMETHAMINE 30 MG/ML IJ SOLN
INTRAMUSCULAR | Status: AC
  Filled 2021-03-25: qty 1

## 2021-03-25 MED ORDER — CARBOPROST TROMETHAMINE 250 MCG/ML IM SOLN
INTRAMUSCULAR | Status: AC
  Filled 2021-03-25: qty 1

## 2021-03-25 MED ORDER — LIDOCAINE HCL (PF) 1 % IJ SOLN
INTRAMUSCULAR | Status: AC
  Filled 2021-03-25: qty 30

## 2021-03-25 MED ORDER — OXYTOCIN-SODIUM CHLORIDE 30-0.9 UT/500ML-% IV SOLN
INTRAVENOUS | Status: AC
  Filled 2021-03-25: qty 1000

## 2021-03-25 MED ORDER — SODIUM CHLORIDE 0.9 % IV SOLN
2.0000 g | INTRAVENOUS | Status: AC
  Administered 2021-03-25: 2 g via INTRAVENOUS

## 2021-03-25 MED ORDER — BUPIVACAINE IN DEXTROSE 0.75-8.25 % IT SOLN
INTRATHECAL | Status: DC | PRN
  Administered 2021-03-25: 1.6 mL via INTRATHECAL

## 2021-03-25 MED ORDER — ONDANSETRON HCL 4 MG/2ML IJ SOLN: 4.0000 mg | Freq: Four times a day (QID) | INTRAMUSCULAR | Status: DC | PRN

## 2021-03-25 MED ORDER — SODIUM CHLORIDE 0.9 % IV SOLN
INTRAVENOUS | Status: DC | PRN
  Administered 2021-03-25: 30 ug/min via INTRAVENOUS

## 2021-03-25 MED ORDER — OXYTOCIN BOLUS FROM INFUSION: 333.0000 mL | Freq: Once | INTRAVENOUS | Status: DC

## 2021-03-25 MED ORDER — SODIUM CHLORIDE (PF) 0.9 % IJ SOLN
INTRAMUSCULAR | Status: AC
  Filled 2021-03-25: qty 50

## 2021-03-25 MED ORDER — OXYTOCIN 10 UNIT/ML IJ SOLN
INTRAMUSCULAR | Status: AC
  Filled 2021-03-25: qty 2

## 2021-03-25 MED ORDER — TRANEXAMIC ACID-NACL 1000-0.7 MG/100ML-% IV SOLN
INTRAVENOUS | Status: AC
  Filled 2021-03-25: qty 100

## 2021-03-25 MED ORDER — SODIUM CHLORIDE 0.9 % IV SOLN
INTRAVENOUS | Status: AC
  Filled 2021-03-25: qty 2

## 2021-03-25 MED ORDER — MISOPROSTOL 200 MCG PO TABS
ORAL_TABLET | ORAL | Status: AC
  Filled 2021-03-25: qty 4

## 2021-03-25 MED ORDER — BUPIVACAINE HCL (PF) 0.5 % IJ SOLN
INTRAMUSCULAR | Status: AC
  Filled 2021-03-25: qty 30

## 2021-03-25 MED ORDER — LACTATED RINGERS IV SOLN: INTRAVENOUS | Status: DC

## 2021-03-25 MED ORDER — SOD CITRATE-CITRIC ACID 500-334 MG/5ML PO SOLN
ORAL | Status: AC
  Filled 2021-03-25: qty 15

## 2021-03-25 MED ORDER — TERBUTALINE SULFATE 1 MG/ML IJ SOLN
0.2500 mg | Freq: Once | INTRAMUSCULAR | Status: AC
  Administered 2021-03-25: 0.25 mg via SUBCUTANEOUS

## 2021-03-25 SURGICAL SUPPLY — 25 items
BACTOSHIELD CHG 4% 4OZ (MISCELLANEOUS) ×1
CATH KIT ON-Q SILVERSOAK 5IN (CATHETERS) ×4 IMPLANT
CHLORAPREP W/TINT 26 (MISCELLANEOUS) ×4 IMPLANT
DERMABOND ADVANCED (GAUZE/BANDAGES/DRESSINGS) ×2
DERMABOND ADVANCED .7 DNX12 (GAUZE/BANDAGES/DRESSINGS) ×2 IMPLANT
ELECT CAUTERY BLADE 6.4 (BLADE) ×2 IMPLANT
ELECT REM PT RETURN 9FT ADLT (ELECTROSURGICAL) ×2
ELECTRODE REM PT RTRN 9FT ADLT (ELECTROSURGICAL) ×1 IMPLANT
GLOVE SURG NEOPR MICRO LF SZ8 (GLOVE) ×2 IMPLANT
GOWN STRL REUS W/ TWL LRG LVL3 (GOWN DISPOSABLE) ×1 IMPLANT
GOWN STRL REUS W/ TWL XL LVL3 (GOWN DISPOSABLE) ×2 IMPLANT
GOWN STRL REUS W/TWL LRG LVL3 (GOWN DISPOSABLE) ×1
GOWN STRL REUS W/TWL XL LVL3 (GOWN DISPOSABLE) ×2
MANIFOLD NEPTUNE II (INSTRUMENTS) ×2 IMPLANT
MAT PREVALON FULL STRYKER (MISCELLANEOUS) ×2 IMPLANT
NS IRRIG 1000ML POUR BTL (IV SOLUTION) ×2 IMPLANT
PACK C SECTION AR (MISCELLANEOUS) ×2 IMPLANT
PAD OB MATERNITY 4.3X12.25 (PERSONAL CARE ITEMS) ×2 IMPLANT
PAD PREP 24X41 OB/GYN DISP (PERSONAL CARE ITEMS) ×2 IMPLANT
PENCIL SMOKE EVACUATOR (MISCELLANEOUS) ×2 IMPLANT
SCRUB CHG 4% DYNA-HEX 4OZ (MISCELLANEOUS) ×1 IMPLANT
SUT MAXON ABS #0 GS21 30IN (SUTURE) ×4 IMPLANT
SUT VIC AB 1 CT1 36 (SUTURE) ×6 IMPLANT
SUT VIC AB 2-0 CT1 36 (SUTURE) ×2 IMPLANT
SUT VIC AB 4-0 FS2 27 (SUTURE) ×2 IMPLANT

## 2021-03-25 NOTE — Progress Notes (Signed)
ROB - GBS, some cramping. Procedure RM

## 2021-03-25 NOTE — Anesthesia Procedure Notes (Signed)
Spinal  Patient location during procedure: OR Start time: 03/25/2021 11:15 PM End time: 03/25/2021 11:21 PM Reason for block: surgical anesthesia Staffing Performed: resident/CRNA  Anesthesiologist: Lenard Simmer, MD Resident/CRNA: Omer Jack, CRNA Preanesthetic Checklist Completed: patient identified, IV checked, site marked, risks and benefits discussed, surgical consent, monitors and equipment checked, pre-op evaluation and timeout performed Spinal Block Patient position: sitting Prep: DuraPrep Patient monitoring: heart rate, cardiac monitor, continuous pulse ox and blood pressure Approach: midline Location: L3-4 Injection technique: single-shot Needle Needle type: Sprotte  Needle gauge: 24 G Needle length: 9 cm Assessment Sensory level: T6 Events: CSF return

## 2021-03-25 NOTE — Procedures (Signed)
ECV  After informed consent, including reviewing the potential risks of pain, failure to turn fetus,  rupture of membranes, uterine abruption, bleeding and fetal death.  Patient opted to proceed with the procedure.  Ultrasound at bedside confirms breech presentation. Terbutaline 0.25 mg SQ was given  ECV was attempted under Ultrasound guidance.   The version was not successful after 2 attempts w minimal movement noted w the fetus (both a forward and backward rotation attempted).  FHR was 140 and reactive before and after the procedure.  Toco showed mild uterine irritability. Pt. Tolerated the procedure well.  There were no complications. Due to need for delivery due to preeclampsia, will proceed w cesarean section  The risks of cesarean section discussed with the patient included but were not limited to: bleeding which may require transfusion or reoperation; infection which may require antibiotics; injury to bowel, bladder, ureters or other surrounding organs; injury to the fetus; need for additional procedures including hysterectomy in the event of a life-threatening hemorrhage; placental abnormalities wth subsequent pregnancies, incisional problems, thromboembolic phenomenon and other postoperative/anesthesia complications. The patient concurred with the proposed plan, giving informed written consent for the procedure.   Annamarie Major, MD, [facility], [facility] 03/25/2021  10:44 PM

## 2021-03-25 NOTE — Progress Notes (Signed)
Routine Prenatal Care Visit  Subjective  Autumn Davies is a 30 y.o. [phone] at [redacted]w[redacted]d being seen today for ongoing prenatal care.  She is currently monitored for the following issues for this high-risk pregnancy and has Pelvic pain in female; Endometriosis determined by laparoscopy; CMT (Charcot-Marie-Tooth disease); Menometrorrhagia; CIN III (cervical intraepithelial neoplasia grade III) with severe dysplasia; S/P LEEP; Supervision of high risk pregnancy, antepartum; Obesity affecting pregnancy; Short cervical length during pregnancy in second trimester; Vaginal bleeding in pregnancy, third trimester; Headache in pregnancy, third trimester; Chronic insomnia; Irritable bowel syndrome with both constipation and diarrhea; Headache disorder; and Elevated blood pressure complicating pregnancy, antepartum, third trimester on their problem list.  ----------------------------------------------------------------------------------- Patient reports not feeling well the past 2 days with headache and general malaise. She denies visual changes or epigastric pain.   Contractions: Not present. Vag. Bleeding: None.  Movement: Present. Leaking Fluid denies.  ----------------------------------------------------------------------------------- The following portions of the patient's history were reviewed and updated as appropriate: allergies, current medications, past family history, past medical history, past social history, past surgical history and problem list. Problem list updated.  Objective  Blood pressure (!) 151/105, weight 214 lb (97.1 kg), last menstrual period 07/10/2020. Pregravid weight 194 lb (88 kg) Total Weight Gain 20 lb (9.072 kg)   Repeat BP with larger cuff: 132/98  Urinalysis: Urine Protein Small (1+)  Urine Glucose Negative  Fetal Status: Fetal Heart Rate (bpm): 139 Fundal Height: 37 cm Movement: Present     General:  Alert, oriented and cooperative. Patient is in no acute distress.   Skin: Skin is warm and dry. No rash noted.   Cardiovascular: Normal heart rate noted  Respiratory: Normal respiratory effort, no problems with respiration noted  Abdomen: Soft, gravid, appropriate for gestational age. Pain/Pressure: Present     Pelvic:  Cervical exam deferred        Extremities: Normal range of motion.  Edema: None  Mental Status: Normal mood and affect. Normal behavior. Normal judgment and thought content.   Assessment   30 y.o. [id number] at [redacted]w[redacted]d by  04/16/2021, by Last Menstrual Period presenting for routine prenatal visit  Plan   pregnancy3 Problems (from 07/10/20 to present)    Problem Noted Resolved   Short cervical length during pregnancy in second trimester 11/26/2020 by Conard Novak, MD No   Overview Addendum 01/12/2021  4:42 PM by Conard Novak, MD    - per MFM 2.5-2.7 cm, vaginal progesterone until 36 weeks - MFM to follow cervical length Q 2 weeks until stable - 0.9 cm in early May. No further ultrasound recommended, but there is a follow up scheduled. - s/p BMTZ - vaginal progesterone weekly until 36 weeks. - drop in cervical length of 2.6 cm in early April to 9 mm in late April      Obesity affecting pregnancy 09/12/2020 by Conard Novak, MD No   Overview Addendum 09/12/2020 11:40 AM by Conard Novak, MD    [ ]  early hgba1c - has family planning mcd. So, can't get this lab.      S/P LEEP 08/25/2020 by 08/27/2020, MD No   Overview Addendum 01/12/2021  4:44 PM by 01/14/2021, MD    [x]  TV cvx length at 16 wks - see shortened cervix problem      Supervision of high risk pregnancy, antepartum 08/25/2020 by , MD No   Overview Addendum 09/12/2020 11:19 AM by Nadara Mustard, MD    [facility]inic [facility] Prenatal  Labs  Dating L=9 Blood type: O/Positive/-- (12/27 0831)   Genetic Screen 1 Screen:    AFP:     Quad:     NIPS: Antibody:Negative (12/27 0831)  Anatomic [place]  Rubella: 2.13 (12/27 0831)  Varicella: immune   GTT Early:               Third trimester:  RPR: Non Reactive (12/27 0831)   Rhogam  HBsAg: Negative (12/27 0831)   TDaP vaccine                       Flu Shot: HIV: Non Reactive (12/27 0831)   Baby Food                                GBS:   Contraception  Pap: LEEP 7/21 for CIN III, due 7/22  CBB  no   CS/VBAC n/a   Support Person Gerilyn Pilgrim, husband             Elevated BP with headache: recommend go to L&D for PIH eval   Preterm labor symptoms and general obstetric precautions including but not limited to vaginal bleeding, contractions, leaking of fluid and fetal movement were reviewed in detail with the patient.   Return for scheduled prenatal visits.  [name], CNM 03/25/2021 4:38 PM

## 2021-03-25 NOTE — H&P (Signed)
Obstetrics Admission History & Physical   CC: Elevated blood pressures; pt has headaches  HPI:  30 y.o. [id number] @ [redacted]w[redacted]d (04/16/2021, by Last Menstrual Period). Admitted on 03/25/2021:   Patient Active Problem List   Diagnosis Date Noted   Elevated blood pressure complicating pregnancy, antepartum, third trimester 03/25/2021   Elevated blood pressure affecting pregnancy in third trimester, antepartum 03/25/2021   Preeclampsia 03/25/2021   Breech presentation 03/25/2021   Headache in pregnancy, third trimester 03/12/2021   Vaginal bleeding in pregnancy, third trimester 02/15/2021   Short cervical length during pregnancy in second trimester 11/26/2020   Obesity affecting pregnancy 09/12/2020   S/P LEEP 08/25/2020   Supervision of high risk pregnancy, antepartum 08/25/2020   CIN III (cervical intraepithelial neoplasia grade III) with severe dysplasia 03/18/2020   Irritable bowel syndrome with both constipation and diarrhea 12/07/2019   Headache disorder 07/19/2019   Menometrorrhagia 01/12/2018   Chronic insomnia 08/15/2017   Endometriosis determined by laparoscopy    Pelvic pain in female 12/25/2015   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  She denies edema, CP, SOB, change in vision, epigastric pain.  She has been noted to have elevated BPs at times thispast 1-2 weeks and today was elevated in office.  She has prior h/o preeclampsia with 36 week IOL and successful vag delivery, no other reported complications then (9 years ag) or with this pregnancy.  Also, breech identified today.  Prenatal care at: at [facility]. Pregnancy complicated by pre-eclampsia.  ROS: A review of systems was performed and negative, except as stated in the above HPI. PMHx:  Past Medical History:  Diagnosis Date   Anxiety    Charcot-Marie disease    AFFECTS MOSTLY HER LEGS-CAUSES CRAMPS AND RESTLESS LEG-TAKES GABAPENTIN AT NIGHT WHICH  SEEMS TO HELP   Endometriosis determined by laparoscopy    Migraines    PSHx:  Past Surgical History:  Procedure Laterality Date   LAPAROSCOPY N/A 12/25/2015   Procedure: LAPAROSCOPY DIAGNOSTIC;  Surgeon: Conard Novak, MD;  Location: [facility] ORS;  Service: Gynecology;  Laterality: N/A;   LEEP N/A 03/18/2020   Procedure: LOOP ELECTROSURGICAL EXCISION PROCEDURE (LEEP);  Surgeon: Conard Novak, MD;  Location: [facility] ORS;  Service: Gynecology;  Laterality: N/A;   Medications:  Medications Prior to Admission  Medication Sig Dispense Refill Last Dose   gabapentin (NEURONTIN) 300 MG capsule Take 1 capsule by mouth 3 (three) times daily.   03/24/2021   Prenatal Vit-Fe Fumarate-FA (MULTIVITAMIN-PRENATAL) 27-0.8 MG TABS tablet Take 1 tablet by mouth daily at 12 noon.   03/25/2021   Allergies: is allergic to citalopram, sulfa antibiotics, and sulfamethoxazole-trimethoprim. OBHx:  OB History  Gravida Para Term Preterm AB Living  3 1 0 1 1 1   SAB IAB Ectopic Multiple Live Births  0 0 0 0 1    # Outcome Date GA Lbr Len/2nd Weight Sex Delivery Anes PTL Lv  3 Current           2 Preterm 12/19/11 [redacted]w[redacted]d   F Vag-Spont   LIV  1 AB            [redacted]w[redacted]d except as detailed in HPI.MVE:HMCNOBSJ/[id number]  No family history of birth defects. Soc Hx: Never smoker, Alcohol: none, and Recreational drug use: none  Objective:   Vitals:   03/25/21 1819 03/25/21 1834  BP: 135/88 135/88  Pulse: 89 75  Resp:    Temp:  Constitutional: Well nourished, well developed female in no acute distress.  HEENT: normal Skin: Warm and dry.  Cardiovascular:Regular rate and rhythm.   Extremity: trace to 1+ bilateral pedal edema Respiratory: Clear to auscultation bilateral. Normal respiratory effort Abdomen: gravid, ND, FHT present, mild tenderness on exam Back: no CVAT Neuro: DTRs 2+, Cranial nerves grossly intact Psych: Alert and Oriented x3. No memory deficits. Normal mood and affect.  MS: normal gait, normal  bilateral lower extremity ROM/strength/stability.  Pelvic exam: is not limited by body habitus EGBUS: within normal limits Vagina: within normal limits and with normal mucosa Cervix: EXTERNAL GENITALIA: normal appearing vulva with no masses, tenderness or lesions CERVIX: 1-2 cm dilated, 80 effaced, -3 station, presenting part Breech VAGINA: no abnormalities noted MEMBRANES: intact Uterus: No contractions observed for 30+ minutes.  Adnexa: not evaluated  EFM:FHR: 140 bpm, variability: moderate,  accelerations:  Present,  decelerations:  Absent Toco: None   Perinatal info:  Blood type: O positive Rubella- Immune Varicella -Immune TDaP Given during third trimester of this pregnancy RPR NR / HIV Neg/ HBsAg Neg   Assessment & Plan:   30 y.o. [id number] @ [redacted]w[redacted]d, Admitted on 03/25/2021:Preeclampsia with headache, and urine protein c/w this dx As she has had this w prior pregnancy, has had BP elevations developing, and has headaches that are somewhat unrelenting, and she is 37 weeks in a few hours, it is advisable to consider delivery.  As she is breech, I would recommend ECV and then IOL if successful.  She is counseled as to the risks and benefits of ECV.  She understands CS as alternative option, or if unsuccessful ECV.  Risks of preeclampsia and of prematurity (mild, late preterm) also discussed.    Plan terbutaline and US guided ECV, and EFM before and after.  Annamarie Major, MD, [facility], [facility] 03/25/2021  7:30 PM

## 2021-03-25 NOTE — Anesthesia Preprocedure Evaluation (Signed)
Anesthesia Evaluation  Patient identified by MRN, date of birth, ID band Patient awake    Reviewed: Allergy & Precautions, NPO status , Patient's Chart, lab work & pertinent test results  History of Anesthesia Complications Negative for: history of anesthetic complications  Airway Mallampati: IV       Dental no notable dental hx. (+) Dental Advidsory Given, Teeth Intact   Pulmonary neg pulmonary ROS, neg shortness of breath, neg sleep apnea, neg COPD, neg recent URI, Not current smoker,           Cardiovascular Exercise Tolerance: Good hypertension (Pre-eclampsia), (-) angina(-) Past MI, (-) Cardiac Stents and (-) CHF + dysrhythmias (-) Valvular Problems/Murmurs     Neuro/Psych neg Seizures Anxiety  Neuromuscular disease (Charcot-Marie-Tooth dz, leg weakness)    GI/Hepatic Neg liver ROS, GERD  ,  Endo/Other  negative endocrine ROSneg diabetes  Renal/GU negative Renal ROS     Musculoskeletal   Abdominal   Peds  Hematology   Anesthesia Other Findings Past Medical History: No date: Anxiety 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     Comment:  AFFECTS MOSTLY HER LEGS-CAUSES CRAMPS AND RESTLESS               LEG-TAKES GABAPENTIN AT NIGHT WHICH SEEMS TO HELP No date: Endometriosis determined by laparoscopy No date: Migraines   Reproductive/Obstetrics                             Anesthesia Physical  Anesthesia Plan  ASA: 2  Anesthesia Plan: Spinal   Post-op Pain Management:    Induction: Intravenous  PONV Risk Score and Plan: 3 and Ondansetron  Airway Management Planned: Natural Airway  Additional Equipment:   Intra-op Plan:   Post-operative Plan:   Informed Consent: I have reviewed the patients History and Physical, chart, labs and discussed the procedure including the risks, benefits and alternatives for the proposed anesthesia with the patient or authorized representative who has  indicated his/her understanding and acceptance.       Plan Discussed with:   Anesthesia Plan Comments:         Anesthesia Quick Evaluation

## 2021-03-26 ENCOUNTER — Encounter: Payer: Self-pay | Admitting: Obstetrics & Gynecology

## 2021-03-26 LAB — CBC
HCT: 34 % — ABNORMAL LOW (ref 36.0–46.0)
Hemoglobin: 11.8 g/dL — ABNORMAL LOW (ref 12.0–15.0)
MCH: 31.1 pg (ref 26.0–34.0)
MCHC: 34.7 g/dL (ref 30.0–36.0)
MCV: 89.7 fL (ref 80.0–100.0)
Platelets: 193 10*3/uL (ref 150–400)
RBC: 3.79 MIL/uL — ABNORMAL LOW (ref 3.87–5.11)
RDW: 14.3 % (ref 11.5–15.5)
WBC: 19.9 10*3/uL — ABNORMAL HIGH (ref 4.0–10.5)
nRBC: 0 % (ref 0.0–0.2)

## 2021-03-26 MED ORDER — NALOXONE HCL 0.4 MG/ML IJ SOLN: 0.4000 mg | INTRAMUSCULAR | Status: DC | PRN

## 2021-03-26 MED ORDER — KETOROLAC TROMETHAMINE 30 MG/ML IJ SOLN
30.0000 mg | Freq: Four times a day (QID) | INTRAMUSCULAR | Status: AC
  Administered 2021-03-26 (×3): 30 mg via INTRAVENOUS
  Filled 2021-03-26 (×4): qty 1

## 2021-03-26 MED ORDER — MENTHOL 3 MG MT LOZG
1.0000 | LOZENGE | OROMUCOSAL | Status: DC | PRN
  Filled 2021-03-26: qty 9

## 2021-03-26 MED ORDER — LACTATED RINGERS IV SOLN: INTRAVENOUS | Status: DC

## 2021-03-26 MED ORDER — ACETAMINOPHEN 325 MG PO TABS
650.0000 mg | ORAL_TABLET | ORAL | Status: DC | PRN
  Administered 2021-03-26 – 2021-03-27 (×6): 650 mg via ORAL
  Filled 2021-03-26 (×7): qty 2

## 2021-03-26 MED ORDER — OXYTOCIN-SODIUM CHLORIDE 30-0.9 UT/500ML-% IV SOLN
2.5000 [IU]/h | INTRAVENOUS | Status: AC
  Administered 2021-03-26: 2.5 [IU]/h via INTRAVENOUS

## 2021-03-26 MED ORDER — SENNOSIDES-DOCUSATE SODIUM 8.6-50 MG PO TABS
2.0000 | ORAL_TABLET | ORAL | Status: DC
  Administered 2021-03-26 – 2021-03-27 (×2): 2 via ORAL
  Filled 2021-03-26 (×2): qty 2

## 2021-03-26 MED ORDER — SIMETHICONE 80 MG PO CHEW
80.0000 mg | CHEWABLE_TABLET | ORAL | Status: DC | PRN
  Administered 2021-03-26: 80 mg via ORAL
  Filled 2021-03-26: qty 1

## 2021-03-26 MED ORDER — DIPHENHYDRAMINE HCL 25 MG PO CAPS: 25.0000 mg | ORAL_CAPSULE | Freq: Four times a day (QID) | ORAL | Status: DC | PRN

## 2021-03-26 MED ORDER — ZOLPIDEM TARTRATE 5 MG PO TABS: 5.0000 mg | ORAL_TABLET | Freq: Every evening | ORAL | Status: DC | PRN

## 2021-03-26 MED ORDER — KETOROLAC TROMETHAMINE 30 MG/ML IJ SOLN: 30.0000 mg | Freq: Four times a day (QID) | INTRAMUSCULAR | Status: AC | PRN

## 2021-03-26 MED ORDER — SIMETHICONE 80 MG PO CHEW
80.0000 mg | CHEWABLE_TABLET | Freq: Three times a day (TID) | ORAL | Status: DC
  Administered 2021-03-26 – 2021-03-27 (×5): 80 mg via ORAL
  Filled 2021-03-26 (×5): qty 1

## 2021-03-26 MED ORDER — 0.9 % SODIUM CHLORIDE (POUR BTL) OPTIME
TOPICAL | Status: DC | PRN
  Administered 2021-03-26: 1000 mL

## 2021-03-26 MED ORDER — DIBUCAINE (PERIANAL) 1 % EX OINT: 1.0000 "application " | TOPICAL_OINTMENT | CUTANEOUS | Status: DC | PRN

## 2021-03-26 MED ORDER — KETOROLAC TROMETHAMINE 30 MG/ML IJ SOLN
30.0000 mg | Freq: Four times a day (QID) | INTRAMUSCULAR | Status: AC | PRN
  Administered 2021-03-26 (×2): 30 mg via INTRAVENOUS
  Filled 2021-03-26: qty 1

## 2021-03-26 MED ORDER — OXYCODONE HCL 5 MG PO TABS
5.0000 mg | ORAL_TABLET | ORAL | Status: DC | PRN
  Administered 2021-03-26 (×3): 5 mg via ORAL
  Filled 2021-03-26 (×3): qty 1

## 2021-03-26 MED ORDER — BUPIVACAINE ON-Q PAIN PUMP (FOR ORDER SET NO CHG)
INJECTION | Status: DC
  Filled 2021-03-26: qty 1

## 2021-03-26 MED ORDER — SODIUM CHLORIDE 0.9% FLUSH: 3.0000 mL | INTRAVENOUS | Status: DC | PRN

## 2021-03-26 MED ORDER — COCONUT OIL OIL
1.0000 "application " | TOPICAL_OIL | Status: DC | PRN
  Filled 2021-03-26: qty 120

## 2021-03-26 MED ORDER — NALBUPHINE HCL 10 MG/ML IJ SOLN
5.0000 mg | Freq: Once | INTRAMUSCULAR | Status: DC | PRN
Start: 2021-03-26 — End: 2021-03-27

## 2021-03-26 MED ORDER — BUPIVACAINE HCL (PF) 0.5 % IJ SOLN
INTRAMUSCULAR | Status: DC | PRN
  Administered 2021-03-25: 30 mL

## 2021-03-26 MED ORDER — IBUPROFEN 600 MG PO TABS
600.0000 mg | ORAL_TABLET | Freq: Four times a day (QID) | ORAL | Status: DC
  Administered 2021-03-27 (×2): 600 mg via ORAL
  Filled 2021-03-26 (×2): qty 1

## 2021-03-26 MED ORDER — FENTANYL CITRATE (PF) 100 MCG/2ML IJ SOLN: 25.0000 ug | INTRAMUSCULAR | Status: DC | PRN

## 2021-03-26 MED ORDER — OXYCODONE HCL 5 MG PO TABS
5.0000 mg | ORAL_TABLET | ORAL | Status: DC | PRN
  Administered 2021-03-27: 5 mg via ORAL
  Administered 2021-03-27: 10 mg via ORAL
  Filled 2021-03-26: qty 2
  Filled 2021-03-26: qty 1

## 2021-03-26 MED ORDER — MEPERIDINE HCL 25 MG/ML IJ SOLN
6.2500 mg | INTRAMUSCULAR | Status: DC | PRN
  Filled 2021-03-26: qty 1

## 2021-03-26 MED ORDER — NALBUPHINE HCL 10 MG/ML IJ SOLN
5.0000 mg | INTRAMUSCULAR | Status: DC | PRN
Start: 2021-03-26 — End: 2021-03-27

## 2021-03-26 MED ORDER — PROMETHAZINE HCL 25 MG/ML IJ SOLN: 6.2500 mg | INTRAMUSCULAR | Status: DC | PRN

## 2021-03-26 MED ORDER — PRENATAL MULTIVITAMIN CH
1.0000 | ORAL_TABLET | Freq: Every day | ORAL | Status: DC
  Administered 2021-03-26 – 2021-03-27 (×2): 1 via ORAL
  Filled 2021-03-26 (×2): qty 1

## 2021-03-26 MED ORDER — DIPHENHYDRAMINE HCL 25 MG PO CAPS: 25.0000 mg | ORAL_CAPSULE | ORAL | Status: DC | PRN

## 2021-03-26 MED ORDER — WITCH HAZEL-GLYCERIN EX PADS: 1.0000 "application " | MEDICATED_PAD | CUTANEOUS | Status: DC | PRN

## 2021-03-26 MED ORDER — ONDANSETRON HCL 4 MG/2ML IJ SOLN
4.0000 mg | Freq: Three times a day (TID) | INTRAMUSCULAR | Status: DC | PRN
  Administered 2021-03-26: 4 mg via INTRAVENOUS
  Filled 2021-03-26: qty 2

## 2021-03-26 MED ORDER — DIPHENHYDRAMINE HCL 50 MG/ML IJ SOLN: 12.5000 mg | INTRAMUSCULAR | Status: DC | PRN

## 2021-03-26 MED ORDER — NALOXONE HCL 4 MG/10ML IJ SOLN
1.0000 ug/kg/h | INTRAVENOUS | Status: DC | PRN
  Filled 2021-03-26: qty 5

## 2021-03-26 MED ORDER — IBUPROFEN 600 MG PO TABS: 600.0000 mg | ORAL_TABLET | Freq: Four times a day (QID) | ORAL | Status: DC

## 2021-03-26 MED ORDER — MORPHINE SULFATE (PF) 2 MG/ML IV SOLN: 1.0000 mg | INTRAVENOUS | Status: DC | PRN

## 2021-03-26 NOTE — Transfer of Care (Signed)
Immediate Anesthesia Transfer of Care Note  Patient: Autumn Davies  Procedure(s) Performed: CESAREAN SECTION  Patient Location: PACU and Mother/Baby  Anesthesia Type:Spinal  Level of Consciousness: awake, alert  and oriented  Airway & Oxygen Therapy: Patient Spontanous Breathing  Post-op Assessment: Report given to RN and Post -op Vital signs reviewed and stable  Post vital signs: Reviewed and stable  Last Vitals:  Vitals Value Taken Time  BP 168/90 03/26/21 0021  Temp    Pulse    Resp 16 03/26/21 0021  SpO2 99 % 03/26/21 0021    Last Pain:  Vitals:   03/25/21 1942  TempSrc: Oral  PainSc:          Complications: No notable events documented.

## 2021-03-26 NOTE — Progress Notes (Signed)
Obstetric Postpartum/PostOperative Daily Progress Note Subjective:  30 y.o. G3O7564 post-operative day # 1 status post primary cesarean delivery.  She  is not yet  ambulating, is tolerating po,  is not yet  voiding spontaneously.  Foley catheter indwelling. Her pain is well controlled on PO pain medications and On Q pump. Her lochia is less than menses. She reports baby was fussy at breast and she has supplemented with formula. She is encouraged to keep putting baby to breast and can pump if she desires. Advised LC should be in to assess.    Medications SCHEDULED MEDICATIONS   bupivacaine       ketorolac  30 mg Intravenous Q6H   Followed by   Melene Muller ON 03/27/2021] ibuprofen  600 mg Oral Q6H   prenatal multivitamin  1 tablet Oral Q1200   senna-docusate  2 tablet Oral Q24H   simethicone  80 mg Oral TID PC   sodium citrate-citric acid        MEDICATION INFUSIONS   bupivacaine 0.25 % ON-Q pump DUAL CATH 400 mL     bupivacaine ON-Q pain pump     ceFAZolin     lactated ringers Stopped (03/26/21 0645)   naLOXone (NARCAN) adult infusion for PRURITIS     oxytocin Stopped (03/26/21 0335)    PRN MEDICATIONS  acetaminophen, coconut oil, witch hazel-glycerin **AND** dibucaine, diphenhydrAMINE **OR** diphenhydrAMINE, diphenhydrAMINE, ketorolac **OR** ketorolac, menthol-cetylpyridinium, morphine injection, nalbuphine **OR** nalbuphine, nalbuphine **OR** nalbuphine, naloxone **AND** sodium chloride flush, naLOXone (NARCAN) adult infusion for PRURITIS, ondansetron (ZOFRAN) IV, oxyCODONE, oxyCODONE, simethicone, zolpidem    Objective:   Vitals:   03/26/21 0540 03/26/21 0550 03/26/21 0639 03/26/21 0708  BP:   117/82 112/80  Pulse:   65 70  Resp:    16  Temp:    98.1 F (36.7 C)  TempSrc:    Oral  SpO2: 97% 97%  98%  Weight:      Height:        Current Vital Signs 24h Vital Sign Ranges  T 98.1 F (36.7 C) Temp  Avg: 98 F (36.7 C)  Min: 97.7 F (36.5 C)  Max: 98.3 F (36.8 C)  BP 112/80 BP   Min: 99/76  Max: 168/90  HR 70 Pulse  Avg: 80.1  Min: 61  Max: 96  RR 16 Resp  Avg: 17.9  Min: 16  Max: 23  SaO2 98 % Room Air SpO2  Avg: 97.3 %  Min: 96 %  Max: 99 %       24 Hour I/O Current Shift I/O  Time Ins Outs 07/27 0701 - 07/28 0700 In: 1116.5 [I.V.:1016.5] Out: 1115 [Urine:615] 07/28 0701 - 07/28 1900 In: -  Out: 175 [Urine:175]   General: NAD Pulmonary: no increased work of breathing Abdomen: non-distended, non-tender, fundus firm at level of umbilicus Inc: Clean/dry/intact, On Q intact Extremities: no edema, no erythema, no tenderness  Labs:  Recent Labs  Lab 03/25/21 1741 03/26/21 0504  WBC 11.9* 19.9*  HGB 13.5 11.8*  HCT 39.8 34.0*  PLT 218 193     Assessment:   30 y.o. [id number] postoperative day # 1 status post primary cesarean section, lactating  Plan:  1) Acute blood loss anemia - hemodynamically stable and asymptomatic - po ferrous sulfate  2) O POS / Rubella 2.13 (12/27 0831)/ Varicella Immune  3) TDAP status given antepartum  4) Breast and Formula feeding/Contraception =  Nexplanon  5) Disposition: continue current care   [name], CNM 03/26/2021 9:45 AM

## 2021-03-26 NOTE — Op Note (Signed)
Cesarean Section Procedure Note Indications: malpresentation: Breech and Preeclampsia  Pre-operative Diagnosis:  malpresentation: Breech and Preclampsia at 37 weeks Post-operative Diagnosis: same, delivered. Procedure: Low Transverse Cesarean Section Surgeon: Annamarie Major, MD, FACOG Assistant(s): Paula Compton, CNM, No other capable assistant available, in surgery requiring high level assistant. Anesthesia: Spinal anesthesia Estimated Blood Loss:300  Complications: None; patient tolerated the procedure well. Disposition: PACU - hemodynamically stable. Condition: stable  Findings: A female infant in the breech complete presentation. Amniotic fluid - Clear  Birth weight 3000 g.  Apgars of 2 and 9.  Intact placenta with a three-vessel cord. Grossly normal uterus, tubes and ovaries bilaterally. No intraabdominal adhesions were noted.  Procedure Details   The patient was taken to Operating Room, identified as the correct patient and the procedure verified as C-Section Delivery. A Time Out was held and the above information confirmed. After induction of anesthesia, the patient was draped and prepped in the usual sterile manner. A Pfannenstiel incision was made and carried down through the subcutaneous tissue to the fascia. Fascial incision was made and extended transversely with the Mayo scissors. The fascia was separated from the underlying rectus tissue superiorly and inferiorly. The peritoneum was identified and entered bluntly. Peritoneal incision was extended longitudinally. The utero-vesical peritoneal reflection was incised transversely and a bladder flap was created digitally.  A low transverse hysterotomy was made. The fetus was delivered atraumatically. The umbilical cord was clamped x2 and cut and the infant was handed to the awaiting pediatricians. The placenta was removed intact and appeared normal with a 3-vessel cord.  The uterus was exteriorized and cleared of all clot and debris.  The hysterotomy was closed with running sutures of 0 Vicryl suture. A second imbricating layer was placed with the same suture. Excellent hemostasis was observed. The uterus was returned to the abdomen. The pelvis was irrigated and again, excellent hemostasis was noted.  The On Q Pain pump System was then placed.  Trocars were placed through the abdominal wall into the subfascial space and these were used to thread the silver soaker cathaters into place.The rectus fascia was then reapproximated with running sutures of Maxon, with careful placement not to incorporate the cathaters. Subcutaneous tissues are then irrigated with saline and hemostasis assured.  Skin is then closed with 4-0 vicryl suture in a subcuticular fashion followed by skin adhesive.  The surgical assistant performed tissue retraction, assistance with suturing, and fundal pressure.   The cathaters are flushed each with 5 mL of Bupivicaine and stabilized into place with dressing. Instrument, sponge, and needle counts were correct prior to the abdominal closure and at the conclusion of the case.  The patient tolerated the procedure well and was transferred to the recovery room in stable condition.   Annamarie Major, MD, [facility], [facility] 03/26/2021  12:12 AM

## 2021-03-26 NOTE — Discharge Summary (Signed)
Postpartum Discharge Summary   Patient Name: Autumn Davies DOB: Apr 11, 1991 MRN: [id number]  Date of admission: 03/25/2021 Delivery date:03/25/2021  Delivering provider: Gae Dry  Date of discharge: 03/27/2021  Admitting diagnosis: Elevated blood pressure affecting pregnancy in third trimester, antepartum [O16.3] Preeclampsia [O14.90] Intrauterine pregnancy: [redacted]w[redacted]d     Secondary diagnosis:  Active Problems:   Headache disorder   Elevated blood pressure affecting pregnancy in third trimester, antepartum   Preeclampsia   Breech presentation  Additional problems: Preeclampsia, Breech    Discharge diagnosis: Preterm Pregnancy Delivered and Preeclampsia (mild)                                              Post partum procedures: none Augmentation: N/A Complications: None  Hospital course: Sceduled C/S   30 y.o. yo G3P0111 at [redacted]w[redacted]d was admitted to the hospital 03/25/2021 for ECV as fetus was breech and she was in need of IOL or delivery due to preeclampsia as it was dx in mild state and she was becoming 37 weeks full term; as ECV was not succerssful she was scheduled cesarean section with the following indication:Malpresentation.Delivery details are as follows:  Membrane Rupture Time/Date: 11:37 PM ,03/26/2021   Delivery Method:C-Section, Low Transverse  Details of operation can be found in separate operative note.  Patient had an uncomplicated postpartum course.  She is ambulating, tolerating a regular diet, passing flatus, and urinating well. Patient is discharged home in stable condition on  03/27/21        Newborn Data: Birth date:03/25/2021  Birth time:11:39 PM  Gender:Female  Living status:Living  Apgars:2 ,9  Weight:3000 g     Magnesium Sulfate received: No BMZ received: No Rhophylac:No MMR:No T-DaP:Given prenatally Flu: No Transfusion:No  Physical exam  Vitals:   03/27/21 0000 03/27/21 0100 03/27/21 0800 03/27/21 1240  BP:   133/86 122/75  Pulse: 66 81 79 71   Resp:   18   Temp:   98.4 F (36.9 C)   TempSrc:      SpO2: 95% 93% 97%   Weight:      Height:       General: alert, cooperative, and no distress Lochia: appropriate Uterine Fundus: firm Incision: Healing well with no significant drainage DVT Evaluation: No evidence of DVT seen on physical exam. Labs: Lab Results  Component Value Date   WBC 19.9 (H) 03/26/2021   HGB 11.8 (L) 03/26/2021   HCT 34.0 (L) 03/26/2021   MCV 89.7 03/26/2021   PLT 193 03/26/2021   CMP Latest Ref Rng & Units 03/25/2021  Glucose 70 - 99 mg/dL 101(H)  BUN 6 - 20 mg/dL 8  Creatinine 0.44 - 1.00 mg/dL 0.60  Sodium 135 - 145 mmol/L 136  Potassium 3.5 - 5.1 mmol/L 4.4  Chloride 98 - 111 mmol/L 107  CO2 22 - 32 mmol/L 23  Calcium 8.9 - 10.3 mg/dL 9.1  Total Protein 6.5 - 8.1 g/dL 6.7  Total Bilirubin 0.3 - 1.2 mg/dL 0.9  Alkaline Phos 38 - 126 U/L 109  AST 15 - 41 U/L 15  ALT 0 - 44 U/L 13   Edinburgh Score: No flowsheet data found.    After visit meds:  Allergies as of 03/27/2021       Reactions   Citalopram Other (See Comments)   Headache   Sulfa Antibiotics Rash   Red rash  all over body   Sulfamethoxazole-trimethoprim Itching, Rash        Medication List     TAKE these medications    gabapentin 300 MG capsule Commonly known as: NEURONTIN Take 1 capsule by mouth 3 (three) times daily.   ibuprofen 600 MG tablet Commonly known as: ADVIL Take 1 tablet (600 mg total) by mouth every 6 (six) hours as needed.   labetalol 200 MG tablet Commonly known as: NORMODYNE Take 1 tablet (200 mg total) by mouth 2 (two) times daily.   multivitamin-prenatal 27-0.8 MG Tabs tablet Take 1 tablet by mouth daily at 12 noon.   oxyCODONE-acetaminophen 5-325 MG tablet Commonly known as: Percocet Take 1 tablet by mouth every 4 (four) hours as needed for severe pain.               Discharge Care Instructions  (From admission, onward)           Start     Ordered   03/27/21 0000   Discharge wound care:       Comments: You may apply a light dressing for minor discharge from the incision or to keep waistbands of clothing from rubbing.  You may also have been discharge with a clear dressing in which case this will be removed at your postoperative clinic visit.  You may shower, use soap on your incision.  Avoid baths or soaking the incision in the first 6 weeks following your surgery.Marland Kitchen   03/27/21 1612             Discharge home in stable condition Infant Feeding:  breast & bottle Infant Disposition:home with mother Discharge instruction: per After Visit Summary and Postpartum booklet. Activity: Advance as tolerated. Pelvic rest for 6 weeks.  Diet: routine diet Anticipated Birth Control: Unsure Postpartum Appointment:6 weeks Additional Postpartum F/U: Incision check 1 week and BP check 1 week Future Appointments: Future Appointments  Date Time Provider Guinda  04/02/2021  4:30 PM Rod Can, CNM [facility] None  04/10/2021  4:30 PM Rod Can, CNM [facility] None   Follow up Visit:  Follow-up Information     Gae Dry, MD. Schedule an appointment as soon as possible for a visit in 1 week(s).   Specialty: Obstetrics and Gynecology Why: For Post Op and BP check Contact information: [address] [place] [address] [phone]                     03/27/2021 Malachy Mood, MD

## 2021-03-27 ENCOUNTER — Other Ambulatory Visit: Payer: Self-pay | Admitting: Obstetrics and Gynecology

## 2021-03-27 ENCOUNTER — Ambulatory Visit: Payer: Self-pay

## 2021-03-27 MED ORDER — LABETALOL HCL 200 MG PO TABS
200.0000 mg | ORAL_TABLET | Freq: Two times a day (BID) | ORAL | Status: DC
  Administered 2021-03-27: 200 mg via ORAL
  Filled 2021-03-27: qty 1

## 2021-03-27 MED ORDER — IBUPROFEN 600 MG PO TABS: 600.0000 mg | ORAL_TABLET | Freq: Four times a day (QID) | ORAL | 3 refills | Status: DC | PRN

## 2021-03-27 MED ORDER — OXYCODONE-ACETAMINOPHEN 5-325 MG PO TABS: 1.0000 | ORAL_TABLET | ORAL | 0 refills | Status: DC | PRN

## 2021-03-27 MED ORDER — LABETALOL HCL 200 MG PO TABS: 200.0000 mg | ORAL_TABLET | Freq: Two times a day (BID) | ORAL | 0 refills | Status: DC

## 2021-03-27 NOTE — Progress Notes (Addendum)
Obstetric Postpartum/PostOperative Daily Progress Note Subjective:  30 y.o. O6Z1245 post-operative day # 2 status post primary cesarean delivery.  She is ambulating, is tolerating po, is voiding spontaneously.  Her pain is well controlled on PO pain medications. Her lochia is less than menses. She reports breastfeeding is going well and she is also feeding formula. She expresses a desire to go home later today if all is well. We discussed possible need to start anti-hypertensive.   Medications SCHEDULED MEDICATIONS   ibuprofen  600 mg Oral Q6H   prenatal multivitamin  1 tablet Oral Q1200   senna-docusate  2 tablet Oral Q24H   simethicone  80 mg Oral TID PC    MEDICATION INFUSIONS   bupivacaine 0.25 % ON-Q pump DUAL CATH 400 mL     bupivacaine ON-Q pain pump     lactated ringers Stopped (03/26/21 0645)   naLOXone (NARCAN) adult infusion for PRURITIS      PRN MEDICATIONS  acetaminophen, coconut oil, witch hazel-glycerin **AND** dibucaine, diphenhydrAMINE **OR** diphenhydrAMINE, diphenhydrAMINE, menthol-cetylpyridinium, morphine injection, nalbuphine **OR** nalbuphine, nalbuphine **OR** nalbuphine, naloxone **AND** sodium chloride flush, naLOXone (NARCAN) adult infusion for PRURITIS, ondansetron (ZOFRAN) IV, oxyCODONE, oxyCODONE, simethicone, zolpidem    Objective:   Vitals:   03/26/21 2332 03/27/21 0000 03/27/21 0100 03/27/21 0800  BP: (!) 136/96   133/86  Pulse: 80 66 81 79  Resp: 18   18  Temp: 98.3 F (36.8 C)   98.4 F (36.9 C)  TempSrc: Oral     SpO2: 100% 95% 93% 97%  Weight:      Height:        Current Vital Signs 24h Vital Sign Ranges  T 98.4 F (36.9 C) Temp  Avg: 98.3 F (36.8 C)  Min: 98 F (36.7 C)  Max: 98.4 F (36.9 C)  BP 133/86 BP  Min: 111/65  Max: 136/96  HR 79 Pulse  Avg: 74.3  Min: 65  Max: 84  RR 18 Resp  Avg: 18  Min: 18  Max: 18  SaO2 97 % Room Air SpO2  Avg: 96.6 %  Min: 93 %  Max: 100 %       24 Hour I/O Current Shift I/O  Time Ins Outs 07/28  0701 - 07/29 0700 In: 480 [P.O.:480] Out: 625 [Urine:625] 07/29 0701 - 07/29 1900 In: 240 [P.O.:240] Out: -   General: NAD Pulmonary: no increased work of breathing Abdomen: non-distended, non-tender, fundus firm at level of umbilicus Inc: Clean/dry/intact Extremities: no edema, no erythema, no tenderness  Labs:  Recent Labs  Lab 03/25/21 1741 03/26/21 0504  WBC 11.9* 19.9*  HGB 13.5 11.8*  HCT 39.8 34.0*  PLT 218 193     Assessment:   30 y.o. [id number] postoperative day # 2 status post primary cesarean section, lactating  Plan:  1) Acute blood loss anemia - hemodynamically stable and asymptomatic - po ferrous sulfate  2) Mild preeclampsia: mildly elevated blood pressure- start Labetalol 200 mg BID  3) O POS / Rubella 2.13 (12/27 0831)/ Varicella Immune  4) TDAP status Up to date  5) Breast and formula feeding/Contraception =  Nexplanon  6) Disposition: continue current care and reevaluate for discharge later today   [name], CNM 03/27/2021 10:41 AM

## 2021-03-27 NOTE — Progress Notes (Signed)
Discharge teaching done and AVS reviewed patient verbalizes understanding.

## 2021-03-27 NOTE — Lactation Note (Signed)
This note was copied from a baby's chart. Lactation Consultation Note  Patient Name: Autumn Davies [id number] Date: 03/27/2021 Reason for consult: Follow-up assessment;Late-preterm 34-36.6wks Age:28 hours  Maternal Data Has patient been taught Hand Expression?: Yes Does the patient have breastfeeding experience prior to this delivery?: Yes How long did the patient breastfeed?: 30yr old  Feeding Mother's Current Feeding Choice: Breast Milk and Formula Nipple Type: Slow - flow Mom has been unable to latch baby to breast well, has mostly fed formula with few attempts at breast, has pumped with her own personal ELVIE pump once, but did not obtain any colostrum, attempted latching baby to left breast in cradle hold, baby would cry and then root, but not clamp to breast, attempted with 24 mm nipple shield since baby was used to bottle, place formula in shield after mom unable to express colostrum into shield, baby would open mouth and grasp shield but would not suck but once or twice and then pull off and cry.  Baby tiring and falling asleep at this point, given to dad to formula feed, mom encouraged to continue attempting at breast and if baby will not latch, pump at least every 3 hrs to encourage milk production, mom and baby being d/c'd this pm       LATCH Score Latch: Repeated attempts needed to sustain latch, nipple held in mouth throughout feeding, stimulation needed to elicit sucking reflex.  Audible Swallowing: None  Type of Nipple: Everted at rest and after stimulation  Comfort (Breast/Nipple): Soft / non-tender  Hold (Positioning): Assistance needed to correctly position infant at breast and maintain latch.  LATCH Score: 6   Lactation Tools Discussed/Used  Lc name and no written on white board Interventions Interventions: Breast feeding basics reviewed;Assisted with latch;Skin to skin;Hand express;Support pillows;Education  Discharge Pump: Personal WIC Program:  No  Consult Status Consult Status: PRN    Dyann Kief 03/27/2021, 6:42 PM

## 2021-03-28 NOTE — Anesthesia Postprocedure Evaluation (Signed)
Anesthesia Post Note  Patient: Autumn Davies  Procedure(s) Performed: CESAREAN SECTION  Anesthesia Type: Spinal Level of consciousness: awake and alert Pain management: pain level controlled Vital Signs Assessment: post-procedure vital signs reviewed and stable Respiratory status: spontaneous breathing, nonlabored ventilation and respiratory function stable Cardiovascular status: blood pressure returned to baseline Postop Assessment: no headache, no backache, patient able to bend at knees and able to ambulate Anesthetic complications: no Comments: Patient was stable for discharge per nursing notes and did not have any complications from the spinal.   No notable events documented.   Last Vitals:  Vitals:   03/27/21 1240 03/27/21 1500  BP: 122/75 108/77  Pulse: 71 74  Resp:    Temp:    SpO2:      Last Pain:  Vitals:   03/27/21 1241  TempSrc:   PainSc: 6                  Lenard Simmer

## 2021-03-30 ENCOUNTER — Telehealth: Payer: Self-pay

## 2021-03-30 NOTE — Telephone Encounter (Signed)
Pt called after hour nurse 03/29/21 11:28am c/o had c/s on Wed d/c'd Fri; started having back pain yesterday evening; pain is in the center of her lower back where spinal was done; pain level 8-9/10; relieved with sitting and gets worse when standing for longer periods of time.  Pt was adv by on call to go to ED.  Called pt to f/u; states she didn't want to go to ED b/c she won't be able to have her baby with her.  Adv if pain is from her spinal anesthesia needs to eval so she needs to go to ED.  Pt also states she has appt Wed.

## 2021-04-01 ENCOUNTER — Ambulatory Visit (INDEPENDENT_AMBULATORY_CARE_PROVIDER_SITE_OTHER): Payer: BC Managed Care – PPO | Admitting: Obstetrics & Gynecology

## 2021-04-01 ENCOUNTER — Encounter: Payer: BC Managed Care – PPO | Admitting: Obstetrics & Gynecology

## 2021-04-01 ENCOUNTER — Other Ambulatory Visit: Payer: Self-pay

## 2021-04-01 ENCOUNTER — Encounter: Payer: Self-pay | Admitting: Obstetrics & Gynecology

## 2021-04-01 ENCOUNTER — Telehealth: Payer: Self-pay | Admitting: Obstetrics & Gynecology

## 2021-04-01 VITALS — BP 120/80 | Ht 63.0 in | Wt 206.0 lb

## 2021-04-01 DIAGNOSIS — Z98891 History of uterine scar from previous surgery: Secondary | ICD-10-CM

## 2021-04-01 HISTORY — DX: History of uterine scar from previous surgery: Z98.891

## 2021-04-01 MED ORDER — BUTALBITAL-APAP-CAFFEINE 50-325-40 MG PO TABS: 1.0000 | ORAL_TABLET | Freq: Four times a day (QID) | ORAL | 0 refills | Status: DC | PRN

## 2021-04-01 NOTE — Progress Notes (Signed)
  Postoperative Follow-up Patient presents post op from recent Cesarean Section performed for Malpresentation breech, 1 week ago.  Subjective: Patient reports marked improvement in her immediate post op symptoms. Eating a regular diet without difficulty. Pain is controlled with current analgesics. Medications being used: ibuprofen (OTC) and narcotic analgesics including Percocet.  Activity: normal activities of daily living. Patient reports additional symptom's since surgery of appropriate lochia, no signs of depression, and no signs of mastitis.  Objective: BP 120/80   Ht 5\' 3"  (1.6 m)   Wt 206 lb (93.4 kg)   LMP 07/10/2020   BMI 36.49 kg/m  Physical Exam Constitutional:      General: She is not in acute distress.    Appearance: She is well-developed.  Cardiovascular:     Rate and Rhythm: Normal rate.  Pulmonary:     Effort: Pulmonary effort is normal.  Abdominal:     General: There is no distension.     Palpations: Abdomen is soft.     Tenderness: There is no abdominal tenderness.     Comments: Incision Healing Well   Musculoskeletal:        General: Normal range of motion.  Neurological:     Mental Status: She is alert and oriented to person, place, and time.     Cranial Nerves: No cranial nerve deficit.  Skin:    General: Skin is warm and dry.    Assessment: s/p : Cesarean Section for Malpresentation breech progressing well  Plan: Patient has done well after her Cesarean Section with no apparent complications.  I have discussed the post-operative course to date, and the expected progress moving forward.  The patient understands what complications to be concerned about.  I will see the patient in routine follow up, or sooner if needed.    Activity plan: No heavy lifting.13/06/2020  Pelvic rest. She desires  Nexplanon  for postpartum contraception. Cont BP meds for now, anticipate taper off in 4-5 weeks Fioricet for h/a  Marland Kitchen 04/01/2021, 1:49 PM

## 2021-04-01 NOTE — Telephone Encounter (Signed)
Patient coming in on 05/11/2021 with [facility] for 6 wk pp and Nexplanon insertion

## 2021-04-02 ENCOUNTER — Encounter: Payer: BC Managed Care – PPO | Admitting: Advanced Practice Midwife

## 2021-04-10 ENCOUNTER — Encounter: Payer: BC Managed Care – PPO | Admitting: Advanced Practice Midwife

## 2021-04-23 ENCOUNTER — Other Ambulatory Visit: Payer: Self-pay | Admitting: Obstetrics and Gynecology

## 2021-04-29 NOTE — Telephone Encounter (Signed)
Noted. Will order to arrive by apt date/time. 

## 2021-05-11 ENCOUNTER — Other Ambulatory Visit (HOSPITAL_COMMUNITY)
Admission: RE | Admit: 2021-05-11 | Discharge: 2021-05-11 | Disposition: A | Discharge status: Home / Self Care | Payer: BLUE CROSS/BLUE SHIELD | Source: Ambulatory Visit | Attending: Obstetrics & Gynecology | Admitting: Obstetrics & Gynecology

## 2021-05-11 ENCOUNTER — Encounter: Payer: Self-pay | Admitting: Obstetrics & Gynecology

## 2021-05-11 ENCOUNTER — Other Ambulatory Visit: Payer: Self-pay

## 2021-05-11 ENCOUNTER — Ambulatory Visit (INDEPENDENT_AMBULATORY_CARE_PROVIDER_SITE_OTHER): Payer: BLUE CROSS/BLUE SHIELD | Admitting: Obstetrics & Gynecology

## 2021-05-11 DIAGNOSIS — R87610 Atypical squamous cells of undetermined significance on cytologic smear of cervix (ASC-US): Secondary | ICD-10-CM | POA: Insufficient documentation

## 2021-05-11 DIAGNOSIS — Z9889 Other specified postprocedural states: Secondary | ICD-10-CM | POA: Diagnosis present

## 2021-05-11 DIAGNOSIS — Z30017 Encounter for initial prescription of implantable subdermal contraceptive: Secondary | ICD-10-CM | POA: Diagnosis not present

## 2021-05-11 MED ORDER — METOCLOPRAMIDE HCL 10 MG PO TABS: 10.0000 mg | ORAL_TABLET | Freq: Four times a day (QID) | ORAL | 2 refills | Status: DC | PRN

## 2021-05-11 NOTE — Patient Instructions (Signed)
Nexplanon Instructions After Insertion  Keep bandage clean and dry for 24 hours  May use ice/Tylenol/Ibuprofen for soreness or pain  If you develop fever, drainage or increased warmth from incision site-contact office immediately   

## 2021-05-11 NOTE — Progress Notes (Signed)
  OBSTETRICS POSTPARTUM CLINIC PROGRESS NOTE  Subjective:     Autumn Davies is a 30 y.o. [id number] female who presents for a postpartum visit. She is 6 weeks postpartum following a Term pregnancy or Pregnancy complicated by: cHTN and delivery by  CS for breech .  I have fully reviewed the prenatal and intrapartum course. Anesthesia: spinal.  Postpartum course has been complicated by uncomplicated.  Baby is feeding by Bottle and Breast. Low milk supply of concern. Bleeding: patient has not  resumed menses.  Bowel function is normal. Bladder function is normal.  Patient is not sexually active. Contraception method desired is Nexplanon.  Postpartum depression screening: negative. Edinburgh 6.  The following portions of the patient's history were reviewed and updated as appropriate: allergies, current medications, past family history, past medical history, past social history, past surgical history, and problem list.  Review of Systems Pertinent items are noted in HPI.  Objective:    BP 120/80   Ht 5\' 3"  (1.6 m)   Wt 204 lb (92.5 kg)   BMI 36.14 kg/m   General:  alert and no distress   Breasts:  inspection negative, no nipple discharge or bleeding, no masses or nodularity palpable  Lungs: clear to auscultation bilaterally  Heart:  regular rate and rhythm, S1, S2 normal, no murmur, click, rub or gallop  Abdomen: soft, non-tender; bowel sounds normal; no masses,  no organomegaly.  Well healed Pfannenstiel incision   Vulva:  normal  Vagina: normal vagina, no discharge, exudate, lesion, or erythema  Cervix:  no cervical motion tenderness and no lesions  Corpus: normal size, contour, position, consistency, mobility, non-tender  Adnexa:  normal adnexa and no mass, fullness, tenderness  Rectal Exam: Not performed.          Assessment:  Post Partum Care visit 1. Postpartum exam 2. ASCUS of cervix with negative high risk HPV 3. Prior LEEP - Cytology - PAP  Plan:  See orders and  Patient Instructions Resume all normal activities Follow up in:  12  months or as needed.  Pt has had prior Nexplanon w success. Will stop Labetalol; monitor for s/sx HTN return Reglan for breast milk stimulation  Nexplanon Insertion  Patient given informed consent, signed copy in the chart, time out was performed. Appropriate time out taken.  Patient's Left arm was prepped and draped in the usual sterile fashion.. The ruler used to measure and mark insertion area.  Pt was prepped with betadine swab and then injected with 3 cc of 2% lidocaine with epinephrine. Nexplanon removed form packaging,  Device confirmed in needle, then inserted full length of needle and withdrawn per handbook instructions.  Pt insertion site covered with steri-strip and a bandage.   Minimal blood loss.  Pt tolerated the procedure well.   , MD, Annamarie Major Ob/Gyn, [facility] 05/11/2021  2:17 PM

## 2021-05-15 LAB — CYTOLOGY - PAP: Diagnosis: NEGATIVE

## 2021-05-30 ENCOUNTER — Other Ambulatory Visit: Payer: Self-pay | Admitting: Obstetrics and Gynecology

## 2021-06-15 ENCOUNTER — Other Ambulatory Visit: Payer: Self-pay | Admitting: Obstetrics & Gynecology

## 2021-06-22 NOTE — Telephone Encounter (Signed)
Nexplanon rcvd/charged 05/11/21

## 2021-08-30 HISTORY — PX: CHOLECYSTECTOMY: SHX55

## 2021-11-28 ENCOUNTER — Ambulatory Visit
Admission: RE | Admit: 2021-11-28 | Discharge: 2021-11-28 | Disposition: A | Discharge status: Home / Self Care | Payer: BC Managed Care – PPO | Source: Ambulatory Visit

## 2021-11-28 VITALS — BP 115/79 | HR 93 | Temp 99.0°F | Resp 18

## 2021-11-28 DIAGNOSIS — J029 Acute pharyngitis, unspecified: Secondary | ICD-10-CM | POA: Diagnosis not present

## 2021-11-28 LAB — POCT RAPID STREP A (OFFICE): Rapid Strep A Screen: NEGATIVE

## 2021-11-28 MED ORDER — AMOXICILLIN 875 MG PO TABS: 875.0000 mg | ORAL_TABLET | Freq: Two times a day (BID) | ORAL | 0 refills | Status: DC

## 2021-11-28 NOTE — ED Provider Notes (Signed)
?[facility] ? ? ? ?CSN: [id number] ?Arrival date & time: 11/28/21  1023 ? ? ?  ? ?History   ?Chief Complaint ?Chief Complaint  ?Patient presents with  ? Sore Throat  ?  Entered by patient  ? ? ?HPI ?Autumn Davies is a 31 y.o. female.  ? ?HPI ?Patient presents with a 2 day history of sore throat . ?Denies any associated symptoms.  Patient is having significant difficulty swallowing.  She denies any known sick contacts.  No recent URI illness. ? ?Past Medical History:  ?Diagnosis Date  ? Anxiety   ? Charcot-Marie disease   ? AFFECTS MOSTLY HER LEGS-CAUSES CRAMPS AND RESTLESS LEG-TAKES GABAPENTIN AT NIGHT WHICH SEEMS TO HELP  ? Endometriosis determined by laparoscopy   ? Migraines   ? ? ?Patient Active Problem List  ? Diagnosis Date Noted  ? S/P cesarean section 04/01/2021  ? Elevated blood pressure complicating pregnancy, antepartum, third trimester 03/25/2021  ? Elevated blood pressure affecting pregnancy in third trimester, antepartum 03/25/2021  ? Preeclampsia 03/25/2021  ? Headache in pregnancy, third trimester 03/12/2021  ? Vaginal bleeding in pregnancy, third trimester 02/15/2021  ? CIN III (cervical intraepithelial neoplasia grade III) with severe dysplasia 03/18/2020  ? Irritable bowel syndrome with both constipation and diarrhea 12/07/2019  ? Headache disorder 07/19/2019  ? Menometrorrhagia 01/12/2018  ? Chronic insomnia 08/15/2017  ? Endometriosis determined by laparoscopy   ? Pelvic pain in female 12/25/2015  ? 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 ? ?Past Surgical History:  ?Procedure Laterality Date  ? CESAREAN SECTION  03/25/2021  ? Procedure: CESAREAN SECTION;  Surgeon: Nadara Mustard, MD;  Location: [facility] ORS;  Service: Obstetrics;;  ? LAPAROSCOPY N/A 12/25/2015  ? Procedure: LAPAROSCOPY DIAGNOSTIC;  Surgeon: Conard Novak, MD;  Location: [facility] ORS;  Service: Gynecology;  Laterality: N/A;  ? LEEP N/A 03/18/2020  ? Procedure: LOOP ELECTROSURGICAL EXCISION PROCEDURE (LEEP);   Surgeon: Conard Novak, MD;  Location: [facility] ORS;  Service: Gynecology;  Laterality: N/A;  ? ? ?OB History   ? ? Gravida  ?3  ? Para  ?2  ? Term  ?0  ? Preterm  ?2  ? AB  ?1  ? Living  ?2  ?  ? ? SAB  ?0  ? IAB  ?0  ? Ectopic  ?0  ? Multiple  ?0  ? Live Births  ?2  ?   ?  ?  ? ? ? ?Home Medications   ? ?Prior to Admission medications   ?Medication Sig Start Date End Date Taking? Authorizing Provider  ?FLUoxetine (PROZAC) 10 MG capsule Take by mouth. 11/26/21 11/26/22 Yes [provider]  ?gabapentin (NEURONTIN) 300 MG capsule Take 1 capsule by mouth 3 (three) times daily. 02/09/21   [provider]  ?metoCLOPramide (REGLAN) 10 MG tablet TAKE 1 TABLET(10 MG) BY MOUTH FOUR TIMES DAILY MILK AS NEEDED 06/15/21   Nadara Mustard, MD  ?nortriptyline (PAMELOR) 10 MG capsule Take 10 mg by mouth 2 (two) times daily. 08/18/21   [provider]  ?doxepin (SINEQUAN) 10 MG capsule  01/05/18 01/20/20  [provider]  ?etonogestrel (NEXPLANON) 68 MG IMPL implant 1 each by Subdermal route once.  01/20/20  [provider]  ?prochlorperazine (COMPAZINE) 10 MG tablet TAKE 1 TABLET(10 MG) BY MOUTH EVERY 6 HOURS AS NEEDED FOR NAUSEA OR HEADACHE 03/23/21 03/25/21  Vena Austria, MD  ?progesterone (PROMETRIUM) 200 MG capsule Place 1 capsule (200 mg total) vaginally daily. 11/26/20 03/25/21  Jean Rosenthal,  Estill Bamberg, MD  ? ? ?Family History ?Family History  ?Problem Relation Age of Onset  ? Charcot-Marie-Tooth disease Father   ? Charcot-Marie-Tooth disease Sister   ? Heart disease Maternal Grandmother   ? Hyperlipidemia Maternal Grandmother   ? Heart disease Maternal Grandfather   ? ? ?Social History ?Social History  ? ?Tobacco Use  ? Smoking status: Never  ? Smokeless tobacco: Never  ?Vaping Use  ? Vaping Use: Never used  ?Substance Use Topics  ? Alcohol use: No  ? Drug use: No  ? ? ? ?Allergies   ?Citalopram, Sulfa antibiotics, and Sulfamethoxazole-trimethoprim ? ? ?Review of Systems ?Review of  Systems ?Pertinent negatives listed in HPI  ? ?Physical Exam ?Triage Vital Signs ?ED Triage Vitals  ?Enc Vitals Group  ?   BP 11/28/21 1048 115/79  ?   Pulse Rate 11/28/21 1048 93  ?   Resp 11/28/21 1048 18  ?   Temp 11/28/21 1048 99 ?F (37.2 ?C)  ?   Temp Source 11/28/21 1048 Oral  ?   SpO2 11/28/21 1048 97 %  ?   Weight --   ?   Height --   ?   Head Circumference --   ?   Peak Flow --   ?   Pain Score 11/28/21 1046 7  ?   Pain Loc --   ?   Pain Edu? --   ?   Excl. in [place]? --   ? ?No data found. ? ?Updated Vital Signs ?BP 115/79 (BP Location: Left Arm)   Pulse 93   Temp 99 ?F (37.2 ?C) (Oral)   Resp 18   SpO2 97%   Breastfeeding No  ? ?Visual Acuity ?Right Eye Distance:   ?Left Eye Distance:   ?Bilateral Distance:   ? ?Right Eye Near:   ?Left Eye Near:    ?Bilateral Near:    ? ?Physical Exam ? ?General Appearance:    Alert, cooperative, no distress  ?HENT: Normocephalic, nares patent, TM normal, neck has bilateral anterior cervical nodes enlarged, pharynx erythematous without exudate, and tonsils red, enlarged, with exudate present  ?Eyes:    PERRL, conjunctiva/corneas clear, EOM's intact       ?Lungs:     Clear to auscultation bilaterally, respirations unlabored  ?Heart:    Regular rate and rhythm  ?Neurologic:   Awake, alert, oriented x 3. No apparent focal neurological           defect.   ?   ?  ?UC Treatments / Results  ?Labs ?(all labs ordered are listed, but only abnormal results are displayed) ?Labs Reviewed  ?POCT RAPID STREP A (OFFICE)  ? ? ?EKG ? ? ?Radiology ?No results found. ? ?Procedures ?Procedures (including critical care time) ? ?Medications Ordered in UC ?Medications - No data to display ? ?Initial Impression / Assessment and Plan / UC Course  ?I have reviewed the triage vital signs and the nursing notes. ? ?Pertinent labs & imaging results that were available during my care of the patient were reviewed by me and considered in my medical decision making (see chart for details). ? ?  ?Treating  for acute pharyngitis based on appearance on exam of oropharyngeal region suspect possible strep although rapid strep is negative. ?Treating with amoxicillin.  Encouraged ibuprofen and Tylenol for management of pain and any fever that may develop.  Strict return precautions given if symptoms worsen or do not readily improve. ?Final Clinical Impressions(s) / UC Diagnoses  ? ?Final diagnoses:  ?  Acute pharyngitis, unspecified etiology  ? ?Discharge Instructions   ?None ?  ? ?ED Prescriptions   ? ? Medication Sig Dispense Auth. Provider  ? amoxicillin (AMOXIL) 875 MG tablet Take 1 tablet (875 mg total) by mouth 2 (two) times daily. 20 tablet Scot Jun, FNP  ? ?  ? ?PDMP not reviewed this encounter. ?  ?Scot Jun, FNP ?11/29/21 1008 ? ?

## 2021-11-28 NOTE — ED Triage Notes (Signed)
Pt presents with a ST hurts to swallow since yesterday.  ?

## 2021-12-29 ENCOUNTER — Other Ambulatory Visit: Payer: Self-pay | Admitting: Gastroenterology

## 2021-12-29 DIAGNOSIS — R1013 Epigastric pain: Secondary | ICD-10-CM

## 2022-01-06 ENCOUNTER — Ambulatory Visit
Admission: RE | Admit: 2022-01-06 | Discharge: 2022-01-06 | Disposition: A | Discharge status: Home / Self Care | Payer: BC Managed Care – PPO | Source: Ambulatory Visit | Attending: Gastroenterology | Admitting: Gastroenterology

## 2022-01-06 DIAGNOSIS — R1013 Epigastric pain: Secondary | ICD-10-CM | POA: Diagnosis present

## 2022-02-02 ENCOUNTER — Ambulatory Visit: Payer: Self-pay | Admitting: General Surgery

## 2022-02-02 NOTE — H&P (Signed)
PATIENT PROFILE: Autumn Davies is a 31 y.o. female who presents to the Clinic for consultation at the request of [facility] for evaluation of cholelithiasis.   PCP:  Jose-Mathews, Jessnie, MD   HISTORY OF PRESENT ILLNESS: Autumn Davies reports she has been having abdominal pain for the last few month.  The pain is usually located in the epigastric area.  The pain can radiates to her back.  The pain is getting more frequent.  The pain is now exacerbated by every meal.  There has been no alleviating factors.   Patient has had extensive work-up with gastroenterology.  She has had previous endoscopy which was negative.  I have evaluated the images of the endoscopy.  She has been treated with Prilosec, Pepcid, cervical fate, among others without improvement of her pain.  She had an initial abdominal ultrasound few years ago without finding of cholelithiasis.  She had a recent abdominal ultrasound on 01/06/2022 with finding of cholelithiasis without sign of cholecystitis.  I personally evaluated the images.  Last evaluation by gastroenterology they did not find any indication for repeating the upper endoscopy.  I personally evaluated the chart and her last evaluation.   Patient has history of IBS.  She endorses that her IBS change from constipation to diarrhea.   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    Weakness 05/29/2014    Depression 12/20/2013      GENERAL REVIEW OF SYSTEMS:    General  ROS: negative for - chills, fatigue, fever, weight gain or weight loss Allergy and Immunology ROS: negative for - hives  Hematological and Lymphatic ROS: negative for - bleeding problems or bruising, negative for palpable nodes Endocrine ROS: negative for - heat or cold intolerance, hair changes Respiratory ROS: negative for - cough, shortness of breath or wheezing Cardiovascular ROS: no chest pain or palpitations GI ROS: negative for nausea, vomiting, positive for abdominal pain, diarrhea, constipation Musculoskeletal ROS: negative for - joint swelling or muscle pain Neurological ROS: negative for - confusion, syncope Dermatological ROS: negative for pruritus and rash Psychiatric: negative for anxiety, depression, difficulty sleeping and memory loss   MEDICATIONS: Current Medications        Current Outpatient Medications  Medication Sig Dispense Refill   gabapentin (NEURONTIN) 300 MG capsule Take $RemoveBefo'1200mg'[id number]$  nightly. 120 capsule 4   nortriptyline (PAMELOR) 10 MG capsule TAKE 1 CAPSULE BY MOUTH NIGHTLY FOR 1 WEEK, THEN INCREASE TO 2 CAPSULE BY MOUTH NIGHTLY 60 capsule 0   PARoxetine (PAXIL) 10 MG tablet Take 1 tablet (10 mg total) by mouth once daily 30 tablet 0   phentermine (ADIPEX-P) 15 MG capsule Take 1 capsule (15 mg total) by mouth every morning before breakfast for 30 days 30 capsule 0    No current facility-administered medications for this visit.        ALLERGIES: Celexa [citalopram] and Sulfa (sulfonamide antibiotics)   PAST  MEDICAL HISTORY:     Past Medical History:  Diagnosis Date   Charcot-Marie-Tooth disease     Constipation     Depression     Preeclampsia 12/19/11    with induced vaginal delivery   Rectal pain     Restless legs        PAST SURGICAL HISTORY:      Past Surgical History:  Procedure Laterality Date   COLONOSCOPY   11/15/12    per Dr. Phylis Bougie, results pending   EGD   11/15/12    per Dr. Phylis Bougie, results pending   EGD   04/03/2018    Normal EGD /No  Repeat/TKT   COLONOSCOPY   06/19/2019    Negative colon biopsies/Repeat at age 49/ in 67yrs/TKT   COLONOSCOPY   2014 ???    [facility] - nml per pt.   UPPER GASTROINTESTINAL ENDOSCOPY   2014 ??    [facility] per patient      FAMILY HISTORY:      Family History  Problem Relation Age of Onset   High blood pressure (Hypertension) Maternal Grandfather     Coronary Artery Disease (Blocked arteries around heart) Maternal Grandfather     Diabetes type II Maternal Grandfather     Diabetes type II Paternal Grandmother     Charcot-Marie-Tooth disease Father     Colon polyps Maternal Grandmother     Colon cancer Sister        SOCIAL HISTORY: Social History           Socioeconomic History   Marital status: Married  Tobacco Use   Smoking status: Former      Packs/day: 0.25      Years: 0.50      Pack years: 0.13      Types: Cigarettes      Quit date: 04/14/2011      Years since quitting: 10.8   Smokeless tobacco: Never   Tobacco comments:      Found out I was pregnant not long after I started smoking and haven't smoked since.  Vaping Use   Vaping Use: Never used  Substance and Sexual Activity   Alcohol use: No   Drug use: No   Sexual activity: Yes      Partners: Male      Birth control/protection: Implant  Social History Narrative    Single, lives with parents and daughter, Terrence Dupont, born 2013. High school graduate. First semester at [employer]. No currently working. No exercise.         PHYSICAL EXAM:    Vitals:    01/28/22 1539  BP: (!) 140/92  Pulse: 95    Body mass index is 38.04 kg/m. Weight: 94.3 kg (208 lb)    GENERAL: Alert, active, oriented x3   HEENT: Pupils equal reactive to light. Extraocular movements are intact. Sclera clear. Palpebral conjunctiva normal red color.Pharynx clear.   NECK: Supple with no palpable mass and no adenopathy.   LUNGS: Sound clear with no rales rhonchi or wheezes.   HEART: Regular rhythm S1 and S2 without murmur.    ABDOMEN: Soft and depressible, nontender with no palpable mass, no hepatomegaly.    EXTREMITIES: Well-developed well-nourished symmetrical with no dependent edema.   NEUROLOGICAL: Awake alert oriented, facial expression symmetrical, moving all extremities.   REVIEW OF DATA: I have reviewed the following data today:      Appointment on 12/30/2021  Component Date Value   WBC (White Blood Cell Co* 12/30/2021 11.4 (H)  RBC (Red Blood Cell Coun* 12/30/2021 4.94    Hemoglobin 12/30/2021 14.1    Hematocrit 12/30/2021 43.6    MCV (Mean Corpuscular Vo* 12/30/2021 88.3    MCH (Mean Corpuscular He* 12/30/2021 28.5    MCHC (Mean Corpuscular H* 12/30/2021 32.3    Platelet Count 12/30/2021 310    RDW-CV (Red Cell Distrib* 12/30/2021 14.6    MPV (Mean Platelet Volum* 12/30/2021 10.6    Neutrophils 12/30/2021 6.78    Lymphocytes 12/30/2021 3.27    Monocytes 12/30/2021 1.07    Eosinophils 12/30/2021 0.15    Basophils 12/30/2021 0.13 (H)    Neutrophil % 12/30/2021 59.4    Lymphocyte % 12/30/2021 28.6    Monocyte % 12/30/2021 9.4    Eosinophil % 12/30/2021 1.3    Basophil% 12/30/2021 1.1    Immature Granulocyte % 12/30/2021 0.2    Immature Granulocyte Cou* 12/30/2021 0.02    Glucose 12/30/2021 94    Sodium 12/30/2021 140    Potassium 12/30/2021 4.1    Chloride 12/30/2021 107    Carbon Dioxide (CO2) 12/30/2021 26.2    Urea Nitrogen (BUN) 12/30/2021 8    Creatinine 12/30/2021 0.6    Glomerular Filtration Ra* 12/30/2021 119    Calcium 12/30/2021 9.2    AST  12/30/2021 21    ALT  12/30/2021 36    Alk Phos (alkaline Phosp* 12/30/2021 73    Albumin 12/30/2021 4.1    Bilirubin, Total 12/30/2021 0.8    Protein, Total 12/30/2021 7.1    A/G Ratio 12/30/2021 1.4    H Pylori IgG - [facility] 12/30/2021 0.14       ASSESSMENT: Autumn Davies is a 31 y.o. female presenting for consultation for cholelithiasis.     Patient was oriented about the diagnosis of cholelithiasis. Also oriented about what  is the gallbladder, its anatomy and function and the implications of having stones. The patient was oriented about the treatment alternatives (observation vs cholecystectomy).  I discussed with patient that abdominal pain is multifactorial.  Due to the patient history of IBS she could continue having different type of abdominal pain.  Patient was oriented that a low percentage of patient will continue to have similar pain symptoms even after the gallbladder is removed. Surgical technique (open vs laparoscopic) was discussed. It was also discussed the goals of the surgery (decrease the pain episodes and avoid the risk of cholecystitis) and the risk of surgery including: bleeding, infection, common bile duct injury, stone retention, injury to other organs such as bowel, liver, stomach, other complications such as hernia, bowel obstruction among others. Also discussed with patient about anesthesia and its complications such as: reaction to medications, pneumonia, heart complications, death, among others.    Cholelithiasis without cholecystitis [K80.20]   PLAN: 1.  Robotic assisted laparoscopic cholecystectomy (41423) 2.  CBC, CMP done 3.  Do not take aspirin 5 days before the procedure 5.  Contact us if has any question or concern.    Patient verbalized understanding, all questions were answered, and were agreeable with the plan outlined above.        Herbert Pun, MD   Electronically signed by Herbert Pun, MD

## 2022-02-23 ENCOUNTER — Encounter
Admission: RE | Admit: 2022-02-23 | Discharge: 2022-02-23 | Disposition: A | Discharge status: Home / Self Care | Payer: BC Managed Care – PPO | Source: Ambulatory Visit | Attending: General Surgery | Admitting: General Surgery

## 2022-02-23 VITALS — Ht 63.0 in | Wt 200.0 lb

## 2022-02-23 DIAGNOSIS — Z01812 Encounter for preprocedural laboratory examination: Secondary | ICD-10-CM

## 2022-02-23 HISTORY — DX: Unspecified pre-eclampsia, unspecified trimester: O14.90

## 2022-02-23 HISTORY — DX: Major depressive disorder, single episode, unspecified: F32.9

## 2022-02-23 HISTORY — DX: Irritable bowel syndrome, unspecified: K58.9

## 2022-03-04 MED ORDER — CEFAZOLIN SODIUM-DEXTROSE 2-4 GM/100ML-% IV SOLN: 2.0000 g | INTRAVENOUS | Status: DC

## 2022-03-04 MED ORDER — INDOCYANINE GREEN 25 MG IV SOLR
1.2500 mg | Freq: Once | INTRAVENOUS | Status: AC
  Administered 2022-03-05: 1.25 mg via INTRAVENOUS
  Filled 2022-03-04: qty 0.5

## 2022-03-04 MED ORDER — LACTATED RINGERS IV SOLN: INTRAVENOUS | Status: DC

## 2022-03-04 MED ORDER — FAMOTIDINE 20 MG PO TABS: 20.0000 mg | ORAL_TABLET | Freq: Once | ORAL | Status: AC

## 2022-03-04 MED ORDER — ORAL CARE MOUTH RINSE: 15.0000 mL | Freq: Once | OROMUCOSAL | Status: AC

## 2022-03-04 MED ORDER — CHLORHEXIDINE GLUCONATE 0.12 % MT SOLN: 15.0000 mL | Freq: Once | OROMUCOSAL | Status: AC

## 2022-03-05 ENCOUNTER — Encounter: Payer: Self-pay | Admitting: General Surgery

## 2022-03-05 ENCOUNTER — Ambulatory Visit: Payer: BC Managed Care – PPO | Admitting: Registered Nurse

## 2022-03-05 ENCOUNTER — Encounter
Admission: RE | Disposition: A | Discharge status: Home / Self Care | Payer: Self-pay | Source: Home / Self Care | Attending: General Surgery

## 2022-03-05 ENCOUNTER — Ambulatory Visit
Admission: RE | Admit: 2022-03-05 | Discharge: 2022-03-05 | Disposition: A | Discharge status: Home / Self Care | Payer: BC Managed Care – PPO | Attending: General Surgery | Admitting: General Surgery

## 2022-03-05 DIAGNOSIS — Z01812 Encounter for preprocedural laboratory examination: Secondary | ICD-10-CM

## 2022-03-05 DIAGNOSIS — K582 Mixed irritable bowel syndrome: Secondary | ICD-10-CM | POA: Insufficient documentation

## 2022-03-05 DIAGNOSIS — G600 Hereditary motor and sensory neuropathy: Secondary | ICD-10-CM | POA: Insufficient documentation

## 2022-03-05 DIAGNOSIS — Z87891 Personal history of nicotine dependence: Secondary | ICD-10-CM | POA: Insufficient documentation

## 2022-03-05 DIAGNOSIS — K8010 Calculus of gallbladder with chronic cholecystitis without obstruction: Secondary | ICD-10-CM | POA: Diagnosis not present

## 2022-03-05 DIAGNOSIS — K8020 Calculus of gallbladder without cholecystitis without obstruction: Secondary | ICD-10-CM | POA: Diagnosis present

## 2022-03-05 DIAGNOSIS — F419 Anxiety disorder, unspecified: Secondary | ICD-10-CM | POA: Insufficient documentation

## 2022-03-05 DIAGNOSIS — F329 Major depressive disorder, single episode, unspecified: Secondary | ICD-10-CM | POA: Diagnosis not present

## 2022-03-05 LAB — POCT PREGNANCY, URINE: Preg Test, Ur: NEGATIVE

## 2022-03-05 SURGERY — CHOLECYSTECTOMY, ROBOT-ASSISTED, LAPAROSCOPIC
Anesthesia: General | Site: Abdomen

## 2022-03-05 MED ORDER — CHLORHEXIDINE GLUCONATE 0.12 % MT SOLN
OROMUCOSAL | Status: AC
  Administered 2022-03-05: 15 mL via OROMUCOSAL
  Filled 2022-03-05: qty 15

## 2022-03-05 MED ORDER — FIBRIN SEALANT 2 ML SINGLE DOSE KIT
PACK | CUTANEOUS | Status: DC | PRN
  Administered 2022-03-05: 2 mL via TOPICAL

## 2022-03-05 MED ORDER — CEFAZOLIN SODIUM-DEXTROSE 2-4 GM/100ML-% IV SOLN
INTRAVENOUS | Status: AC
  Filled 2022-03-05: qty 100

## 2022-03-05 MED ORDER — FENTANYL CITRATE (PF) 100 MCG/2ML IJ SOLN
INTRAMUSCULAR | Status: DC | PRN
  Administered 2022-03-05 (×2): 50 ug via INTRAVENOUS

## 2022-03-05 MED ORDER — HYDROCODONE-ACETAMINOPHEN 5-325 MG PO TABS: 1.0000 | ORAL_TABLET | ORAL | 0 refills | Status: AC | PRN

## 2022-03-05 MED ORDER — DEXAMETHASONE SODIUM PHOSPHATE 10 MG/ML IJ SOLN
INTRAMUSCULAR | Status: DC | PRN
  Administered 2022-03-05: 10 mg via INTRAVENOUS

## 2022-03-05 MED ORDER — FENTANYL CITRATE (PF) 100 MCG/2ML IJ SOLN
INTRAMUSCULAR | Status: AC
  Filled 2022-03-05: qty 2

## 2022-03-05 MED ORDER — FENTANYL CITRATE (PF) 100 MCG/2ML IJ SOLN
25.0000 ug | INTRAMUSCULAR | Status: DC | PRN
  Administered 2022-03-05 (×3): 50 ug via INTRAVENOUS

## 2022-03-05 MED ORDER — ACETAMINOPHEN 10 MG/ML IV SOLN
INTRAVENOUS | Status: DC | PRN
  Administered 2022-03-05: 1000 mg via INTRAVENOUS

## 2022-03-05 MED ORDER — OXYCODONE HCL 5 MG PO TABS
ORAL_TABLET | ORAL | Status: AC
  Administered 2022-03-05: 5 mg
  Filled 2022-03-05: qty 1

## 2022-03-05 MED ORDER — ACETAMINOPHEN 10 MG/ML IV SOLN
INTRAVENOUS | Status: AC
Start: 2022-03-05 — End: ?
  Filled 2022-03-05: qty 100

## 2022-03-05 MED ORDER — FENTANYL CITRATE (PF) 100 MCG/2ML IJ SOLN
50.0000 ug | Freq: Once | INTRAMUSCULAR | Status: AC
  Administered 2022-03-05: 50 ug via INTRAVENOUS

## 2022-03-05 MED ORDER — PROPOFOL 10 MG/ML IV BOLUS
INTRAVENOUS | Status: DC | PRN
  Administered 2022-03-05: 200 mg via INTRAVENOUS

## 2022-03-05 MED ORDER — ONDANSETRON HCL 4 MG/2ML IJ SOLN
INTRAMUSCULAR | Status: AC
Start: 2022-03-05 — End: ?
  Filled 2022-03-05: qty 2

## 2022-03-05 MED ORDER — PROMETHAZINE HCL 25 MG/ML IJ SOLN: 6.2500 mg | INTRAMUSCULAR | Status: DC | PRN

## 2022-03-05 MED ORDER — PROPOFOL 10 MG/ML IV BOLUS
INTRAVENOUS | Status: AC
Start: 2022-03-05 — End: ?
  Filled 2022-03-05: qty 40

## 2022-03-05 MED ORDER — MIDAZOLAM HCL 2 MG/2ML IJ SOLN
INTRAMUSCULAR | Status: AC
  Filled 2022-03-05: qty 2

## 2022-03-05 MED ORDER — ONDANSETRON HCL 4 MG/2ML IJ SOLN
INTRAMUSCULAR | Status: DC | PRN
  Administered 2022-03-05: 4 mg via INTRAVENOUS

## 2022-03-05 MED ORDER — DEXAMETHASONE SODIUM PHOSPHATE 10 MG/ML IJ SOLN
INTRAMUSCULAR | Status: AC
  Filled 2022-03-05: qty 1

## 2022-03-05 MED ORDER — LIDOCAINE HCL (CARDIAC) PF 100 MG/5ML IV SOSY
PREFILLED_SYRINGE | INTRAVENOUS | Status: DC | PRN
  Administered 2022-03-05: 50 mg via INTRAVENOUS

## 2022-03-05 MED ORDER — LIDOCAINE HCL (PF) 2 % IJ SOLN
INTRAMUSCULAR | Status: AC
  Filled 2022-03-05: qty 5

## 2022-03-05 MED ORDER — BUPIVACAINE-EPINEPHRINE (PF) 0.25% -1:200000 IJ SOLN
INTRAMUSCULAR | Status: AC
  Filled 2022-03-05: qty 30

## 2022-03-05 MED ORDER — ROCURONIUM BROMIDE 100 MG/10ML IV SOLN
INTRAVENOUS | Status: DC | PRN
  Administered 2022-03-05: 50 mg via INTRAVENOUS

## 2022-03-05 MED ORDER — BUPIVACAINE-EPINEPHRINE 0.25% -1:200000 IJ SOLN
INTRAMUSCULAR | Status: DC | PRN
  Administered 2022-03-05: 30 mL

## 2022-03-05 MED ORDER — MIDAZOLAM HCL 2 MG/2ML IJ SOLN
INTRAMUSCULAR | Status: DC | PRN
  Administered 2022-03-05: 2 mg via INTRAVENOUS

## 2022-03-05 MED ORDER — FAMOTIDINE 20 MG PO TABS
ORAL_TABLET | ORAL | Status: AC
  Administered 2022-03-05: 20 mg via ORAL
  Filled 2022-03-05: qty 1

## 2022-03-05 MED ORDER — ROCURONIUM BROMIDE 10 MG/ML (PF) SYRINGE
PREFILLED_SYRINGE | INTRAVENOUS | Status: AC
  Filled 2022-03-05: qty 10

## 2022-03-05 MED ORDER — KETOROLAC TROMETHAMINE 30 MG/ML IJ SOLN
INTRAMUSCULAR | Status: AC
  Filled 2022-03-05: qty 1

## 2022-03-05 MED ORDER — SUGAMMADEX SODIUM 200 MG/2ML IV SOLN
INTRAVENOUS | Status: DC | PRN
  Administered 2022-03-05: 200 mg via INTRAVENOUS

## 2022-03-05 MED ORDER — KETOROLAC TROMETHAMINE 30 MG/ML IJ SOLN
INTRAMUSCULAR | Status: DC | PRN
  Administered 2022-03-05: 30 mg via INTRAVENOUS

## 2022-03-05 SURGICAL SUPPLY — 59 items
ADH SKN CLS APL DERMABOND .7 (GAUZE/BANDAGES/DRESSINGS) ×2
APL LAPSCP 35 DL APL RGD (MISCELLANEOUS) ×2
APPLICATOR VISTASEAL 35 (MISCELLANEOUS) ×1 IMPLANT
BAG PRESSURE INF REUSE 1000 (BAG) IMPLANT
BLADE SURG SZ11 CARB STEEL (BLADE) ×3 IMPLANT
CANNULA REDUC XI 12-8 STAPL (CANNULA) ×3
CANNULA REDUCER 12-8 DVNC XI (CANNULA) ×2 IMPLANT
CATH REDDICK CHOLANGI 4FR 50CM (CATHETERS) IMPLANT
CLIP LIGATING HEM O LOK PURPLE (MISCELLANEOUS) IMPLANT
CLIP LIGATING HEMO O LOK GREEN (MISCELLANEOUS) ×3 IMPLANT
DERMABOND ADVANCED (GAUZE/BANDAGES/DRESSINGS) ×3
DERMABOND ADVANCED .7 DNX12 (GAUZE/BANDAGES/DRESSINGS) ×2 IMPLANT
DRAPE ARM DVNC X/XI (DISPOSABLE) ×8 IMPLANT
DRAPE C-ARM XRAY 36X54 (DRAPES) IMPLANT
DRAPE COLUMN DVNC XI (DISPOSABLE) ×2 IMPLANT
DRAPE DA VINCI XI ARM (DISPOSABLE) ×12
DRAPE DA VINCI XI COLUMN (DISPOSABLE) ×3
ELECT REM PT RETURN 9FT ADLT (ELECTROSURGICAL) ×3
ELECTRODE REM PT RTRN 9FT ADLT (ELECTROSURGICAL) ×2 IMPLANT
GLOVE BIO SURGEON STRL SZ 6.5 (GLOVE) ×8 IMPLANT
GLOVE BIOGEL PI IND STRL 6.5 (GLOVE) ×4 IMPLANT
GLOVE BIOGEL PI INDICATOR 6.5 (GLOVE) ×12
GOWN STRL REUS W/ TWL LRG LVL3 (GOWN DISPOSABLE) ×6 IMPLANT
GOWN STRL REUS W/TWL LRG LVL3 (GOWN DISPOSABLE) ×9
GRASPER SUT TROCAR 14GX15 (MISCELLANEOUS) ×3 IMPLANT
IRRIGATOR SUCT 8 DISP DVNC XI (IRRIGATION / IRRIGATOR) IMPLANT
IRRIGATOR SUCTION 8MM XI DISP (IRRIGATION / IRRIGATOR)
IV CATH ANGIO 12GX3 LT BLUE (NEEDLE) IMPLANT
IV NS 1000ML (IV SOLUTION)
IV NS 1000ML BAXH (IV SOLUTION) IMPLANT
KIT PINK PAD W/HEAD ARE REST (MISCELLANEOUS) ×3
KIT PINK PAD W/HEAD ARM REST (MISCELLANEOUS) ×2 IMPLANT
LABEL OR SOLS (LABEL) ×3 IMPLANT
MANIFOLD NEPTUNE II (INSTRUMENTS) ×3 IMPLANT
NDL INSUFFLATION 14GA 120MM (NEEDLE) ×2 IMPLANT
NEEDLE HYPO 22GX1.5 SAFETY (NEEDLE) ×3 IMPLANT
NEEDLE INSUFFLATION 14GA 120MM (NEEDLE) ×3 IMPLANT
NS IRRIG 500ML POUR BTL (IV SOLUTION) ×3 IMPLANT
OBTURATOR OPTICAL LONG 8 DVNC (TROCAR) IMPLANT
OBTURATOR OPTICAL LONG 8MM (TROCAR) ×3
OBTURATOR OPTICAL STANDARD 8MM (TROCAR) ×3
OBTURATOR OPTICAL STND 8 DVNC (TROCAR) ×2
OBTURATOR OPTICALSTD 8 DVNC (TROCAR) ×2 IMPLANT
PACK LAP CHOLECYSTECTOMY (MISCELLANEOUS) ×3 IMPLANT
SEAL CANN UNIV 5-8 DVNC XI (MISCELLANEOUS) ×6 IMPLANT
SEAL XI 5MM-8MM UNIVERSAL (MISCELLANEOUS) ×9
SET TUBE SMOKE EVAC HIGH FLOW (TUBING) ×3 IMPLANT
SOLUTION ELECTROLUBE (MISCELLANEOUS) ×3 IMPLANT
SPIKE FLUID TRANSFER (MISCELLANEOUS) ×5 IMPLANT
SPONGE T-LAP 4X18 ~~LOC~~+RFID (SPONGE) IMPLANT
STAPLER CANNULA SEAL DVNC XI (STAPLE) ×2 IMPLANT
STAPLER CANNULA SEAL XI (STAPLE) ×3
SUT MNCRL 4-0 (SUTURE) ×3
SUT MNCRL 4-0 27XMFL (SUTURE) ×2
SUT VICRYL 0 AB UR-6 (SUTURE) ×3 IMPLANT
SUTURE MNCRL 4-0 27XMF (SUTURE) ×2 IMPLANT
SYS BAG RETRIEVAL 10MM (BASKET) ×3
SYSTEM BAG RETRIEVAL 10MM (BASKET) ×2 IMPLANT
WATER STERILE IRR 500ML POUR (IV SOLUTION) ×2 IMPLANT

## 2022-03-05 NOTE — Discharge Instructions (Addendum)
  Diet: Resume home heart healthy regular diet.   Activity: No heavy lifting >20 pounds (children, pets, laundry, garbage) or strenuous activity until follow-up, but light activity and walking are encouraged. Do not drive or drink alcohol if taking narcotic pain medications.  Wound care: May shower with soapy water and pat dry (do not rub incisions), but no baths or submerging incision underwater until follow-up. (no swimming)   Medications: Resume all home medications. For mild to moderate pain: acetaminophen (Tylenol) or ibuprofen (if no kidney disease). Combining Tylenol with alcohol can substantially increase your risk of causing liver disease. Narcotic pain medications, if prescribed, can be used for severe pain, though may cause nausea, constipation, and drowsiness. Do not combine Tylenol and Norco within a 6 hour period as Norco contains Tylenol. If you do not need the narcotic pain medication, you do not need to fill the prescription.  Call office [phone]) at any time if any questions, worsening pain, fevers/chills, bleeding, drainage from incision site, or other concerns. AMBULATORY SURGERY  DISCHARGE INSTRUCTIONS   The drugs that you were given will stay in your system until tomorrow so for the next 24 hours you should not:  Drive an automobile Make any legal decisions Drink any alcoholic beverage   You may resume regular meals tomorrow.  Today it is better to start with liquids and gradually work up to solid foods.  You may eat anything you prefer, but it is better to start with liquids, then soup and crackers, and gradually work up to solid foods.   Please notify your doctor immediately if you have any unusual bleeding, trouble breathing, redness and pain at the surgery site, drainage, fever, or pain not relieved by medication.    Additional Instructions:  You last had IV tylenol 1,000 mg @0835  Toradol IV 0843 (NSAID)        Please contact your physician with  any problems or Same Day Surgery at [phone], Monday through Friday 6 am to 4 pm, or [place] at [facility] number at [phone].

## 2022-03-05 NOTE — Anesthesia Procedure Notes (Signed)
Procedure Name: Intubation Date/Time: 03/05/2022 7:40 AM  Performed by: Reece Agar, CRNAPre-anesthesia Checklist: Patient identified, Emergency Drugs available, Suction available and Patient being monitored Patient Re-evaluated:Patient Re-evaluated prior to induction Oxygen Delivery Method: Circle system utilized Preoxygenation: Pre-oxygenation with 100% oxygen Induction Type: IV induction Ventilation: Mask ventilation without difficulty Laryngoscope Size: McGraph and 3 Grade View: Grade I Tube type: Oral Tube size: 7.0 mm Number of attempts: 1 Airway Equipment and Method: Stylet and Oral airway Placement Confirmation: ETT inserted through vocal cords under direct vision, positive ETCO2 and breath sounds checked- equal and bilateral Secured at: 21 cm Tube secured with: Tape Dental Injury: Teeth and Oropharynx as per pre-operative assessment

## 2022-03-05 NOTE — Transfer of Care (Signed)
Immediate Anesthesia Transfer of Care Note  Patient: Autumn Davies  Procedure(s) Performed: XI ROBOTIC ASSISTED LAPAROSCOPIC CHOLECYSTECTOMY (Abdomen) INDOCYANINE GREEN FLUORESCENCE IMAGING (ICG)  Patient Location: PACU  Anesthesia Type:General  Level of Consciousness: awake, drowsy and patient cooperative  Airway & Oxygen Therapy: Patient Spontanous Breathing and Patient connected to face mask oxygen  Post-op Assessment: Report given to RN and Post -op Vital signs reviewed and stable  Post vital signs: Reviewed and stable  Last Vitals:  Vitals Value Taken Time  BP 116/72 03/05/22 0858  Temp    Pulse 80 03/05/22 0859  Resp 13 03/05/22 0859  SpO2 99 % 03/05/22 0859  Vitals shown include unvalidated device data.  Last Pain:  Vitals:   03/05/22 0646  TempSrc: Temporal  PainSc: 0-No pain      Patients Stated Pain Goal: 0 (03/05/22 0646)  Complications: No notable events documented.

## 2022-03-05 NOTE — Op Note (Signed)
Preoperative diagnosis: Cholelithiasis  Postoperative diagnosis: Cholelithiasis  Procedure: Robotic Assisted Laparoscopic Cholecystectomy.   Anesthesia: GETA   Surgeon: Dr. Hazle Quant  Wound Classification: Clean Contaminated  Indications: Patient is a 31 y.o. female developed right upper quadrant pain and on workup was found to have cholelithiasis with a normal common duct. Robotic Assisted Laparoscopic cholecystectomy was elected.  Findings:  Critical view of safety achieved Cystic duct and artery identified, ligated and divided Adequate hemostasis     Description of procedure: The patient was placed on the operating table in the supine position. General anesthesia was induced. A time-out was completed verifying correct patient, procedure, site, positioning, and implant(s) and/or special equipment prior to beginning this procedure. An orogastric tube was placed. The abdomen was prepped and draped in the usual sterile fashion.  An incision was made in a natural skin line below the umbilicus.  The fascia was elevated and the Veress needle inserted. Proper position was confirmed by aspiration and saline meniscus test.  The abdomen was insufflated with carbon dioxide to a pressure of 15 mmHg. The patient tolerated insufflation well. A 8-mm trocar was then inserted in optiview fashion.  The laparoscope was inserted and the abdomen inspected. No injuries from initial trocar placement were noted. Additional trocars were then inserted in the following locations: an 8-mm trocar in the left lateral abdomen, and another two 8-mm trocars to the right side of the abdomen 5 cm appart. The umbilical trocar was changed to a 12 mm trocar all under direct visualization. The abdomen was inspected and no abnormalities were found. The table was placed in the reverse Trendelenburg position with the right side up. The robotic arms were docked and target anatomy identified. Instrument inserted under direct  visualization.  Filmy adhesions between the gallbladder and omentum, duodenum and transverse colon were lysed with electrocautery. The dome of the gallbladder was grasped with a prograsp and retracted over the dome of the liver. The infundibulum was also grasped with an atraumatic grasper and retracted toward the right lower quadrant. This maneuver exposed Calot's triangle. The peritoneum overlying the gallbladder infundibulum was then incised and the cystic duct and cystic artery identified and circumferentially dissected. Critical view of safety reviewed before ligating any structure. Firefly images taken to visualize biliary ducts. The cystic duct and cystic artery were then doubly clipped and divided close to the gallbladder.  The gallbladder was then dissected from its peritoneal attachments by electrocautery. Hemostasis was checked and the gallbladder and contained stones were removed using an endoscopic retrieval bag. The gallbladder was passed off the table as a specimen. There was no evidence of bleeding from the gallbladder fossa or cystic artery or leakage of the bile from the cystic duct stump. Secondary trocars were removed under direct vision. No bleeding was noted. The robotic arms were undoked. The scope was withdrawn and the umbilical trocar removed. The abdomen was allowed to collapse. The fascia of the 12mm trocar sites was closed with figure-of-eight 0 vicryl sutures. The skin was closed with subcuticular sutures of 4-0 monocryl and topical skin adhesive. The orogastric tube was removed.  The patient tolerated the procedure well and was taken to the postanesthesia care unit in stable condition.   Specimen: Gallbladder  Complications: None  EBL: 5 mL

## 2022-03-05 NOTE — Anesthesia Preprocedure Evaluation (Signed)
Anesthesia Evaluation  Patient identified by MRN, date of birth, ID band Patient awake    Reviewed: Allergy & Precautions, H&P , NPO status , Patient's Chart, lab work & pertinent test results, reviewed documented beta blocker date and time   History of Anesthesia Complications Negative for: history of anesthetic complications  Airway Mallampati: IV  TM Distance: >3 FB Neck ROM: full    Dental  (+) Dental Advidsory Given   Pulmonary neg pulmonary ROS, former smoker,    Pulmonary exam normal breath sounds clear to auscultation       Cardiovascular Exercise Tolerance: Good (-) hypertensionnegative cardio ROS Normal cardiovascular exam Rhythm:regular Rate:Normal     Neuro/Psych  Headaches, neg Seizures PSYCHIATRIC DISORDERS Anxiety Depression  Neuromuscular disease (Charcot-Marie Tooth)    GI/Hepatic negative GI ROS, Neg liver ROS,   Endo/Other  negative endocrine ROS  Renal/GU negative Renal ROS  negative genitourinary   Musculoskeletal   Abdominal   Peds  Hematology negative hematology ROS (+)   Anesthesia Other Findings Past Medical History: No date: Anxiety 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     Comment:  AFFECTS MOSTLY HER LEGS-CAUSES CRAMPS AND RESTLESS               LEG-TAKES GABAPENTIN AT NIGHT WHICH SEEMS TO HELP No date: Endometriosis determined by laparoscopy No date: Irritable bowel syndrome No date: Major depressive disorder No date: Migraines No date: Preeclampsia   Reproductive/Obstetrics negative OB ROS                             Anesthesia Physical Anesthesia Plan  ASA: 2  Anesthesia Plan: General   Post-op Pain Management:    Induction: Intravenous  PONV Risk Score and Plan: 3 and Ondansetron, Dexamethasone, Midazolam and Treatment may vary due to age or medical condition  Airway Management Planned: Oral ETT  Additional Equipment:   Intra-op Plan:    Post-operative Plan: Extubation in OR  Informed Consent: I have reviewed the patients History and Physical, chart, labs and discussed the procedure including the risks, benefits and alternatives for the proposed anesthesia with the patient or authorized representative who has indicated his/her understanding and acceptance.     Dental Advisory Given  Plan Discussed with: Anesthesiologist, CRNA and Surgeon  Anesthesia Plan Comments:         Anesthesia Quick Evaluation

## 2022-03-05 NOTE — H&P (Signed)
PATIENT PROFILE: Autumn Davies is a 31 y.o. female who presents to the Clinic for consultation at the request of [facility] for evaluation of cholelithiasis.   PCP:  Jose-Mathews, Jessnie, MD   HISTORY OF PRESENT ILLNESS: Autumn Davies reports she has been having abdominal pain for the last few month.  The pain is usually located in the epigastric area.  The pain can radiates to her back.  The pain is getting more frequent.  The pain is now exacerbated by every meal.  There has been no alleviating factors.   Patient has had extensive work-up with gastroenterology.  She has had previous endoscopy which was negative.  I have evaluated the images of the endoscopy.  She has been treated with Prilosec, Pepcid, cervical fate, among others without improvement of her pain.  She had an initial abdominal ultrasound few years ago without finding of cholelithiasis.  She had a recent abdominal ultrasound on 01/06/2022 with finding of cholelithiasis without sign of cholecystitis.  I personally evaluated the images.  Last evaluation by gastroenterology they did not find any indication for repeating the upper endoscopy.  I personally evaluated the chart and her last evaluation.   Patient has history of IBS.  She endorses that her IBS change from constipation to diarrhea.   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      Weakness 05/29/2014      Depression  12/20/2013        GENERAL REVIEW OF SYSTEMS:    General ROS: negative for - chills, fatigue, fever, weight gain or weight loss Allergy and Immunology ROS: negative for - hives  Hematological and Lymphatic ROS: negative for - bleeding problems or bruising, negative for palpable nodes Endocrine ROS: negative for - heat or cold intolerance, hair changes Respiratory ROS: negative for - cough, shortness of breath or wheezing Cardiovascular ROS: no chest pain or palpitations GI ROS: negative for nausea, vomiting, positive for abdominal pain, diarrhea, constipation Musculoskeletal ROS: negative for - joint swelling or muscle pain Neurological ROS: negative for - confusion, syncope Dermatological ROS: negative for pruritus and rash Psychiatric: negative for anxiety, depression, difficulty sleeping and memory loss   MEDICATIONS: Current Medications             Current Outpatient Medications  Medication Sig Dispense Refill   gabapentin (NEURONTIN) 300 MG capsule Take $RemoveBefo'1200mg'[id number]$  nightly. 120 capsule 4   nortriptyline (PAMELOR) 10 MG capsule TAKE 1 CAPSULE BY MOUTH NIGHTLY FOR 1 WEEK, THEN INCREASE TO 2 CAPSULE BY MOUTH NIGHTLY 60 capsule 0   PARoxetine (PAXIL) 10 MG tablet Take 1 tablet (10 mg total) by mouth once daily  30 tablet 0   phentermine (ADIPEX-P) 15 MG capsule Take 1 capsule (15 mg total) by mouth every morning before breakfast for 30 days 30 capsule 0    No current facility-administered medications for this visit.        ALLERGIES: Celexa [citalopram] and Sulfa (sulfonamide antibiotics)   PAST MEDICAL HISTORY:        Past Medical History:  Diagnosis Date   Charcot-Marie-Tooth disease     Constipation     Depression     Preeclampsia 12/19/11    with induced vaginal delivery   Rectal pain     Restless legs        PAST SURGICAL HISTORY:          Past Surgical History:  Procedure Laterality Date   COLONOSCOPY   11/15/12    per Dr. Phylis Bougie, results pending   EGD   11/15/12     per Dr. Phylis Bougie, results pending   EGD   04/03/2018    Normal EGD /No Repeat/TKT   COLONOSCOPY   06/19/2019    Negative colon biopsies/Repeat at age 42/ in 34yrs/TKT   COLONOSCOPY   2014 ???    [facility] - nml per pt.   UPPER GASTROINTESTINAL ENDOSCOPY   2014 ??    [facility] per patient      FAMILY HISTORY:          Family History  Problem Relation Age of Onset   High blood pressure (Hypertension) Maternal Grandfather     Coronary Artery Disease (Blocked arteries around heart) Maternal Grandfather     Diabetes type II Maternal Grandfather     Diabetes type II Paternal Grandmother     Charcot-Marie-Tooth disease Father     Colon polyps Maternal Grandmother     Colon cancer Sister        SOCIAL HISTORY: Social History               Socioeconomic History   Marital status: Married  Tobacco Use   Smoking status: Former      Packs/day: 0.25      Years: 0.50      Pack years: 0.13      Types: Cigarettes      Quit date: 04/14/2011      Years since quitting: 10.8   Smokeless tobacco: Never   Tobacco comments:      Found out I was pregnant not long after I started smoking and haven't smoked since.  Vaping Use   Vaping Use: Never used  Substance and Sexual Activity   Alcohol use: No   Drug use: No   Sexual activity: Yes      Partners: Male      Birth control/protection: Implant  Social History Narrative    Single, lives with parents and daughter, Terrence Dupont, born 2013. High school graduate. First semester at [employer]. No currently working. No exercise.         PHYSICAL EXAM:      Vitals:    01/28/22 1539  BP: (!) 140/92  Pulse: 95    Body mass index is 38.04 kg/m. Weight: 94.3 kg (208 lb)    GENERAL: Alert, active, oriented x3   HEENT: Pupils equal reactive to light. Extraocular movements are intact. Sclera clear. Palpebral conjunctiva normal red color.Pharynx clear.   NECK: Supple with no palpable mass and no adenopathy.   LUNGS: Sound clear with  no rales rhonchi or wheezes.   HEART: Regular rhythm S1 and S2 without  murmur.   ABDOMEN: Soft and depressible, nontender with no palpable mass, no hepatomegaly.    EXTREMITIES: Well-developed well-nourished symmetrical with no dependent edema.   NEUROLOGICAL: Awake alert oriented, facial expression symmetrical, moving all extremities.   REVIEW OF DATA: I have reviewed the following data today:          Appointment on 12/30/2021  Component Date Value   WBC (White Blood Cell Co* 12/30/2021 11.4 (H)    RBC (Red Blood Cell Coun* 12/30/2021 4.94    Hemoglobin 12/30/2021 14.1    Hematocrit 12/30/2021 43.6    MCV (Mean Corpuscular Vo* 12/30/2021 88.3    MCH (Mean Corpuscular He* 12/30/2021 28.5    MCHC (Mean Corpuscular H* 12/30/2021 32.3    Platelet Count 12/30/2021 310    RDW-CV (Red Cell Distrib* 12/30/2021 14.6    MPV (Mean Platelet Volum* 12/30/2021 10.6    Neutrophils 12/30/2021 6.78    Lymphocytes 12/30/2021 3.27    Monocytes 12/30/2021 1.07    Eosinophils 12/30/2021 0.15    Basophils 12/30/2021 0.13 (H)    Neutrophil % 12/30/2021 59.4    Lymphocyte % 12/30/2021 28.6    Monocyte % 12/30/2021 9.4    Eosinophil % 12/30/2021 1.3    Basophil% 12/30/2021 1.1    Immature Granulocyte % 12/30/2021 0.2    Immature Granulocyte Cou* 12/30/2021 0.02    Glucose 12/30/2021 94    Sodium 12/30/2021 140    Potassium 12/30/2021 4.1    Chloride 12/30/2021 107    Carbon Dioxide (CO2) 12/30/2021 26.2    Urea Nitrogen (BUN) 12/30/2021 8    Creatinine 12/30/2021 0.6    Glomerular Filtration Ra* 12/30/2021 119    Calcium 12/30/2021 9.2    AST  12/30/2021 21    ALT  12/30/2021 36    Alk Phos (alkaline Phosp* 12/30/2021 73    Albumin 12/30/2021 4.1    Bilirubin, Total 12/30/2021 0.8    Protein, Total 12/30/2021 7.1    A/G Ratio 12/30/2021 1.4    H Pylori IgG - [facility] 12/30/2021 0.14       ASSESSMENT: Autumn Davies is a 31 y.o. female presenting for consultation for cholelithiasis.      Patient was oriented about the diagnosis of cholelithiasis. Also oriented about what is the gallbladder, its anatomy and function and the implications of having stones. The patient was oriented about the treatment alternatives (observation vs cholecystectomy).  I discussed with patient that abdominal pain is multifactorial.  Due to the patient history of IBS she could continue having different type of abdominal pain.  Patient was oriented that a low percentage of patient will continue to have similar pain symptoms even after the gallbladder is removed. Surgical technique (open vs laparoscopic) was discussed. It was also discussed the goals of the surgery (decrease the pain episodes and avoid the risk of cholecystitis) and the risk of surgery including: bleeding, infection, common bile duct injury, stone retention, injury to other organs such as bowel, liver, stomach, other complications such as hernia, bowel obstruction among others. Also discussed with patient about anesthesia and its complications such as: reaction to medications, pneumonia, heart complications, death, among others.    Cholelithiasis without cholecystitis [K80.20]   PLAN: 1.  Robotic assisted laparoscopic cholecystectomy (17408)    Patient verbalized understanding, all questions were answered, and were agreeable with the plan outlined above.        Herbert Pun, MD

## 2022-03-07 NOTE — Anesthesia Postprocedure Evaluation (Signed)
Anesthesia Post Note  Patient: Autumn Davies  Procedure(s) Performed: XI ROBOTIC ASSISTED LAPAROSCOPIC CHOLECYSTECTOMY (Abdomen) INDOCYANINE GREEN FLUORESCENCE IMAGING (ICG)  Patient location during evaluation: PACU Anesthesia Type: General Level of consciousness: awake and alert Pain management: pain level controlled Vital Signs Assessment: post-procedure vital signs reviewed and stable Respiratory status: spontaneous breathing, nonlabored ventilation, respiratory function stable and patient connected to nasal cannula oxygen Cardiovascular status: blood pressure returned to baseline and stable Postop Assessment: no apparent nausea or vomiting Anesthetic complications: no   No notable events documented.   Last Vitals:  Vitals:   03/05/22 0945 03/05/22 1010  BP: 103/71 116/71  Pulse: 76 89  Resp: 13 20  Temp:  (!) 36.1 C  SpO2: 96% 96%    Last Pain:  Vitals:   03/06/22 1804  TempSrc:   PainSc: 2                  Lenard Simmer

## 2022-03-08 LAB — SURGICAL PATHOLOGY

## 2022-03-18 ENCOUNTER — Other Ambulatory Visit (HOSPITAL_COMMUNITY)
Admission: RE | Admit: 2022-03-18 | Discharge: 2022-03-18 | Disposition: A | Discharge status: Home / Self Care | Payer: BC Managed Care – PPO | Source: Ambulatory Visit | Attending: Obstetrics | Admitting: Obstetrics

## 2022-03-18 ENCOUNTER — Ambulatory Visit (INDEPENDENT_AMBULATORY_CARE_PROVIDER_SITE_OTHER): Payer: BC Managed Care – PPO | Admitting: Obstetrics

## 2022-03-18 ENCOUNTER — Encounter: Payer: Self-pay | Admitting: Obstetrics

## 2022-03-18 VITALS — BP 122/70 | Ht 63.0 in | Wt 207.0 lb

## 2022-03-18 DIAGNOSIS — Z01419 Encounter for gynecological examination (general) (routine) without abnormal findings: Secondary | ICD-10-CM

## 2022-03-18 DIAGNOSIS — Z124 Encounter for screening for malignant neoplasm of cervix: Secondary | ICD-10-CM | POA: Insufficient documentation

## 2022-03-18 NOTE — Progress Notes (Signed)
Gynecology Annual Exam   PCP: Su Monks, PA  Chief Complaint:  Chief Complaint  Patient presents with   Annual Exam    History of Present Illness: Patient is a 31 y.o. [id number] presents for annual exam. The patient has no complaints today. She had her gallbladder removed last week  LMP: No LMP recorded. Patient has had an implant. A The patient is sexually active. She currently uses Nexplanon for contraception. She denies dyspareunia.  The patient does not perform self breast exams.  There is no notable family history of breast or ovarian cancer in her family.  The patient wears seatbelts: yes.   The patient has regular exercise: no.    The patient denies current symptoms of depression.  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    Past Surgical History:  Past Surgical History:  Procedure Laterality Date   CESAREAN SECTION  03/25/2021   Procedure: CESAREAN SECTION;  Surgeon: Nadara Mustard, MD;  Location: [facility] ORS;  Service: Obstetrics;;   CHOLECYSTECTOMY     LAPAROSCOPY N/A 12/25/2015   Procedure: LAPAROSCOPY DIAGNOSTIC;  Surgeon: Conard Novak, MD;  Location: [facility] ORS;  Service: Gynecology;  Laterality: N/A;   LEEP N/A 03/18/2020   Procedure: LOOP ELECTROSURGICAL EXCISION PROCEDURE (LEEP);  Surgeon: Conard Novak, MD;  Location: [facility] ORS;  Service: Gynecology;  Laterality: N/A;    Gynecologic History:  No LMP recorded. Patient has had an implant. Contraception: Nexplanon Last Pap:  Results were: no abnormalities   Obstetric History: [id number]  Family History:  Family History  Problem Relation Age of Onset   Charcot-Marie-Tooth disease Father    Charcot-Marie-Tooth disease Sister    Heart disease Maternal Grandmother    Hyperlipidemia Maternal Grandmother    Heart disease Maternal Grandfather     Social History:  Social History   Socioeconomic History   Marital status: Married    Spouse name: Gerilyn Pilgrim   Number of children: 2   Years of education: Not on file   Highest education level: Not on file  Occupational History   Not on file  Tobacco Use   Smoking status: Former    Packs/day: 0.00    Types: Cigarettes   Smokeless tobacco: Never  Vaping Use   Vaping Use: Never used  Substance and Sexual Activity   Alcohol use: Yes    Comment: rarely   Drug use: No   Sexual activity: Yes    Birth control/protection: Implant  Other Topics Concern   Not on file  Social History Narrative   Not on file   Social Determinants of Health   Financial Resource Strain: Not on file  Food Insecurity: Not on file  Transportation Needs: Not on file  Physical Activity: Not on file  Stress: Not on file  Social Connections: Not on file  Intimate Partner Violence: Not on file    Allergies:  Allergies  Allergen Reactions   Citalopram Other (See Comments)    Headache   Sulfa Antibiotics Rash    Red rash all over body  Sulfamethoxazole-Trimethoprim Itching and Rash    Medications: Prior to Admission medications   Medication Sig Start Date End Date Taking? Authorizing Provider  doxepin (SINEQUAN) 10 MG capsule  01/05/18 01/20/20  [provider]  prochlorperazine (COMPAZINE) 10 MG tablet TAKE 1 TABLET(10 MG) BY MOUTH EVERY 6 HOURS AS NEEDED FOR NAUSEA OR HEADACHE 03/23/21 03/25/21  Vena Austria, MD  progesterone (PROMETRIUM) 200 MG capsule Place 1 capsule (200 mg total) vaginally daily. 11/26/20 03/25/21  Conard Novak, MD    Physical Exam Vitals:  not currently breastfeeding.  General: NAD HEENT: normocephalic, anicteric Thyroid: no enlargement, no palpable nodules Pulmonary: No increased work of breathing, CTAB Cardiovascular: RRR, distal pulses 2+ Breast: Breast symmetrical, no tenderness, no palpable nodules or masses, no skin or nipple retraction present, no nipple discharge.  No axillary or supraclavicular lymphadenopathy. Abdomen: NABS, soft, non-tender, non-distended.  Umbilicus without lesions.  No hepatomegaly, splenomegaly or masses palpable. No evidence of hernia  Genitourinary:  External: Normal external female genitalia.  Normal urethral meatus, normal Bartholin's and Skene's glands.    Vagina: Normal vaginal mucosa, no evidence of prolapse.    Cervix: Grossly normal in appearance, no bleeding  Uterus: Non-enlarged, mobile, normal contour.  No CMT  Adnexa: ovaries non-enlarged, no adnexal masses  Rectal: deferred  Lymphatic: no evidence of inguinal lymphadenopathy Extremities: no edema, erythema, or tenderness Neurologic: Grossly intact Psychiatric: mood appropriate, affect full  Female chaperone present for pelvic and breast  portions of the physical exam    Assessment: 31 y.o. [id number] routine annual exam  Plan: Problem List Items Addressed This Visit   None Visit Diagnoses     Cervical cancer screening    -  Primary   Relevant Orders   Cytology - PAP   Women's annual routine gynecological examination       Relevant Orders   Cytology - PAP       2) STI screening  wasoffered and declined  2)  ASCCP guidelines and rational discussed.  Patient opts for  another yearly pap, and then may switch to every three years if the next is NILM.    3) Contraception - the patient is currently using  Nexplanon.  She is happy with her current form of contraception and plans to continue  4) Routine healthcare maintenance including cholesterol, diabetes screening discussed managed by PCP  5) No follow-ups on  file.  Mirna Mires, CNM  03/18/2022 10:01 AM   [facility], [facility] 03/18/2022, 10:01 AM

## 2022-03-24 LAB — CYTOLOGY - PAP
Comment: NEGATIVE
Diagnosis: UNDETERMINED — AB
High risk HPV: NEGATIVE

## 2022-03-26 ENCOUNTER — Encounter: Payer: Self-pay | Admitting: Obstetrics

## 2022-03-27 ENCOUNTER — Encounter: Payer: Self-pay | Admitting: Obstetrics

## 2022-04-16 ENCOUNTER — Encounter: Payer: Self-pay | Admitting: General Surgery

## 2022-05-18 IMAGING — US US MFM OB FOLLOW-UP
1 series · 13 of 28 positions shown · non-contrast
Comparison: none

[Series 1: us mfm ob follow-up · 53 acquisitions, 13 frames shown]
[im 2/53]
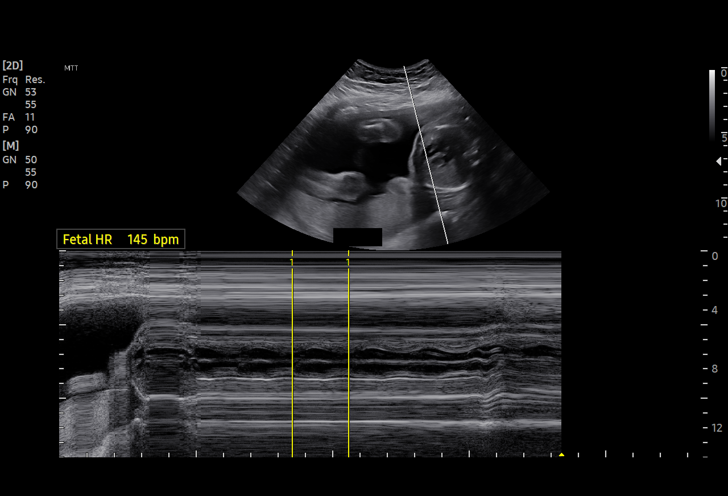
[im 6/53]
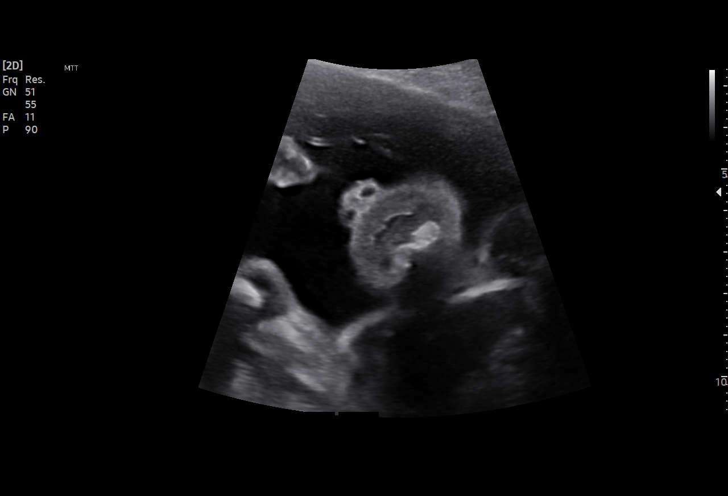
[im 10/53]
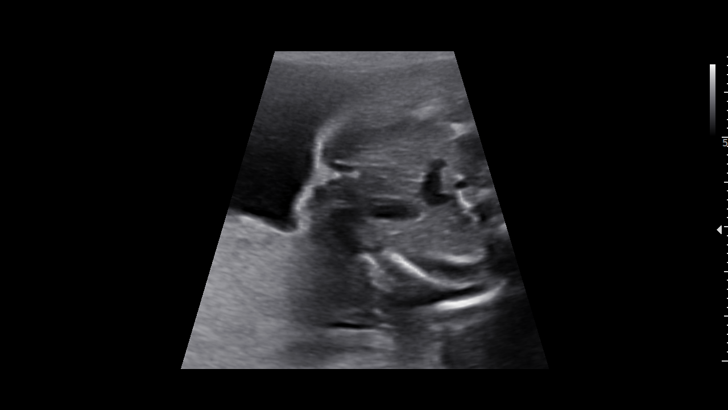
[im 14/53]
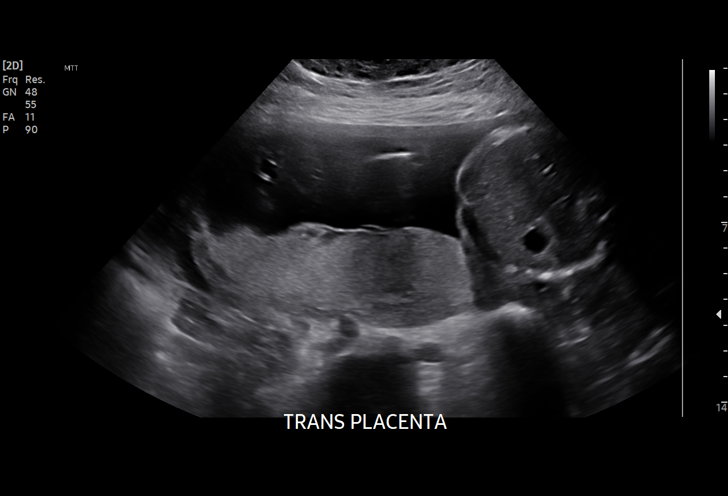
[im 18/53]
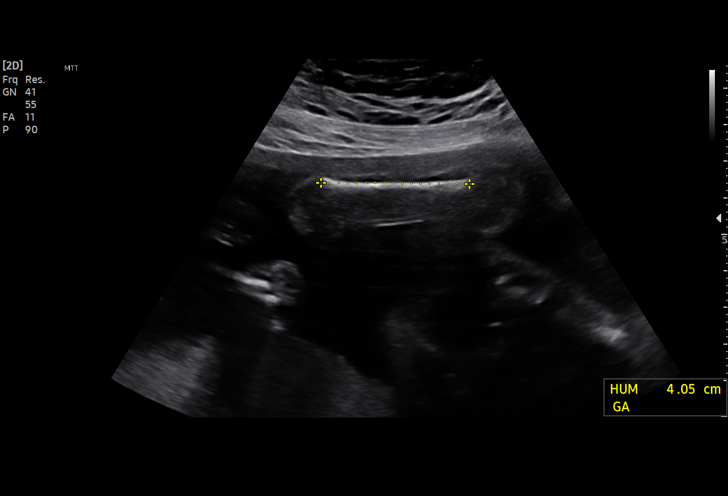
[im 22/53]
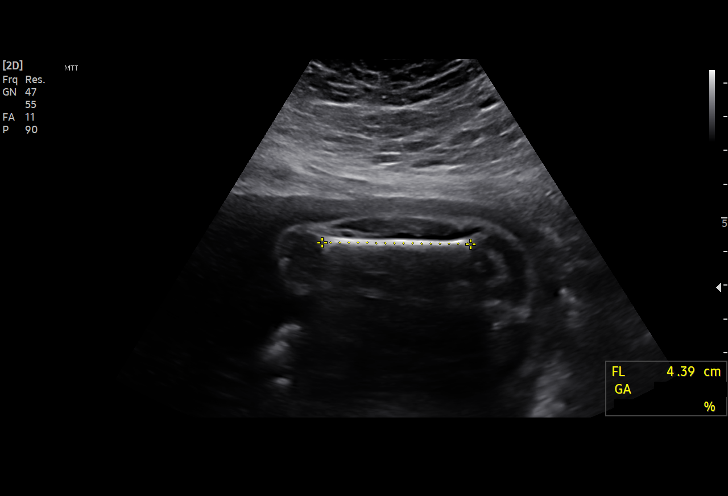
[im 27/53]
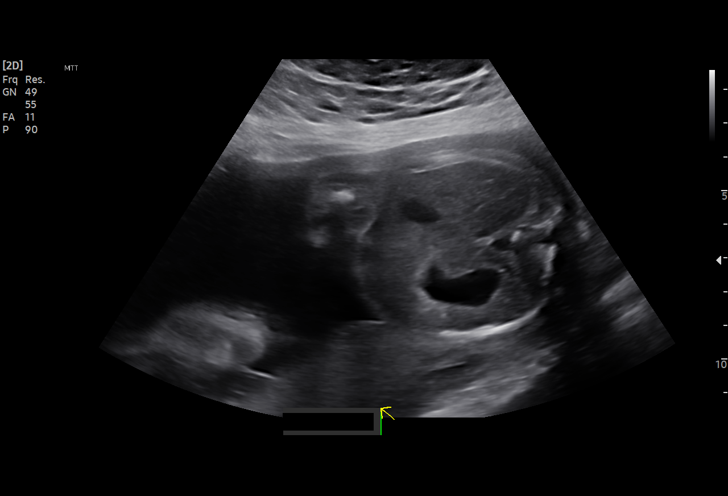
[im 31/53]
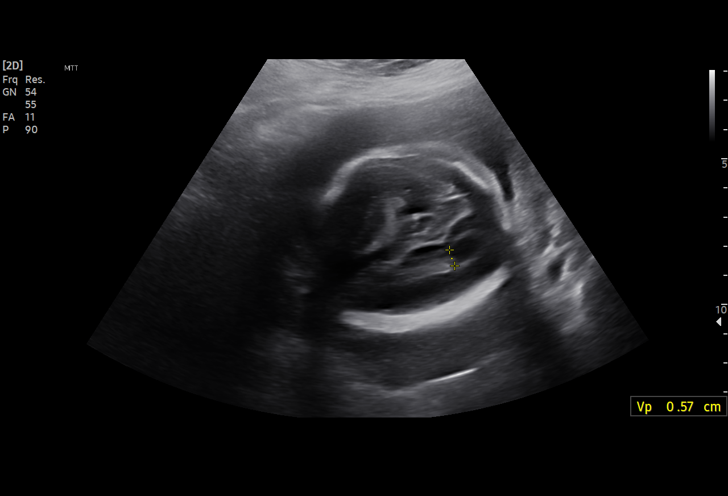
[im 35/53]
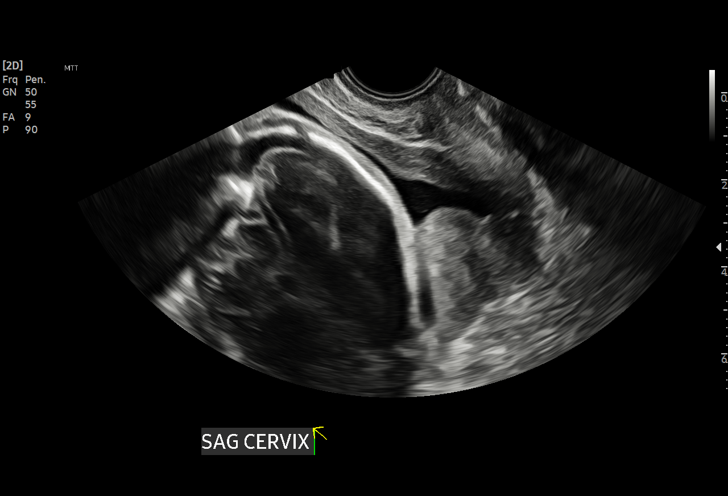
[im 39/53]
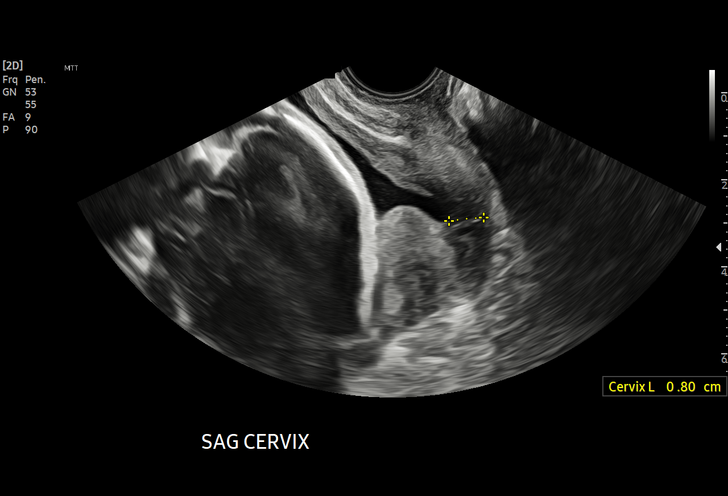
[im 43/53]
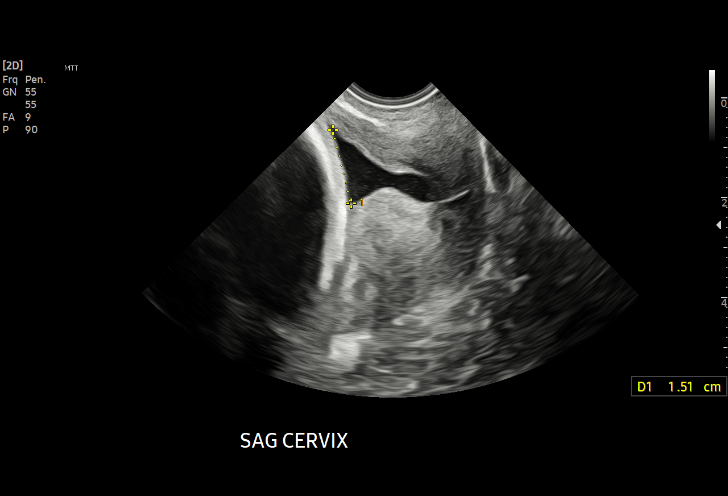
[im 47/53]
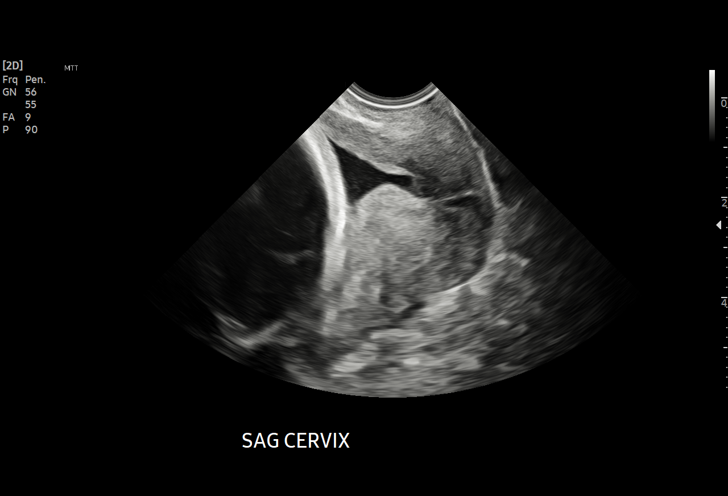
[im 51/53]
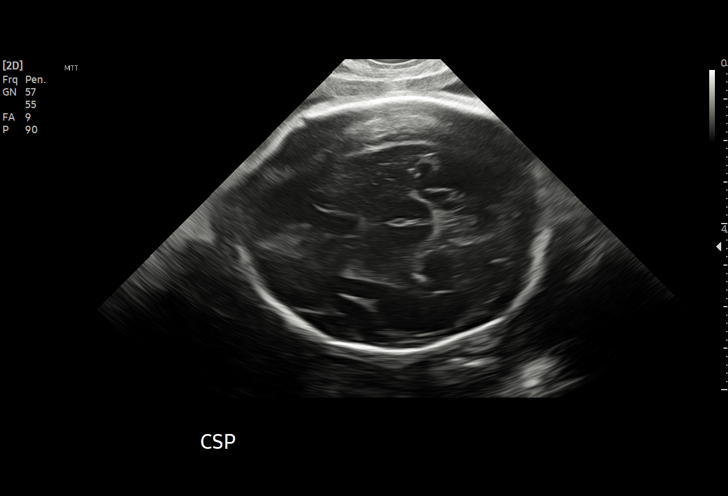

[13 of 28 positions shown; findings below may reference images not displayed]

Indications

 Cervical shortening, second trimester
 23 weeks gestation of pregnancy
 Previous cervical surgery (LEEP 1810)
 Antenatal follow-up for nonvisualized fetal
 anatomy
 Poor obstetric history: Previous
 preeclampsia / eclampsia/gestational HTN
Fetal Evaluation

 Num Of Fetuses:         1
 Fetal Heart Rate(bpm):  145
 Cardiac Activity:       Observed
 Presentation:           Cephalic
 Placenta:               Posterior
 P. Cord Insertion:      Previously seen as normal

 Amniotic Fluid
 AFI FV:      Within normal limits

                             Largest Pocket(cm)

Biometry
 BPD:      60.8  mm     G. Age:  24w 5d         75  %    CI:        73.52   %    70 - 86
                                                         FL/HC:      19.6   %    18.7 -
 HC:      225.3  mm     G. Age:  24w 4d         60  %    HC/AC:      1.19        1.05 -
 AC:      188.8  mm     G. Age:  23w 4d         34  %    FL/BPD:     72.7   %    71 - 87
 FL:       44.2  mm     G. Age:  24w 4d         60  %    FL/AC:      23.4   %    20 - 24
 HUM:      40.2  mm     G. Age:  24w 3d         55  %
 LV:        5.7  mm

 Est. FW:     663  gm      1 lb 7 oz     54  %
OB History

 Gravidity:    3         Term:   1        Prem:   0        SAB:   1
 TOP:          0       Ectopic:  0        Living: 1
Gestational Age

 LMP:           23w 6d        Date:  07/10/20                 EDD:   04/16/21
 U/S Today:     24w 3d                                        EDD:   04/12/21
 Best:          23w 6d     Det. By:  LMP  (07/10/20)          EDD:   04/16/21
Anatomy

 Cranium:               Appears normal         LVOT:                   Appears normal
 Cavum:                 Appears normal         Aortic Arch:            Previously seen
 Ventricles:            Appears normal         Ductal Arch:            Previously seen
 Choroid Plexus:        Previously seen        Diaphragm:              Appears normal
 Cerebellum:            Previously seen        Stomach:                Appears normal, left
                                                                       sided
 Posterior Fossa:       Previously seen        Abdomen:                Appears normal
 Nuchal Fold:           Previously seen        Abdominal Wall:         Previously seen
 Face:                  Orbits and profile     Cord Vessels:           Previously seen
                        previously seen
 Lips:                  Appears normal         Kidneys:                Appear normal
 Palate:                Not well visualized    Bladder:                Appears normal
 Thoracic:              Appears normal         Spine:                  Previously seen
 Heart:                 Previously seen        Upper Extremities:      Previously seen
 RVOT:                  Previously seen        Lower Extremities:      Previously seen

 Other:  Heels prev visualized. Technically difficult due to fetal position.
Cervix Uterus Adnexa

 Cervix
 Length:            0.7  cm.
 Funneling of internal os noted.
Impression

 On ultrasound, amniotic fluid is normal and good fetal activity
 seen.  Fetal growth is appropriate for gestational age.  Fetal
 anatomical survey was completed and appears normal.
 Cephalic presentation.
 We performed transvaginal ultrasound to evaluate the cervix.
 Funneling is seen in the proximal portion of the cervical canal
 is dilated.  The residual closed portion of the cervix measures
 7 mm.
 After explaining, I performed a sterile speculum examination.
 The external os is closed.  The cervix appears about 2 cm
 long (visual).  Vagina appears healthy.
 xxxxxxxxxxxxxxxxxxxxxxxxxxxxxxxxxxxxxxxxxxxxxxxxxx
 Consultation ([REDACTED])
 I had the pleasure of seeing Ms. Girardi today at the Center
 Patient returned for completion of fetal anatomy and cervical

 vaginal delivery.  In February 2020, she had a LEEP.  On previous
 ultrasound, the cervix measured between 2.2 to 2.7 cm.
 Patient does not have symptoms of uterine contractions or
 vaginal bleeding.
 I explained the finding of cervical shortening that increases
 the risk of preterm premature rupture membranes and
 preterm delivery.
 In the absence of symptoms of uterine contractions, inpatient
 management is not necessary.  Patient was instructed on the
 symptoms and signs of preterm labor, and because of early
 gestational age, delivery is preferable at [HOSPITAL],
 [HOSPITAL].
 I explained that history of LEEP is a possible cause of
 cervical incompetence. Overall risk for preterm delivery
 following LEEP is only slightly increased. The interval since
 LEEP is not associated with increased risk. Mid-trimester
 pregnancy loss can be associated with LEEP, but less
 common than preterm deliveries.
 I discussed the benefit of antenatal corticosteroid for fetal
 pulmonary maturity.  Patient agreed to take Lack
 injections.  First dose of Lack was administered
 today.
 I will discuss with Dr. Sharma, her Ob Provider.
 Consultation including face-to-face counseling: 30 minutes.
Recommendations

 -Patient will be returning tomorrow morning for her second
 dose of Lack.
 -OB appointment in 1 to 2 weeks (cervix to be assessed by
 speculum examination).
 -An appointment was made for a follow-up ultrasound in 4
 weeks.
                 Shilova, [place]

## 2022-06-11 ENCOUNTER — Ambulatory Visit (INDEPENDENT_AMBULATORY_CARE_PROVIDER_SITE_OTHER): Payer: Self-pay

## 2022-06-11 VITALS — BP 122/80 | Ht 63.0 in | Wt 207.0 lb

## 2022-06-11 DIAGNOSIS — R300 Dysuria: Secondary | ICD-10-CM

## 2022-06-11 LAB — POCT URINALYSIS DIPSTICK
Glucose, UA: NEGATIVE
Nitrite, UA: NEGATIVE
Protein, UA: POSITIVE — AB
Spec Grav, UA: 1.01 (ref 1.010–1.025)
Urobilinogen, UA: 0.2 E.U./dL
pH, UA: 8 (ref 5.0–8.0)

## 2022-06-11 MED ORDER — NITROFURANTOIN MONOHYD MACRO 100 MG PO CAPS: 100.0000 mg | ORAL_CAPSULE | Freq: Two times a day (BID) | ORAL | 0 refills | Status: DC

## 2022-06-11 NOTE — Progress Notes (Signed)
SUBJECTIVE: Autumn Davies is a 31 y.o. female who complains of urinary frequency, urgency and dysuria x 3 days, without flank pain, fever, chills, or abnormal vaginal discharge or bleeding.   OBJECTIVE: Appears well, in no apparent distress. Vital signs normal. Urine dipstick shows positive for WBC's, positive for RBC's, and positive for protein.  Micro exam: not done.   ASSESSMENT: UTI uncomplicated without evidence of pyelonephritis  PLAN: Treatment per orders - also push fluids, may use Pyridium OTC prn. Call or return to clinic prn if these symptoms worsen or fail to improve as anticipated.

## 2022-06-11 NOTE — Addendum Note (Signed)
Addended by: Quintella Baton D on: 06/11/2022 11:07 AM   Modules accepted: Orders

## 2022-06-15 LAB — URINE CULTURE

## 2022-06-15 IMAGING — US US MFM OB FOLLOW-UP
1 series · 13 of 28 positions shown · non-contrast
Comparison: none

[Series 1: us mfm ob follow-up · 13 of 44 slices shown]
[im 2/44]
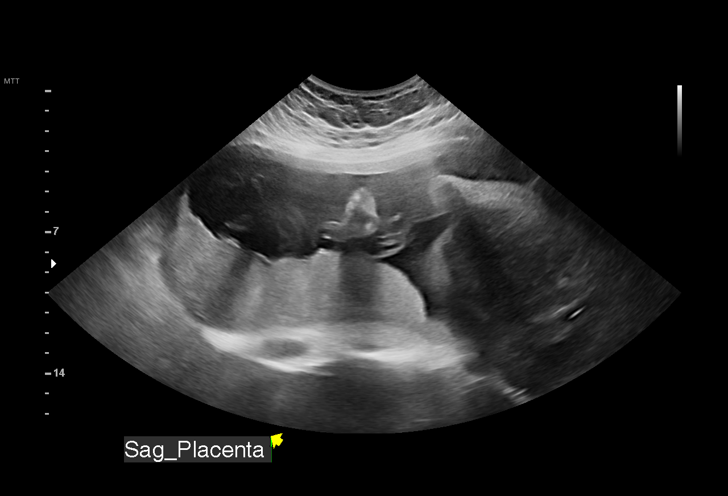
[im 5/44]
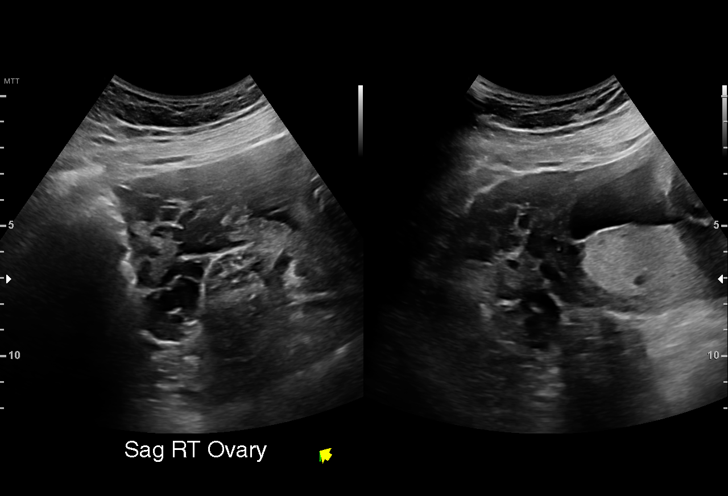
[im 8/44]
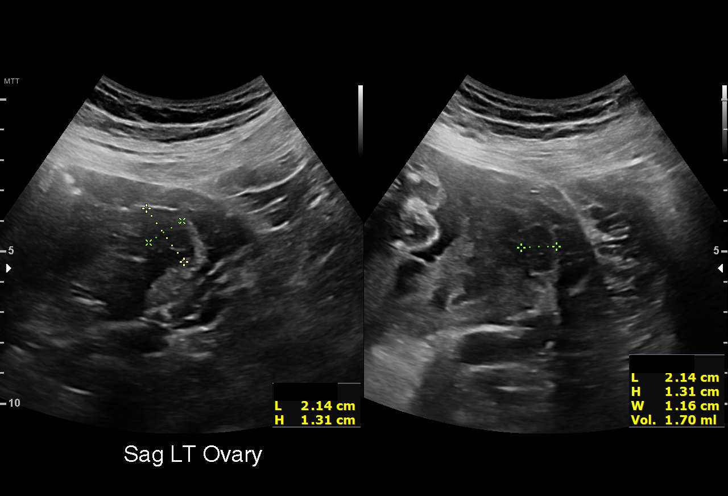
[im 12/44]
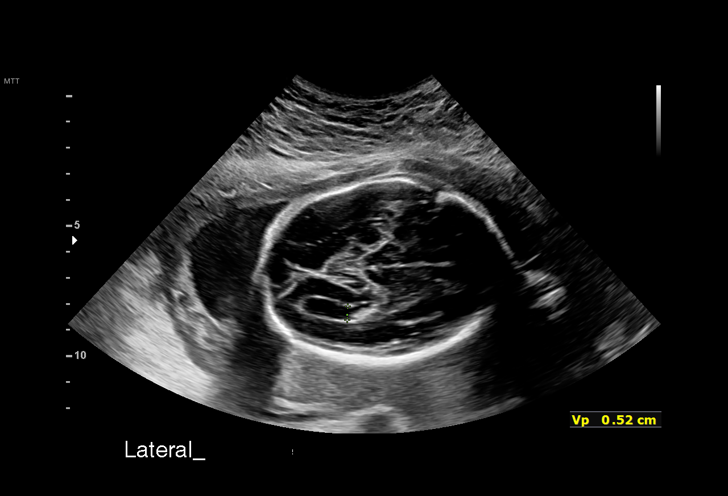
[im 15/44]
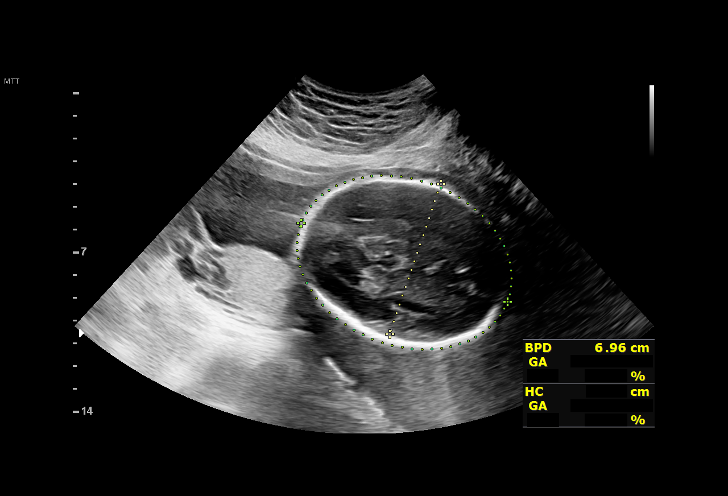
[im 18/44]
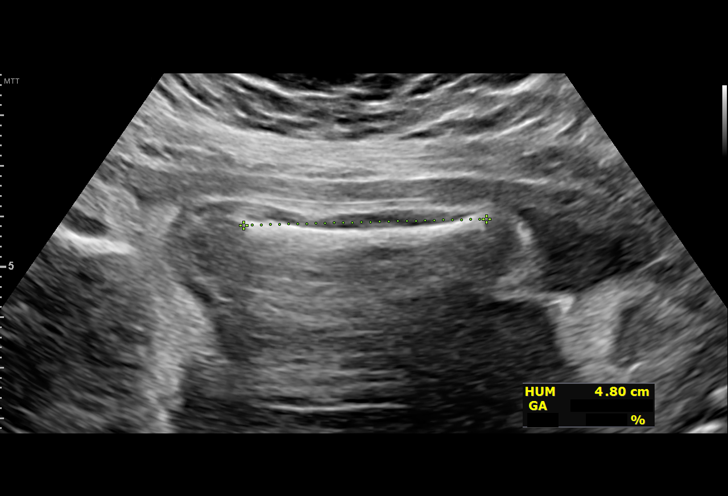
[im 23/44]
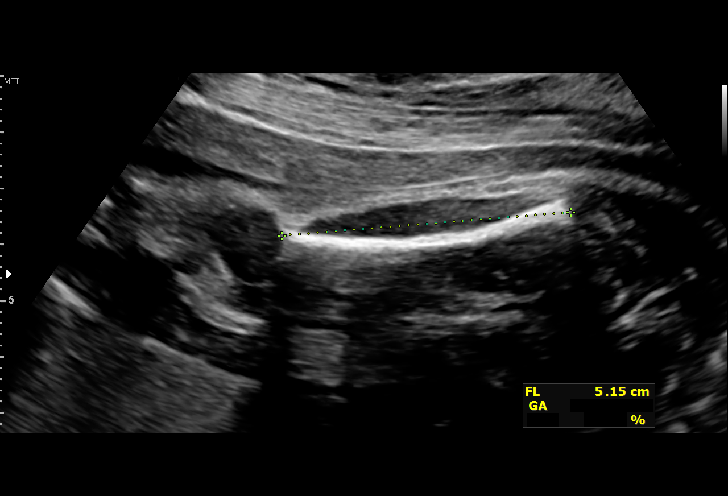
[im 26/44]
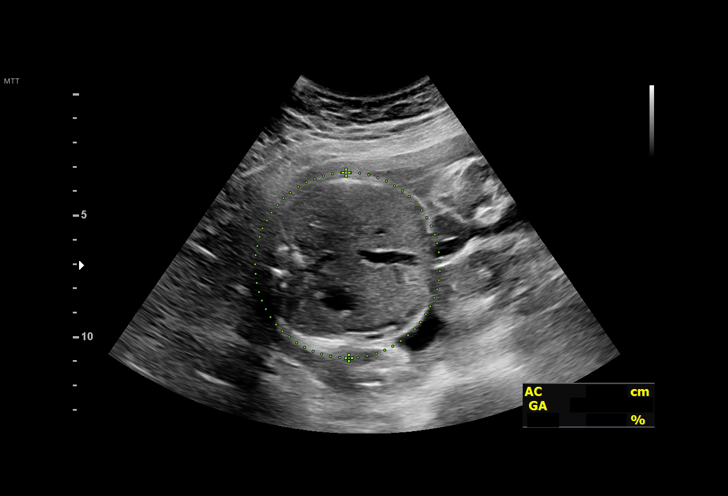
[im 29/44]
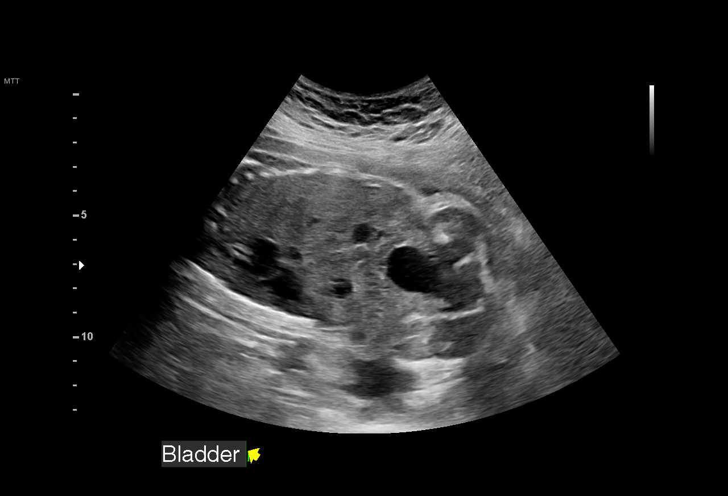
[im 32/44]
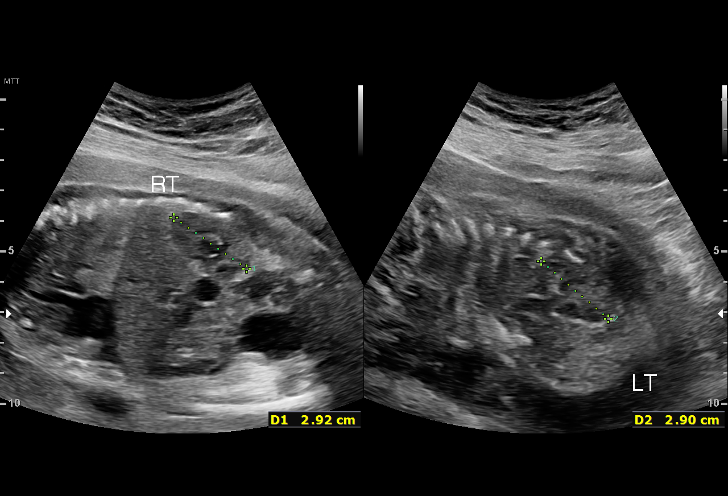
[im 36/44]
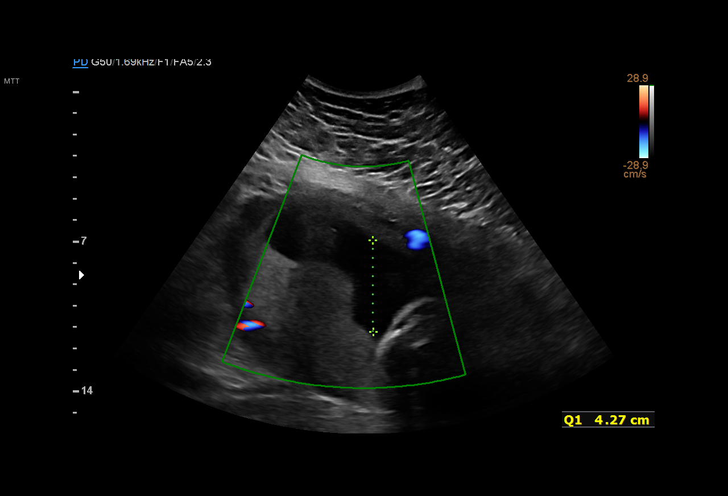
[im 39/44]
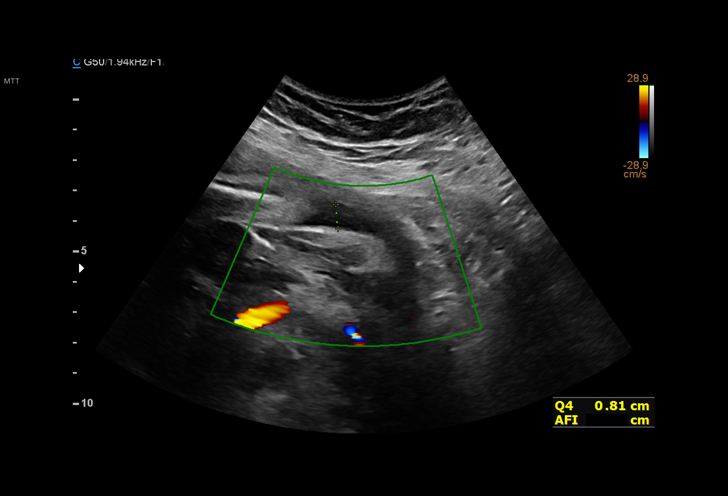
[im 42/44]
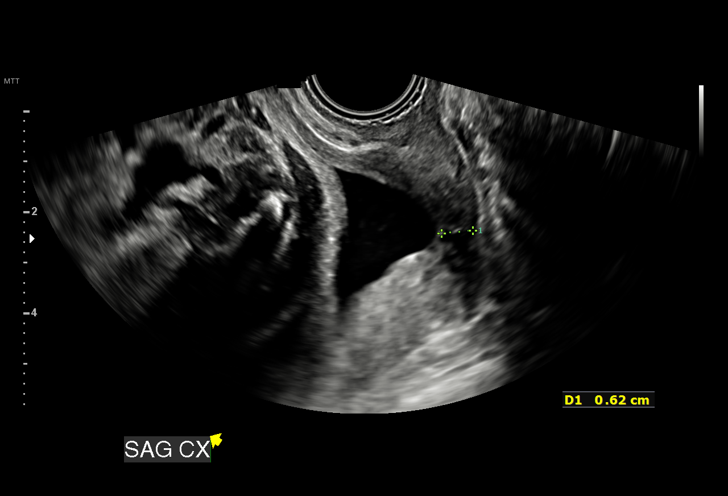

[13 of 28 positions shown; findings below may reference images not displayed]

Attending:        Dexter Dany        Secondary Phy.:   [facility]/Triage

Indications

 27 weeks gestation of pregnancy
 Cervical shortening, second trimester
 Previous cervical surgery (LEEP 6964)
 Poor obstetric history: Previous
 preeclampsia / eclampsia/gestational HTN
 Encounter for other antenatal screening
 follow-up
Fetal Evaluation

 Num Of Fetuses:         1
 Fetal Heart Rate(bpm):  138
 Cardiac Activity:       Observed
 Presentation:           Breech
 Placenta:               Posterior
 P. Cord Insertion:      Previously Visualized

 Amniotic Fluid
 AFI FV:      Subjectively low-normal

 AFI Sum(cm)     %Tile       Largest Pocket(cm)
 10.4            14

 RUQ(cm)       RLQ(cm)       LUQ(cm)        LLQ(cm)
 4.3           0.8           0
Biometry
 BPD:      69.4  mm     G. Age:  27w 6d         39  %    CI:        68.21   %    70 - 86
                                                         FL/HC:      19.1   %    18.8 -
 HC:      268.7  mm     G. Age:  29w 2d         66  %    HC/AC:      1.14        1.05 -
 AC:      236.6  mm     G. Age:  28w 0d         46  %    FL/BPD:     74.1   %    71 - 87
 FL:       51.4  mm     G. Age:  27w 3d         25  %    FL/AC:      21.7   %    20 - 24
 HUM:        48  mm     G. Age:  28w 1d         53  %
 CER:      32.8  mm     G. Age:  28w 0d         54  %
 LV:        5.2  mm
 CM:        7.5  mm
 Est. FW:    7762  gm      2 lb 8 oz     40  %
OB History

 Gravidity:    3         Term:   1        Prem:   0        SAB:   1
 TOP:          0       Ectopic:  0        Living: 1
Gestational Age

 LMP:           27w 6d        Date:  07/10/20                 EDD:   04/16/21
 U/S Today:     28w 1d                                        EDD:   04/14/21
 Best:          27w 6d     Det. By:  LMP  (07/10/20)          EDD:   04/16/21
Anatomy

 Cranium:               Appears normal         LVOT:                   Appears normal
 Cavum:                 Appears normal         Aortic Arch:            Previously seen
 Ventricles:            Appears normal         Ductal Arch:            Previously seen
 Choroid Plexus:        Previously seen        Diaphragm:              Appears normal
 Cerebellum:            Appears normal         Stomach:                Appears normal, left
                                                                       sided
 Posterior Fossa:       Appears normal         Abdomen:                Appears normal
 Nuchal Fold:           Not applicable (>20    Abdominal Wall:         Previously seen
                        wks GA)
 Face:                  Profile nl; orbits     Cord Vessels:           Previously seen
                        previously visualized
 Lips:                  Previously seen        Kidneys:                Appear normal
 Palate:                Not well visualized    Bladder:                Appears normal
 Thoracic:              Previously seen        Spine:                  Previously seen
 Heart:                 Previously seen        Upper Extremities:      Previously seen
 RVOT:                  Previously seen        Lower Extremities:      Previously seen

 Other:  Heels prev visualized. Technically difficult due to fetal position.
Cervix Uterus Adnexa

 Cervix
 Length:            0.5  cm.
 Funneling of internal os.

 Uterus
 No abnormality visualized.

 Right Ovary
 Within normal limits.
 Left Ovary
 Within normal limits.

 Cul De Sac
 No free fluid seen.

 Adnexa
 No adnexal mass visualized.
Impression

 Cervical insufficiency diagnosed at 23w 6d gestation. Patient
 takes vaginal progesterone daily. She does not have uterine
 contractions or vaginal bleeding. She reports watery
 discharge.
 On previous ultrasound, the cervix measured less than 1 cm.
 She does not have gestational diabetes. Patient had received
 antenatal corticosteroids at 24 weeks' gestation.
 On today's ultrasound, fetal growth is appropriate for
 gestational age.  Amniotic fluid is normal and good fetal
 activity seen.  Breech presentation.  We performed a
 transvaginal ultrasound to evaluate the cervix.
 Funneling is seen in the proximal portion of the cervical canal
 is dilated.  The residual closed portion of the cervix measures
 5 mm.
 After explaining I performed sterile speculum examination.
 The cervix is about 2 cm long (visual) and external os is
 closed.
 I reassured the patient of the finding that is not consistent
 with preterm rupture membranes.  The cervical length is
 essentially the same as seen at 24 weeks gestation.
 I encouraged her to continue vaginal progesterone daily.
 I explained breech presentation and instructed her to come to
 the hospital if she goes into preterm labor or has rupture
 membranes.
 [HOSPITAL] if she goes into preterm labor.  We will continue
 her prenatal visit follow-up with Dr. Jim.
Recommendations

 -An appointment was made for her to return in 4 weeks for
 fetal growth assessment.
 -Continue vaginal progesterone till 36 weeks' gestation.
                 Erxleben, [place]ien[place]

## 2022-07-01 ENCOUNTER — Ambulatory Visit (INDEPENDENT_AMBULATORY_CARE_PROVIDER_SITE_OTHER): Payer: Self-pay | Admitting: Obstetrics and Gynecology

## 2022-07-01 ENCOUNTER — Encounter: Payer: Self-pay | Admitting: Obstetrics and Gynecology

## 2022-07-01 VITALS — BP 130/80 | Ht 63.0 in | Wt 208.0 lb

## 2022-07-01 DIAGNOSIS — Z3202 Encounter for pregnancy test, result negative: Secondary | ICD-10-CM

## 2022-07-01 DIAGNOSIS — N809 Endometriosis, unspecified: Secondary | ICD-10-CM

## 2022-07-01 DIAGNOSIS — R1031 Right lower quadrant pain: Secondary | ICD-10-CM

## 2022-07-01 DIAGNOSIS — Z975 Presence of (intrauterine) contraceptive device: Secondary | ICD-10-CM

## 2022-07-01 DIAGNOSIS — N921 Excessive and frequent menstruation with irregular cycle: Secondary | ICD-10-CM

## 2022-07-01 LAB — POCT URINALYSIS DIPSTICK
Bilirubin, UA: NEGATIVE
Blood, UA: NEGATIVE
Glucose, UA: NEGATIVE
Ketones, UA: NEGATIVE
Leukocytes, UA: NEGATIVE
Nitrite, UA: NEGATIVE
Protein, UA: NEGATIVE
Spec Grav, UA: 1.025 (ref 1.010–1.025)
pH, UA: 6 (ref 5.0–8.0)

## 2022-07-01 LAB — POCT URINE PREGNANCY: Preg Test, Ur: NEGATIVE

## 2022-07-01 NOTE — Patient Instructions (Signed)
I value your feedback and you entrusting us with your care. If you get a [facility] patient survey, I would appreciate you taking the time to let us know about your experience today. Thank you! ? ? ?

## 2022-07-01 NOTE — Progress Notes (Signed)
Su Monks, PA   Chief Complaint  Patient presents with   Pelvic Pain    Sharp right side, vaginal bleeding x 4 days     HPI:      Ms. Autumn Davies is a 31 y.o. [id number] whose LMP was Patient's last menstrual period was 06/28/2022 (approximate)., presents today for RLQ pain and vaginal bleeding for 4 days. Pain is constant and dull with intermittent sharp pain. No aggrav factors. Has taken NSAIDs/tylenol with some relief. No GI, vag, urin sx. No fevers/chills. Has had some RT LBP with RLQ pain. Treated for UTI 2 wks ago with sx relief. Hx of RTO cyst in past and sx feel similar. Also has lap confirmed dx of endometriosis/menometrorrhagia, controlled with nexplanon.  Pt had nexplanon placed PP 9/22. Pt is amenorrheic so current bleeding abnormal for pt. Was amenorrheic with prior nexplanon before last pregnancy. Flow is light to mod, increased RLQ pain with heavier flow.  Hx of ASCUS/neg HPV 12/21, 7/23. Hx of CIN 3 7/21.   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    Past Surgical History:  Procedure Laterality Date   CESAREAN SECTION  03/25/2021   Procedure: CESAREAN SECTION;  Surgeon: Nadara Mustard, MD;  Location: [facility] ORS;  Service: Obstetrics;;   CHOLECYSTECTOMY     LAPAROSCOPY N/A 12/25/2015   Procedure: LAPAROSCOPY DIAGNOSTIC;  Surgeon: Conard Novak, MD;  Location: [facility] ORS;  Service: Gynecology;  Laterality: N/A;   LEEP N/A 03/18/2020   Procedure: LOOP ELECTROSURGICAL EXCISION PROCEDURE (LEEP);  Surgeon: Conard Novak, MD;  Location: [facility] OR[facility];  Service:  Gynecology;  Laterality: N/A;    Family History  Problem Relation Age of Onset   Charcot-Marie-Tooth disease Father    Charcot-Marie-Tooth disease Sister    Heart disease Maternal Grandmother    Hyperlipidemia Maternal Grandmother    Heart disease Maternal Grandfather     Social History   Socioeconomic History   Marital status: Married    Spouse name: Gerilyn Pilgrim   Number of children: 2   Years of education: Not on file   Highest education level: Not on file  Occupational History   Not on file  Tobacco Use   Smoking status: Former    Packs/day: 0.00    Types: Cigarettes   Smokeless tobacco: Never  Vaping Use   Vaping Use: Never used  Substance and Sexual Activity   Alcohol use: Yes    Comment: rarely   Drug use: No   Sexual activity: Yes    Birth control/protection: Implant  Other Topics Concern   Not on file  Social History Narrative   Not on file   Social Determinants of Health   Financial Resource Strain: Not on file  Food Insecurity: Not on file  Transportation Needs: Not on file  Physical Activity: Not on file  Stress: Not on file  Social Connections: Not on file  Intimate Partner Violence: Not on file    Outpatient Medications Prior to Visit  Medication Sig Dispense Refill   buPROPion (WELLBUTRIN XL) 300 MG 24 hr tablet Take by mouth.     etonogestrel (NEXPLANON) 68 MG  IMPL implant 1 each by Subdermal route once.     PARoxetine (PAXIL) 20 MG tablet Take by mouth.     buPROPion (WELLBUTRIN) 75 MG tablet Take 1 tablet by mouth 2 (two) times daily.     nitrofurantoin, macrocrystal-monohydrate, (MACROBID) 100 MG capsule Take 1 capsule (100 mg total) by mouth 2 (two) times daily. 10 capsule 0   phentermine (ADIPEX-P) 37.5 MG tablet Take by mouth.     No facility-administered medications prior to visit.      ROS:  Review of Systems  Constitutional:  Negative for fever.  Gastrointestinal:  Negative for blood in stool, constipation, diarrhea, nausea and  vomiting.  Genitourinary:  Positive for pelvic pain and vaginal bleeding. Negative for dyspareunia, dysuria, flank pain, frequency, hematuria, urgency, vaginal discharge and vaginal pain.  Musculoskeletal:  Positive for back pain.  Skin:  Negative for rash.   BREAST: No symptoms   OBJECTIVE:   Vitals:  BP 130/80   Ht 5\' 3"  (1.6 m)   Wt 208 lb (94.3 kg)   LMP 06/28/2022 (Approximate)   Breastfeeding No   BMI 36.85 kg/m   Physical Exam Vitals reviewed.  Constitutional:      Appearance: She is well-developed.  Pulmonary:     Effort: Pulmonary effort is normal.  Abdominal:     Palpations: Abdomen is soft.     Tenderness: There is abdominal tenderness in the right lower quadrant and suprapubic area. There is no guarding or rebound.  Genitourinary:    General: Normal vulva.     Pubic Area: No rash.      Labia:        Right: No rash, tenderness or lesion.        Left: No rash, tenderness or lesion.      Vagina: Normal. No vaginal discharge, erythema or tenderness.     Cervix: Normal.     Uterus: Normal. Tender. Not enlarged.      Adnexa: Left adnexa normal.       Right: Tenderness present. No mass.         Left: No mass or tenderness.    Musculoskeletal:        General: Normal range of motion.     Cervical back: Normal range of motion.  Skin:    General: Skin is warm and dry.  Neurological:     General: No focal deficit present.     Mental Status: She is alert and oriented to person, place, and time.  Psychiatric:        Mood and Affect: Mood normal.        Behavior: Behavior normal.        Thought Content: Thought content normal.        Judgment: Judgment normal.     Results: Results for orders placed or performed in visit on 07/01/22 (from the past 24 hour(s))  POCT Urinalysis Dipstick     Status: Normal   Collection Time: 07/01/22  1:44 PM  Result Value Ref Range   Color, UA yellow    Clarity, UA clear    Glucose, UA Negative Negative   Bilirubin, UA neg     Ketones, UA neg    Spec Grav, UA 1.025 1.010 - 1.025   Blood, UA neg    pH, UA 6.0 5.0 - 8.0   Protein, UA Negative Negative   Urobilinogen, UA     Nitrite, UA neg    Leukocytes, UA Negative Negative   Appearance  Odor    POCT urine pregnancy     Status: Normal   Collection Time: 07/01/22  1:45 PM  Result Value Ref Range   Preg Test, Ur Negative Negative     Assessment/Plan: RLQ abdominal pain - Plan: US PELVIS TRANSVAGINAL NON-OB (TV ONLY), POCT urine pregnancy, POCT Urinalysis Dipstick; neg UPT, neg UA. Tender on exam. Check GYN u/s, will f/u with results. Question RTO cyst vs BTB with nexplanan and pain given hx of endometriosis. NSAID/heating pad prn pain.   Breakthrough bleeding on Nexplanon--reassurance.   Endometriosis determined by laparoscopy   Return in about 1 day (around 07/02/2022) for GYn u/s for RLQ pain--[facility] to call; work in tom AM between dating scans.  Therron Sells B. Ameshia Pewitt, PA-C 07/01/2022 1:45 PM

## 2022-07-02 ENCOUNTER — Ambulatory Visit (INDEPENDENT_AMBULATORY_CARE_PROVIDER_SITE_OTHER): Payer: Self-pay

## 2022-07-02 DIAGNOSIS — R102 Pelvic and perineal pain: Secondary | ICD-10-CM

## 2022-07-02 DIAGNOSIS — R1031 Right lower quadrant pain: Secondary | ICD-10-CM

## 2022-12-29 LAB — HM PAP SMEAR

## 2023-04-14 NOTE — Progress Notes (Signed)
[name] Married Caucasian female here for NEW GYN/ second opinion for chronic pelvic pain.    Has daily pelvic pain, mostly in the right lower quadrant. Pain does not last all day.   Does not always have pain when she has bleeding.   Pain with sex with deep penetration and following sex.   Since GB removal, more loose BMs.   No pain with urination.   No blood in urine or stool.   Went to [facility].  Had a pap and pelvic ultrasound, both normal per patient.  She was told it could be scar tissue, endometriosis, or adenomyosis.  She was offered a laparoscopy for potential care.   She has a normal pelvic US on 07/02/22 in Epic.   Considering future child bearing.  No hx fertility issues.  Using Nexplanon for pregnancy prevention and likes this method.  Laparoscopic robotic cholecystectomy on 03/05/2022.   Cesarean section for breech presentation and pre-eclampsia on 03/05/2021.  No endometriosis no adhesions seen.  Dx laparoscopy with cul de sac peritoneal biopsy for chronic pelvic pain 12/25/15.  No evidence of endometriosis at the time of surgery. Final pathology report showed unremarkable mesothelial lined fibroadipose tissue.  Review of op notes and peritoneal biopsy done in Epic.   PCP:   Sallyanne Havers, PA  No LMP recorded. Patient has had an implant.           Sexually active: Yes.    The current method of family planning is implant.    Exercising: Yes.     gym Smoker:  former  Health Maintenance: Pap:  03/18/22 ASCUS: HR  HPV neg, 05/11/21 neg History of abnormal Pap:  yes MMG:  n/a Colonoscopy:  n/a BMD:   n/a  Result  n/a TDaP:  02/06/21 Gardasil:   unsure, thinks so HIV: 01/19/21 NR Hep C: n/a   reports that she has quit smoking. Her smoking use included cigarettes. She has never used smokeless tobacco. She reports current alcohol use. She reports that she does not use drugs.  Past Medical History:  Diagnosis Date   Anxiety     Charcot-Marie disease    AFFECTS MOSTLY HER LEGS-CAUSES CRAMPS AND RESTLESS LEG-TAKES GABAPENTIN AT NIGHT WHICH SEEMS TO HELP   Endometriosis determined by laparoscopy    Irritable bowel syndrome    Major depressive disorder    Migraines    Preeclampsia    S/P cesarean section 04/01/2021    Past Surgical History:  Procedure Laterality Date   CESAREAN SECTION  03/25/2021   Procedure: CESAREAN SECTION;  Surgeon: Nadara Mustard, MD;  Location: [facility];  Service: Obstetrics;;   CHOLECYSTECTOMY  2023   LAPAROSCOPY N/A 12/25/2015   Procedure: LAPAROSCOPY DIAGNOSTIC;  Surgeon: Conard Novak, MD;  Location: [facility];  Service: Gynecology;  Laterality: N/A;   LEEP N/A 03/18/2020   Procedure: LOOP ELECTROSURGICAL EXCISION PROCEDURE (LEEP);  Surgeon: Conard Novak, MD;  Location: [facility] OR[facility];  Service: Gynecology;  Laterality: N/A;    Current Outpatient Medications  Medication Sig Dispense Refill   etonogestrel (NEXPLANON) 68 MG IMPL implant 1 each by Subdermal route once.     gabapentin (NEURONTIN) 300 MG capsule Take by mouth.     hydrOXYzine (ATARAX) 10 MG tablet Take by mouth.     No current facility-administered medications for this visit.    Family History  Problem Relation Age of Onset   Charcot-Marie-Tooth disease Father    Charcot-Marie-Tooth disease Sister    Heart disease  Maternal Grandmother    Hyperlipidemia Maternal Grandmother    Heart disease Maternal Grandfather     Review of Systems  All other systems reviewed and are negative.   Exam:   BP 124/82 (BP Location: Right Arm, Patient Position: Sitting, Cuff Size: Normal)   Pulse 73   Ht 5' 4.5" (1.638 m)   Wt 198 lb (89.8 kg)   SpO2 98%   BMI 33.46 kg/m     General appearance: alert, cooperative and appears stated age Hear:  S1S2 RRR.  Lungs:  clear to auscultation bilaterally.  Abdomen: soft, non-tender; no masses, no organomegaly Inguinal nodes:  normal.  Pelvic: External genitalia:  no lesions               No abnormal inguinal nodes palpated.              Urethra:  normal appearing urethra with no masses, tenderness or lesions              Bartholins and Skenes: normal                 Vagina: normal appearing vagina with normal color and discharge, no lesions              Cervix: no lesions Bimanual Exam:  Uterus:  normal size, contour, position, consistency, mobility, non-tender              Adnexa: no mass, fullness, tenderness              Rectal exam: yes.  Confirms.              Anus:  normal sphincter tone, no lesions  Chaperone was present for exam:  Warren Lacy, CMA  Assessment:   Chronic pelvic pain, mostly RLQ.   Uncertain etiology.  Possible fibrosis, endometriosis, adenomyosis, pelvic floor pain.  Status post Cesarean section, robotic cholecystectomy, laparoscopy with cul de sac peritoneal biopsy, negative for endometriosis.   Dyspareunia.  Hx migraines.  Use of Nexplanon. Hx LEEP for CIN 2/3.  Plan:  We discussed pelvic pain and possible treatment options for pelvic pain:  Mirena IUD, Orilissa, surgical evaluation and treatment, pelvic floor therapy.  We reviewed risks and benefits of laparoscopy:  bleeding, infection, damage to surrounding organs, reaction to anesthesia, DVT/PE, death, need for reoperation and additional treatment following surgery. Will get a copy of her recent pelvic ultrasound and records from [facility].  She agreed to testing today for GC/CT/trich. Return for follow up visit in 2 - 3 weeks.    After visit summary provided.

## 2023-04-28 ENCOUNTER — Other Ambulatory Visit (HOSPITAL_COMMUNITY)
Admission: RE | Admit: 2023-04-28 | Discharge: 2023-04-28 | Disposition: A | Discharge status: Home / Self Care | Payer: Self-pay | Source: Ambulatory Visit | Attending: Obstetrics and Gynecology | Admitting: Obstetrics and Gynecology

## 2023-04-28 ENCOUNTER — Ambulatory Visit: Payer: 59 | Admitting: Obstetrics and Gynecology

## 2023-04-28 ENCOUNTER — Encounter: Payer: Self-pay | Admitting: Obstetrics and Gynecology

## 2023-04-28 VITALS — BP 124/82 | HR 73 | Ht 64.5 in | Wt 198.0 lb

## 2023-04-28 DIAGNOSIS — R102 Pelvic and perineal pain: Secondary | ICD-10-CM | POA: Insufficient documentation

## 2023-04-28 NOTE — Patient Instructions (Signed)
Elagolix Tablets What is this medication? ELAGOLIX (el a GOE lix) treats pain caused by endometriosis. It works by decreasing the amount of estrogen and other hormones your body makes. This medicine may be used for other purposes; ask your health care provider or pharmacist if you have questions. COMMON BRAND NAME(S): Dewayne Hatch What should I tell my care team before I take this medication? They need to know if you have any of these conditions: Depression Liver disease Mental health conditions Osteoporosis, weak bones Suicidal thoughts, plans, or attempt by you or a family member An unusual or allergic reaction to elagolix, other medications, foods, dyes, or preservatives Pregnant or trying to get pregnant Breast-feeding How should I use this medication? Take this medication by mouth with a full glass of water. Take it as directed on the prescription label at the same time every day. You can take it with or without food. If it upsets your stomach, take it with food. Keep taking it unless your care team tells you to stop. A special MedGuide will be given to you by the pharmacist with each prescription and refill. Be sure to read this information carefully each time. Talk to your care team about the use of this medication in children. This medication is not approved for use in children. Overdosage: If you think you have taken too much of this medicine contact a poison control center or emergency room at once. NOTE: This medicine is only for you. Do not share this medicine with others. What if I miss a dose? If you miss a dose, take it as soon as you can. If it is almost time for your next dose, take only that dose. Do not take double or extra doses. What may interact with this medication? Do not take this medication with any of the following: Cyclosporine Enasidenib Gemfibrozil Trofinetide This medication may also interact with the following: Certain antivirals for HIV or  hepatitis Citalopram Digoxin Estrogen or progestin hormones Methadone Midazolam Omeprazole Rifampin Rosuvastatin This list may not describe all possible interactions. Give your health care provider a list of all the medicines, herbs, non-prescription drugs, or dietary supplements you use. Also tell them if you smoke, drink alcohol, or use illegal drugs. Some items may interact with your medicine. What should I watch for while using this medication? Visit your care team for regular checks on your progress. This medication may cause weak bones (osteoporosis). Only use this product for the amount of time your care team tells you to. The longer you use this product the more likely you will be at risk for weak bones. Ask your care team how you can keep strong bones. Patients and their families should watch out for new or worsening depression or thoughts of suicide. Also watch out for sudden changes in feelings such as feeling anxious, agitated, panicky, irritable, hostile, aggressive, impulsive, severely restless, overly excited and hyperactive, or not being able to sleep. If this happens, call your care team. You may have a change in bleeding pattern or irregular periods. Many patients stop having periods while taking this medication. Talk to your care team if you wish to become pregnant or think you might be pregnant. This medication can cause serious birth defects. Estrogen and/or progestin hormones may not work as well while you are taking this medication. If you are using estrogen and/or progestin hormones for contraception, a barrier contraceptive, such as a condom or diaphragm, is recommended during and for 28 days after stopping treatment. Talk to your  care team about other forms of contraception. What side effects may I notice from receiving this medication? Side effects that you should report to your care team as soon as possible: Allergic reactions--skin rash, itching, hives, swelling of the  face, lips, tongue, or throat Liver injury--right upper belly pain, loss of appetite, nausea, light-colored stool, dark yellow or brown urine, yellowing skin or eyes, unusual weakness or fatigue Thoughts of suicide or self-harm, worsening mood, feelings of depression Side effects that usually do not require medical attention (report these to your care team if they continue or are bothersome): Anxiety, nervousness Headache Hot flashes Joint pain Nausea Trouble sleeping This list may not describe all possible side effects. Call your doctor for medical advice about side effects. You may report side effects to FDA at 1-800-FDA-1088. Where should I keep my medication? Keep out of the reach of children and pets. Store at room temperature between 20 and 25 degrees C (68 and 77 degrees F). Get rid of any unused medication after the expiration date. It is important to get rid of the medication as soon as you no longer need it, or it is expired. You can do this in two ways: Take the medication to a medication take-back program. Check with your pharmacy or law enforcement to find a location. If you cannot return the medication, follow the directions in the MedGuide. NOTE: This sheet is a summary. It may not cover all possible information. If you have questions about this medicine, talk to your doctor, pharmacist, or health care provider.  2024 Elsevier/Gold Standard (2021-11-20 00:00:00)

## 2023-05-03 NOTE — Progress Notes (Signed)
GYNECOLOGY  VISIT   HPI: 32 y.o.   Married  Caucasian  female   [id number] with No LMP recorded. Patient has had an implant.   here for   3 week f/u-chronic pelvic pain. She has consistent pain in her right lower quadrant for over one year.  It is getting worse, especially at night.  No nausea or vomiting.  Regular bowel movements. Hx IBS.  Had normal [place] at [facility].   Hx anxiety and depression and has concerns about using [place].   Considering future childbearing and no specific timeline.  Possibly in the next 1 - 2 years.   No menses with her Nexplanon.   Denies blood in her urine or in the stool.   GYNECOLOGIC HISTORY: No LMP recorded. Patient has had an implant. Contraception:  nexplanon Menopausal hormone therapy:  n/a Last mammogram:  n/a Last pap smear:   03/18/22 ASCUS: HR HPV neg, 05/11/21 neg         OB History     Gravida  3   Para  2   Term  0   Preterm  2   AB  1   Living  2      SAB  0   IAB  0   Ectopic  0   Multiple  0   Live Births  2              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    Past Medical History:  Diagnosis Date   Anxiety    Charcot-Marie disease    AFFECTS MOSTLY HER LEGS-CAUSES CRAMPS AND RESTLESS LEG-TAKES GABAPENTIN AT NIGHT WHICH SEEMS TO HELP   Endometriosis determined by laparoscopy    Irritable bowel syndrome    Major depressive disorder    Migraines    no aura   Preeclampsia    S/P cesarean section 04/01/2021    Past Surgical History:  Procedure Laterality Date   CESAREAN SECTION  03/25/2021    Procedure: CESAREAN SECTION;  Surgeon: Nadara Mustard, MD;  Location: [facility] ORS;  Service: Obstetrics;;   CHOLECYSTECTOMY  2023   LAPAROSCOPY N/A 12/25/2015   Procedure: LAPAROSCOPY DIAGNOSTIC;  Surgeon: Conard Novak, MD;  Location: [facility] ORS;  Service: Gynecology;  Laterality: N/A;   LEEP N/A 03/18/2020   Procedure: LOOP ELECTROSURGICAL EXCISION PROCEDURE (LEEP);  Surgeon: Conard Novak, MD;  Location: [facility] ORS;  Service: Gynecology;  Laterality: N/A;    Current Outpatient Medications  Medication Sig Dispense Refill   etonogestrel (NEXPLANON) 68 MG IMPL implant 1 each by Subdermal route once.     gabapentin (NEURONTIN) 300 MG capsule Take by mouth.     hydrOXYzine (ATARAX) 10 MG tablet Take by mouth.     No current facility-administered medications for this visit.     ALLERGIES: Citalopram, Sulfa antibiotics, and Sulfamethoxazole-trimethoprim  Family History  Problem Relation Age of Onset   Charcot-Marie-Tooth disease Father    Charcot-Marie-Tooth disease Sister    Heart disease Maternal Grandmother    Hyperlipidemia Maternal Grandmother    Heart disease Maternal Grandfather     Social History   Socioeconomic History  Marital status: Married    Spouse name: Gerilyn Pilgrim   Number of children: 2   Years of education: Not on file   Highest education level: Not on file  Occupational History   Not on file  Tobacco Use   Smoking status: Former    Types: Cigarettes   Smokeless tobacco: Never  Vaping Use   Vaping status: Never Used  Substance and Sexual Activity   Alcohol use: Yes    Comment: rarely   Drug use: No   Sexual activity: Yes    Birth control/protection: Implant  Other Topics Concern   Not on file  Social History Narrative   Not on file   Social Determinants of Health   Financial Resource Strain: Medium Risk (04/14/2023)   Received from [facility]   Overall Financial Resource Strain (CARDIA)    Difficulty of Paying Living Expenses:  Somewhat hard  Food Insecurity: No Food Insecurity (04/14/2023)   Received from [facility]   Hunger Vital Sign    Worried About Running Out of Food in the Last Year: Never true    Ran Out of Food in the Last Year: Never true  Transportation Needs: No Transportation Needs (04/14/2023)   Received from [facility] - Transportation    In the past 12 months, has lack of transportation kept you from medical appointments or from getting medications?: No    Lack of Transportation (Non-Medical): No  Physical Activity: Not on file  Stress: Not on file  Social Connections: Not on file  Intimate Partner Violence: Not on file    Review of Systems  All other systems reviewed and are negative.   PHYSICAL EXAMINATION:    BP 124/82 (BP Location: Right Arm, Patient Position: Sitting, Cuff Size: Normal)   Pulse 69   Ht 5' 4.5" (1.638 m)   Wt 198 lb (89.8 kg)   SpO2 98%   BMI 33.46 kg/m     General appearance: alert, cooperative and appears stated age   ASSESSMENT  Chronic RLQ pain.   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  Nexplanon patient.   Has amenorrhea.  Hx multiple prior abdominal surgeries.   Desire for future fertility.   PLAN  Proceed with pelvic US. Declines rx for Ibuprofen.  We reviewed Vashti Hey IUD, and surgical consultation.  She is considering surgical consultation for evaluation and treatment after [place] complete.

## 2023-05-05 LAB — CERVICOVAGINAL ANCILLARY ONLY
Chlamydia: NEGATIVE
Comment: NEGATIVE
Comment: NEGATIVE
Comment: NORMAL
Neisseria Gonorrhea: NEGATIVE
Trichomonas: NEGATIVE

## 2023-05-17 ENCOUNTER — Ambulatory Visit: Payer: 59 | Admitting: Obstetrics and Gynecology

## 2023-05-17 ENCOUNTER — Telehealth: Payer: Self-pay | Admitting: Obstetrics and Gynecology

## 2023-05-17 ENCOUNTER — Encounter: Payer: Self-pay | Admitting: Obstetrics and Gynecology

## 2023-05-17 VITALS — BP 124/82 | HR 69 | Ht 64.5 in | Wt 198.0 lb

## 2023-05-17 DIAGNOSIS — R1031 Right lower quadrant pain: Secondary | ICD-10-CM

## 2023-05-17 NOTE — Telephone Encounter (Signed)
PUS order placed.  MyChart message sent notifying patient.

## 2023-05-17 NOTE — Telephone Encounter (Signed)
Please schedule pelvic ultrasound for my patient.   She has chronic right lower quadrant pain.

## 2023-05-17 NOTE — Telephone Encounter (Signed)
Last read by Blair Hailey Soper at  6:03 PM on 05/17/2023.   Routing to Dr. Marjorie Smolder.   Encounter closed.

## 2023-05-25 ENCOUNTER — Ambulatory Visit: Payer: 59

## 2023-05-25 ENCOUNTER — Encounter: Payer: Self-pay | Admitting: Obstetrics and Gynecology

## 2023-05-25 NOTE — Telephone Encounter (Signed)
Patient was seen in office on 05/17/23 for RLQ pain.  Routing to Dr. Edward Jolly to review.

## 2023-05-26 NOTE — Telephone Encounter (Signed)
PUS order cancelled.   Routing to Dr. Michela Pitcher.   Encounter closed.

## 2023-06-08 ENCOUNTER — Ambulatory Visit: Payer: 59 | Admitting: Obstetrics and Gynecology

## 2023-06-08 ENCOUNTER — Encounter: Payer: Self-pay | Admitting: Obstetrics and Gynecology

## 2023-06-08 VITALS — BP 112/80 | HR 75 | Wt 197.0 lb

## 2023-06-08 DIAGNOSIS — N809 Endometriosis, unspecified: Secondary | ICD-10-CM

## 2023-06-08 DIAGNOSIS — N8003 Adenomyosis of the uterus: Secondary | ICD-10-CM | POA: Diagnosis not present

## 2023-06-08 DIAGNOSIS — R102 Pelvic and perineal pain: Secondary | ICD-10-CM | POA: Diagnosis not present

## 2023-06-08 NOTE — Progress Notes (Signed)
Acute Office Visit consult for likely recurrent endometriosis/pelvic pain  Subjective:    Patient ID: Autumn Davies, female    DOB: [birth year], 32 y.o.   MRN: [id number]   HPI 32 y.o. presents today for surgical consult (Surgical consult//jj/Possible adhesive disease, had multiple prior abdominal surgeries./Pelvic u/s 07-02-22) . History of operative laparoscopy 5-7 years ago.  Remembers it was beginning stage of endometriosis. Pain started over a year ago and is stabbing in nature at the same area on the right lateral suprapubic region.  Heat helps. She states the pain is daily. She denies any dysuria, GI/GU complaints. No muscular pain Pain is reproduced with deep intercourse on the right side. Has the nexplanon for pelvic pain/endometriosis suppression and birth control. She only has occasional spotting here and there with it.  Is undecided about more children. She is G2P2 and one was a LTCS for breech. H/o PreE with both.   US PELVIS TRANSVAGINAL NON-OB (TV ONLY) (Accession [id number]) (Order [id number]) Imaging Date: 07/02/2022 Department: [facility] Released By: Kathi Ludwig Authorizing: [place] Records, PA-C   Exam Status  Status  Final [99]   PACS Intelerad Image Link   Show images for US PELVIS TRANSVAGINAL NON-OB (TV ONLY) Study Result  Narrative & Impression  Patient Name: Autumn Davies DOB: 1991-01-20 MRN: [id number] LMP: 06/28/2022   ULTRASOUND REPORT   Location: [facility] Date of Service: 07/02/2022        Indications:Pelvic Pain Findings:  The uterus is anteverted and measures 6.7 x 5.4 x 3.9cm. Echo texture is homogenous without evidence of focal masses.   The Endometrium measures 2.7 mm.   Right Ovary measures 3.7 x 2.8 x 2.1 cm. It is normal in appearance with a dominant follicle measuring 2.7 x 1.6cm Left Ovary measures 2.2 x 2.8 x 2.1 cm. It is normal in appearance. Survey of the adnexa demonstrates no  adnexal masses. There is no free fluid in the cul de sac.   Impression: 1. Dominant follicle on the right ovary (pt on Nexplanon), otherwise, normal pelvic ultrasound   Recommendations: 1.Clinical correlation with the patient's History and Physical Exam.     Willette Alma, RDMS, RVT     I have reviewed this study and agree with documented findings.      Hildred Laser, MD [facility]    05/25/23 outside [place] with likely adenomyosis  No LMP recorded. Patient has had an implant. Period Pattern:  (no bleeding with nexplanon)  Review of Systems     Objective:    OBGyn Exam  BP 112/80   Pulse 75   Wt 197 lb (89.4 kg)   SpO2 98%   BMI 33.29 kg/m  Wt Readings from Last 3 Encounters:  06/08/23 197 lb (89.4 kg)  05/17/23 198 lb (89.8 kg)  04/28/23 198 lb (89.8 kg)        Patient informed chaperone available to be present for breast and/or pelvic exam. Patient has requested no chaperone to be present. Patient has been advised what will be completed during breast and pelvic exam.   Assessment & Plan:  Pelvic pain -     Ambulatory Referral For Surgery Scheduling  Endometriosis -     Ambulatory Referral For Surgery Scheduling  Adenomyosis -     Ambulatory Referral For Surgery Scheduling    All options discussed. She desires her fertility at this time. She would like to keep nexplanon in and have another diagnostic/operative laparoscopy with the  robot.  The procedure was discussed in detail with r/b/a/I. Discussed option for peritoneal stripping to help remove the surrounding peritoneum and remove any microscopy lesions and improve chances of recurrence and pain.  Discussed excision of safe lesions as well with focus on the RLQ.  To inspect the appendix. If affected, may need additional surgery, if there is no general surgeon available. She would like to have this.  To flush the tubes as well with chromopertubation due to the risk of obstruction with  endometriosis.  30 minutes spent on reviewing records, imaging,  and one on one patient time and counseling patient and documentation Dr. Judith Blonder

## 2023-06-16 ENCOUNTER — Encounter: Payer: Self-pay | Admitting: Obstetrics and Gynecology

## 2023-06-16 DIAGNOSIS — N809 Endometriosis, unspecified: Secondary | ICD-10-CM

## 2023-06-16 DIAGNOSIS — N921 Excessive and frequent menstruation with irregular cycle: Secondary | ICD-10-CM

## 2023-06-21 ENCOUNTER — Encounter: Payer: Self-pay | Admitting: Obstetrics and Gynecology

## 2023-06-21 MED ORDER — TRANEXAMIC ACID 650 MG PO TABS
1300.0000 mg | ORAL_TABLET | Freq: Three times a day (TID) | ORAL | 0 refills | Status: DC
Start: 2023-06-21 — End: 2023-08-18

## 2023-06-21 NOTE — Telephone Encounter (Signed)
"  Can start the motrin and alternate with tylenol  Maybe even txa when she starts her period? Dr. Karma Greaser"

## 2023-06-21 NOTE — Telephone Encounter (Signed)
Per mychart msg on 10/22-pt states been bleeding since 06/13/2023. Ok to send rx for lysteda? Rx pend.

## 2023-06-27 NOTE — Telephone Encounter (Signed)
Spoke with patient.  Offered next available date in 08/2023, patient declined. Patient states she should not have to wait that long for surgery or return call. Patient expressed frustration with this process and length of time of return call. Explained surgery scheduling process, scheduling in priority and then order surgeries received. Advised if she is calling and leaving message on surgery line, option 5, I have not received any. Advised concerns forwarded to practice admin and will forward decision to not schedule to provider. Patient ended call.   Routing to Dr. Michela Pitcher.   Cc: Miranda M.

## 2023-06-27 NOTE — Telephone Encounter (Signed)
See MyChart encounter dated 06/24/23.   Encounter closed.

## 2023-08-01 ENCOUNTER — Other Ambulatory Visit: Payer: Self-pay | Admitting: Obstetrics and Gynecology

## 2023-08-01 NOTE — H&P (Signed)
Preoperative History and Physical  Chief Complaint: Autumn Davies is a 32 y.o. [id number] here for surgical management of chronic pelvic pain in a female.   No significant preoperative concerns.  History of Present Illness: 32 y.o. [name] female who presents to discuss options for pain control with a history of endometriosis. She has a long history of pelvic pain.  She had a diagnostic laparoscopy in 2017 that was unremarkable.  There was a biopsy that was negative.   Her current pain started over a year ago and is located primarily on her right side. The pain used to come and go until about 6 months ago. At that point the pain was coming every day, but not lasting all day.  Now the pain is lasting all day and happening every day.  This has been going on for about 3 weeks.   She has a Nexplanon that was placed about 2 years ago.  She has occasional bleeding and spotting with her Nexplanon. She did have a period that lasted about 8 days that stopped without intervention.   She also has a history of c-section for breech (G2). She has a history of preeclampsia with both.     She had an ultrasound on 06/2022 that was essentially negative.    05/25/2023 outside ultrasound with likely adenomyosis.  I reviewed the results from the patient's phone and the images were scanned and of poor quality.  Myometrium called heterogeneous and there was possible adenomyosis. I can't tell from these images.     She has a history of a LEEP in 2021.  Her last pap smear was 12/2022 and was NILM (No HPV reported) 02/2022: ASCUS, HPV negative 04/2021: NILM 07/2020: ASCUS  LEEP 02/2020 - in ectocervix showed only focal residual HSIL (no CIN comment)   Denies GI/GU symptoms.  She tried norethindrone and it did help for a couple of days, then her pain returned. She went to a dose as high as 15 mg, and this didn't work.  So, she stopped the medication (she also developed headaches, which she isn't sure is related to the  medication).  She has not restarted the medication.   Proposed surgery: robot assisted diagnostic laparoscopy  Past Medical History:  Diagnosis Date   Anxiety    Charcot-Marie-Tooth disease    Constipation    Depression    Preeclampsia (HHS-HCC) 12/19/2011   with induced vaginal delivery   Rectal pain    Restless legs    Past Surgical History:  Procedure Laterality Date   COLONOSCOPY  11/15/2012   per Dr. Cecelia Byars, results pending   EGD  11/15/2012   per Dr. Cecelia Byars, results pending   EGD  04/03/2018   Normal EGD /No Repeat/TKT   COLONOSCOPY  06/19/2019   Negative colon biopsies/Repeat at age 89/ in 89yrs/TKT   ROBOT ASSISTED LAPAROSCOPIC CHOLECYSTECTOMY  03/05/2022   Dr Arrie Senate   CERVICAL BIOPSY  W/ LOOP ELECTRODE EXCISION  2021   CESAREAN SECTION  03/25/2021   CHOLECYSTECTOMY  2023   COLONOSCOPY  2014 ???   [facility] - nml per pt.   UPPER GASTROINTESTINAL ENDOSCOPY  2014 ??   [facility] - Nml per patient   OB History  Gravida Para Term Preterm AB Living  3 2   2 1 2   SAB IAB Ectopic Molar Multiple Live Births            2    # Outcome Date GA Lbr Len/2nd Weight Sex Type Anes  PTL Lv  3 Preterm 03/25/21 [redacted]w[redacted]d  3 kg (6 lb 9.8 oz) M CS-LTranv Spinal  LIV  2 Preterm 12/19/11 [redacted]w[redacted]d   F Vag-Spont   LIV  1 AB           Patient denies any other pertinent gynecologic issues.   Current Outpatient Medications on File Prior to Visit  Medication Sig Dispense Refill   etonogestreL (NEXPLANON) 68 mg implant Inject 1 each into the skin once     gabapentin (NEURONTIN) 300 MG capsule Take 3-4 capsules (900-1,200 mg total) by mouth at bedtime Take 1200mg  nightly. 360 capsule 3   hydrOXYzine HCL (ATARAX) 10 MG tablet Take 1 tablet (10 mg total) by mouth 3 (three) times daily as needed for Anxiety 30 tablet 11   norethindrone (AYGESTIN) 5 mg tablet Take 1 tablet (5 mg total) by mouth once daily 90 tablet 3   albuterol 90 mcg/actuation inhaler Inhale 2 inhalations  into the lungs every 4 (four) hours as needed (Patient not taking: Reported on 03/21/2023) 1 each 1   colestipoL (COLESTID) 1 gram tablet Take 2 tablets (2 g total) by mouth 2 (two) times daily (Patient not taking: Reported on 08/01/2023) 120 tablet 3   No current facility-administered medications on file prior to visit.   Allergies  Allergen Reactions   Celexa [Citalopram] Headache   Sulfa (Sulfonamide Antibiotics) Rash    Red rash all over body    Social History:   reports that she quit smoking about 12 years ago. Her smoking use included cigarettes. She started smoking about 12 years ago. She has a 0.1 pack-year smoking history. She has never used smokeless tobacco. She reports that she does not drink alcohol and does not use drugs.  Family History  Problem Relation Name Age of Onset   High blood pressure (Hypertension) Maternal Grandfather Dean    Coronary Artery Disease (Blocked arteries around heart) Maternal Grandfather Dean    Diabetes type II Maternal Grandfather Dean    Diabetes Maternal Grandfather Dean    Diabetes type II Paternal Grandmother     Charcot-Marie-Tooth disease Father Harvie Heck    Colon cancer Father Harvie Heck    Cancer Father Harvie Heck        Colorectal Cancer   Colon polyps Maternal Grandmother Chyrl Civatte    Diabetes Maternal Grandmother JoAnn    Colon cancer Sister      Review of Systems: Noncontributory  PHYSICAL EXAM: Blood pressure 103/72. CONSTITUTIONAL: Well-developed, well-nourished female in no acute distress.  HENT:  Normocephalic, atraumatic, External right and left ear normal. Oropharynx is clear and moist EYES: Conjunctivae and EOM are normal. Pupils are equal, round, and reactive to light. No scleral icterus.  NECK: Normal range of motion, supple, no masses SKIN: Skin is warm and dry. No rash noted. Not diaphoretic. No erythema. No pallor. NEUROLGIC: Alert and oriented to person, place, and time. Normal reflexes, muscle tone coordination. No cranial nerve  deficit noted. PSYCHIATRIC: Normal mood and affect. Normal behavior. Normal judgment and thought content. CARDIOVASCULAR: Normal heart rate noted, regular rhythm RESPIRATORY: Effort and breath sounds normal, no problems with respiration noted ABDOMEN: Soft, nontender, nondistended. PELVIC: Deferred MUSCULOSKELETAL: Normal range of motion. No edema and no tenderness. 2+ distal pulses.  Labs: No results found for this or any previous visit (from the past 2 weeks).  Imaging Studies: No results found.  Assessment: 1. Chronic pelvic pain in female      Plan: Patient will undergo surgical management with the above-noted surgery.  The risks of surgery were discussed in detail with the patient including but not limited to: bleeding which may require transfusion or reoperation; infection which may require antibiotics; injury to surrounding organs which may involve bowel, bladder, ureters ; need for additional procedures including laparoscopy or laparotomy; thromboembolic phenomenon, surgical site problems and other postoperative/anesthesia complications. Likelihood of success in alleviating the patient's condition was discussed. Routine postoperative instructions will be reviewed with the patient and her family in detail after surgery.  The patient concurred with the proposed plan, giving informed written consent for the surgery.   Preoperative prophylactic antibiotics, as indicated, and SCDs ordered on call to the OR.     Attestation Statement:   I personally performed the service. (TP)  Lorina Duffner Teola Bradley, MD  [facility] 08/01/2023 11:06 AM

## 2023-08-10 ENCOUNTER — Other Ambulatory Visit: Payer: Self-pay

## 2023-08-10 ENCOUNTER — Encounter
Admission: RE | Admit: 2023-08-10 | Discharge: 2023-08-10 | Disposition: A | Discharge status: Home / Self Care | Payer: 59 | Source: Ambulatory Visit | Attending: Obstetrics and Gynecology | Admitting: Obstetrics and Gynecology

## 2023-08-10 DIAGNOSIS — Z01812 Encounter for preprocedural laboratory examination: Secondary | ICD-10-CM

## 2023-08-10 NOTE — Patient Instructions (Addendum)
Your procedure is scheduled on: 08/18/23 - Thursday Report to the Registration Desk on the 1st floor of the [facility]. To find out your arrival time, please call [phone] between 1PM - 3PM on: 08/17/23 - Wednesday If your arrival time is 6:00 am, do not arrive before that time as the [facility] entrance doors do not open until 6:00 am.  REMEMBER: Instructions that are not followed completely may result in serious medical risk, up to and including death; or upon the discretion of your surgeon and anesthesiologist your surgery may need to be rescheduled.  Do not eat food after midnight the night before surgery.  No gum chewing or hard candies.  You may however, drink CLEAR liquids up to 2 hours before you are scheduled to arrive for your surgery. Do not drink anything within 2 hours of your scheduled arrival time.  Clear liquids include: - water  - apple juice without pulp - gatorade (not RED colors) - black coffee or tea (Do NOT add milk or creamers to the coffee or tea) Do NOT drink anything that is not on this list.  In addition, your doctor has ordered for you to drink the provided:  Ensure Pre-Surgery Clear Carbohydrate Drink  Drinking this carbohydrate drink up to two hours before surgery helps to reduce insulin resistance and improve patient outcomes. Please complete drinking 2 hours before scheduled arrival time.  One week prior to surgery: Stop Anti-inflammatories (NSAIDS) such as Advil, Aleve, Ibuprofen, Motrin, Naproxen, Naprosyn and Aspirin based products such as Excedrin, Goody's Powder, BC Powder. You may take Tylenol if needed for pain up until the day of surgery.  Stop ANY OVER THE COUNTER supplements until after surgery : Vitamin D   ON THE DAY OF SURGERY ONLY TAKE THESE MEDICATIONS WITH SIPS OF WATER:  norethindrone (AYGESTIN)   No Alcohol for 24 hours before or after surgery.  No Smoking including e-cigarettes for 24 hours before surgery.  No chewable  tobacco products for at least 6 hours before surgery.  No nicotine patches on the day of surgery.  Do not use any "recreational" drugs for at least a week (preferably 2 weeks) before your surgery.  Please be advised that the combination of cocaine and anesthesia may have negative outcomes, up to and including death. If you test positive for cocaine, your surgery will be cancelled.  On the morning of surgery brush your teeth with toothpaste and water, you may rinse your mouth with mouthwash if you wish. Do not swallow any toothpaste or mouthwash.  Use CHG Soap or wipes as directed on instruction sheet.  Do not wear jewelry, make-up, hairpins, clips or nail polish.  For welded (permanent) jewelry: bracelets, anklets, waist bands, etc.  Please have this removed prior to surgery.  If it is not removed, there is a chance that hospital personnel will need to cut it off on the day of surgery.  Do not wear lotions, powders, or perfumes.   Do not shave body hair from the neck down 48 hours before surgery.  Contact lenses, hearing aids and dentures may not be worn into surgery.  Do not bring valuables to the hospital. [facility] is not responsible for any missing/lost belongings or valuables.   Notify your doctor if there is any change in your medical condition (cold, fever, infection).  Wear comfortable clothing (specific to your surgery type) to the hospital.  After surgery, you can help prevent lung complications by doing breathing exercises.  Take deep breaths  and cough every 1-2 hours. Your doctor may order a device called an Incentive Spirometer to help you take deep breaths. When coughing or sneezing, hold a pillow firmly against your incision with both hands. This is called "splinting." Doing this helps protect your incision. It also decreases belly discomfort.  If you are being admitted to the hospital overnight, leave your suitcase in the car. After surgery it may be brought to your  room.  In case of increased patient census, it may be necessary for you, the patient, to continue your postoperative care in the Same Day Surgery department.  If you are being discharged the day of surgery, you will not be allowed to drive home. You will need a responsible individual to drive you home and stay with you for 24 hours after surgery.   If you are taking public transportation, you will need to have a responsible individual with you.  Please call the Pre-admissions Testing Dept. at [phone] if you have any questions about these instructions.  Surgery Visitation Policy:  Patients having surgery or a procedure may have two visitors.  Children under the age of 24 must have an adult with them who is not the patient.  Inpatient Visitation:    Visiting hours are 7 a.m. to 8 p.m. Up to four visitors are allowed at one time in a patient room. The visitors may rotate out with other people during the day.  One visitor age 47 or older may stay with the patient overnight and must be in the room by 8 p.m.    Preparing for Surgery with CHLORHEXIDINE GLUCONATE (CHG) Soap  Chlorhexidine Gluconate (CHG) Soap  o An antiseptic cleaner that kills germs and bonds with the skin to continue killing germs even after washing  o Used for showering the night before surgery and morning of surgery  Before surgery, you can play an important role by reducing the number of germs on your skin.  CHG (Chlorhexidine gluconate) soap is an antiseptic cleanser which kills germs and bonds with the skin to continue killing germs even after washing.  Please do not use if you have an allergy to CHG or antibacterial soaps. If your skin becomes reddened/irritated stop using the CHG.  1. Shower the NIGHT BEFORE SURGERY and the MORNING OF SURGERY with CHG soap.  2. If you choose to wash your hair, wash your hair first as usual with your normal shampoo.  3. After shampooing, rinse your hair and body  thoroughly to remove the shampoo.  4. Use CHG as you would any other liquid soap. You can apply CHG directly to the skin and wash gently with a scrungie or a clean washcloth.  5. Apply the CHG soap to your body only from the neck down. Do not use on open wounds or open sores. Avoid contact with your eyes, ears, mouth, and genitals (private parts). Wash face and genitals (private parts) with your normal soap.  6. Wash thoroughly, paying special attention to the area where your surgery will be performed.  7. Thoroughly rinse your body with warm water.  8. Do not shower/wash with your normal soap after using and rinsing off the CHG soap.  9. Pat yourself dry with a clean towel.  10. Wear clean pajamas to bed the night before surgery.  12. Place clean sheets on your bed the night of your first shower and do not sleep with pets.  13. Shower again with the CHG soap on the day of surgery  prior to arriving at the hospital.  14. Do not apply any deodorants/lotions/powders.  15. Please wear clean clothes to the hospital.

## 2023-08-11 ENCOUNTER — Encounter
Admission: RE | Admit: 2023-08-11 | Discharge: 2023-08-11 | Disposition: A | Discharge status: Home / Self Care | Payer: 59 | Source: Ambulatory Visit | Attending: Obstetrics and Gynecology | Admitting: Obstetrics and Gynecology

## 2023-08-11 DIAGNOSIS — R102 Pelvic and perineal pain: Secondary | ICD-10-CM | POA: Diagnosis not present

## 2023-08-11 DIAGNOSIS — Z01812 Encounter for preprocedural laboratory examination: Secondary | ICD-10-CM | POA: Diagnosis present

## 2023-08-11 LAB — CBC
HCT: 42.5 % (ref 36.0–46.0)
Hemoglobin: 14.3 g/dL (ref 12.0–15.0)
MCH: 30.4 pg (ref 26.0–34.0)
MCHC: 33.6 g/dL (ref 30.0–36.0)
MCV: 90.4 fL (ref 80.0–100.0)
Platelets: 293 10*3/uL (ref 150–400)
RBC: 4.7 MIL/uL (ref 3.87–5.11)
RDW: 14.9 % (ref 11.5–15.5)
WBC: 8.9 10*3/uL (ref 4.0–10.5)
nRBC: 0 % (ref 0.0–0.2)

## 2023-08-11 LAB — TYPE AND SCREEN
ABO/RH(D): O POS
Antibody Screen: NEGATIVE

## 2023-08-17 MED ORDER — LACTATED RINGERS IV SOLN: INTRAVENOUS | Status: DC

## 2023-08-17 MED ORDER — ORAL CARE MOUTH RINSE: 15.0000 mL | Freq: Once | OROMUCOSAL | Status: AC

## 2023-08-17 MED ORDER — LACTATED RINGERS IV SOLN: INTRAVENOUS | Status: AC

## 2023-08-17 MED ORDER — CHLORHEXIDINE GLUCONATE 0.12 % MT SOLN
15.0000 mL | Freq: Once | OROMUCOSAL | Status: AC
  Administered 2023-08-18: 15 mL via OROMUCOSAL

## 2023-08-18 ENCOUNTER — Ambulatory Visit
Admission: RE | Admit: 2023-08-18 | Discharge: 2023-08-18 | Disposition: A | Discharge status: Home / Self Care | Payer: 59 | Attending: Obstetrics and Gynecology | Admitting: Obstetrics and Gynecology

## 2023-08-18 ENCOUNTER — Ambulatory Visit: Payer: 59 | Admitting: Certified Registered Nurse Anesthetist

## 2023-08-18 ENCOUNTER — Encounter: Payer: Self-pay | Admitting: Obstetrics and Gynecology

## 2023-08-18 ENCOUNTER — Other Ambulatory Visit: Payer: Self-pay

## 2023-08-18 ENCOUNTER — Encounter
Admission: RE | Disposition: A | Discharge status: Home / Self Care | Payer: Self-pay | Source: Home / Self Care | Attending: Obstetrics and Gynecology

## 2023-08-18 ENCOUNTER — Ambulatory Visit: Payer: 59 | Admitting: Urgent Care

## 2023-08-18 DIAGNOSIS — N809 Endometriosis, unspecified: Secondary | ICD-10-CM | POA: Diagnosis present

## 2023-08-18 DIAGNOSIS — Z87891 Personal history of nicotine dependence: Secondary | ICD-10-CM | POA: Insufficient documentation

## 2023-08-18 DIAGNOSIS — G8929 Other chronic pain: Secondary | ICD-10-CM | POA: Insufficient documentation

## 2023-08-18 DIAGNOSIS — R102 Pelvic and perineal pain: Secondary | ICD-10-CM | POA: Diagnosis present

## 2023-08-18 DIAGNOSIS — Z793 Long term (current) use of hormonal contraceptives: Secondary | ICD-10-CM | POA: Diagnosis not present

## 2023-08-18 DIAGNOSIS — N858 Other specified noninflammatory disorders of uterus: Secondary | ICD-10-CM | POA: Insufficient documentation

## 2023-08-18 HISTORY — PX: XI ROBOT ASSISTED DIAGNOSTIC LAPAROSCOPY: SHX6815

## 2023-08-18 LAB — POCT PREGNANCY, URINE: Preg Test, Ur: NEGATIVE

## 2023-08-18 SURGERY — LAPAROSCOPY, DIAGNOSTIC, ROBOT-ASSISTED
Anesthesia: General | Site: Abdomen

## 2023-08-18 MED ORDER — BUPIVACAINE HCL 0.5 % IJ SOLN
INTRAMUSCULAR | Status: DC | PRN
  Administered 2023-08-18: 8 mL

## 2023-08-18 MED ORDER — HYDROMORPHONE HCL 1 MG/ML IJ SOLN
INTRAMUSCULAR | Status: AC
  Filled 2023-08-18: qty 1

## 2023-08-18 MED ORDER — ONDANSETRON HCL 4 MG/2ML IJ SOLN
INTRAMUSCULAR | Status: DC | PRN
  Administered 2023-08-18 (×2): 4 mg via INTRAVENOUS

## 2023-08-18 MED ORDER — GLYCOPYRROLATE 0.2 MG/ML IJ SOLN
INTRAMUSCULAR | Status: AC
  Filled 2023-08-18: qty 1

## 2023-08-18 MED ORDER — HYDROCODONE-ACETAMINOPHEN 5-325 MG PO TABS: 1.0000 | ORAL_TABLET | Freq: Four times a day (QID) | ORAL | 0 refills | Status: AC | PRN

## 2023-08-18 MED ORDER — ONDANSETRON HCL 4 MG/2ML IJ SOLN
INTRAMUSCULAR | Status: AC
  Filled 2023-08-18: qty 2

## 2023-08-18 MED ORDER — MIDAZOLAM HCL 2 MG/2ML IJ SOLN
INTRAMUSCULAR | Status: AC
  Filled 2023-08-18: qty 2

## 2023-08-18 MED ORDER — ACETAMINOPHEN 10 MG/ML IV SOLN
INTRAVENOUS | Status: DC | PRN
  Administered 2023-08-18: 1000 mg via INTRAVENOUS

## 2023-08-18 MED ORDER — BUPIVACAINE HCL (PF) 0.5 % IJ SOLN
INTRAMUSCULAR | Status: AC
  Filled 2023-08-18: qty 30

## 2023-08-18 MED ORDER — PROPOFOL 10 MG/ML IV BOLUS
INTRAVENOUS | Status: AC
  Filled 2023-08-18: qty 20

## 2023-08-18 MED ORDER — DEXAMETHASONE SODIUM PHOSPHATE 10 MG/ML IJ SOLN
INTRAMUSCULAR | Status: DC | PRN
  Administered 2023-08-18: 4 mg via INTRAVENOUS

## 2023-08-18 MED ORDER — HYDROMORPHONE HCL 1 MG/ML IJ SOLN
0.2500 mg | INTRAMUSCULAR | Status: DC | PRN
Start: 2023-08-18 — End: 2023-08-19
  Administered 2023-08-18: 0.25 mg via INTRAVENOUS

## 2023-08-18 MED ORDER — SEVOFLURANE IN SOLN
RESPIRATORY_TRACT | Status: AC
  Filled 2023-08-18: qty 250

## 2023-08-18 MED ORDER — FENTANYL CITRATE (PF) 100 MCG/2ML IJ SOLN
INTRAMUSCULAR | Status: DC | PRN
  Administered 2023-08-18: 100 ug via INTRAVENOUS
  Administered 2023-08-18: 50 ug via INTRAVENOUS

## 2023-08-18 MED ORDER — OXYCODONE HCL 5 MG PO TABS
ORAL_TABLET | ORAL | Status: AC
  Filled 2023-08-18: qty 1

## 2023-08-18 MED ORDER — SUGAMMADEX SODIUM 200 MG/2ML IV SOLN
INTRAVENOUS | Status: DC | PRN
  Administered 2023-08-18: 200 mg via INTRAVENOUS

## 2023-08-18 MED ORDER — LIDOCAINE HCL (CARDIAC) PF 100 MG/5ML IV SOSY
PREFILLED_SYRINGE | INTRAVENOUS | Status: DC | PRN
  Administered 2023-08-18: 40 mg via INTRAVENOUS

## 2023-08-18 MED ORDER — IBUPROFEN 600 MG PO TABS: 600.0000 mg | ORAL_TABLET | Freq: Four times a day (QID) | ORAL | 0 refills | Status: AC

## 2023-08-18 MED ORDER — FENTANYL CITRATE (PF) 100 MCG/2ML IJ SOLN
INTRAMUSCULAR | Status: AC
  Filled 2023-08-18: qty 2

## 2023-08-18 MED ORDER — LIDOCAINE HCL (PF) 2 % IJ SOLN
INTRAMUSCULAR | Status: AC
  Filled 2023-08-18: qty 5

## 2023-08-18 MED ORDER — MIDAZOLAM HCL 2 MG/2ML IJ SOLN
INTRAMUSCULAR | Status: DC | PRN
  Administered 2023-08-18: 2 mg via INTRAVENOUS

## 2023-08-18 MED ORDER — PHENYLEPHRINE 80 MCG/ML (10ML) SYRINGE FOR IV PUSH (FOR BLOOD PRESSURE SUPPORT)
PREFILLED_SYRINGE | INTRAVENOUS | Status: AC
  Filled 2023-08-18: qty 10

## 2023-08-18 MED ORDER — OXYCODONE HCL 5 MG/5ML PO SOLN: 5.0000 mg | Freq: Once | ORAL | Status: AC | PRN

## 2023-08-18 MED ORDER — DEXMEDETOMIDINE HCL IN NACL 200 MCG/50ML IV SOLN
INTRAVENOUS | Status: DC | PRN
  Administered 2023-08-18 (×2): 8 ug via INTRAVENOUS
  Administered 2023-08-18: 4 ug via INTRAVENOUS

## 2023-08-18 MED ORDER — PHENYLEPHRINE 80 MCG/ML (10ML) SYRINGE FOR IV PUSH (FOR BLOOD PRESSURE SUPPORT)
PREFILLED_SYRINGE | INTRAVENOUS | Status: DC | PRN
  Administered 2023-08-18: 80 ug via INTRAVENOUS

## 2023-08-18 MED ORDER — DEXAMETHASONE SODIUM PHOSPHATE 10 MG/ML IJ SOLN
INTRAMUSCULAR | Status: AC
  Filled 2023-08-18: qty 1

## 2023-08-18 MED ORDER — PROPOFOL 10 MG/ML IV BOLUS
INTRAVENOUS | Status: DC | PRN
  Administered 2023-08-18: 200 mg via INTRAVENOUS

## 2023-08-18 MED ORDER — CHLORHEXIDINE GLUCONATE 0.12 % MT SOLN
OROMUCOSAL | Status: AC
  Filled 2023-08-18: qty 15

## 2023-08-18 MED ORDER — ACETAMINOPHEN 10 MG/ML IV SOLN
INTRAVENOUS | Status: AC
  Filled 2023-08-18: qty 100

## 2023-08-18 MED ORDER — ROCURONIUM BROMIDE 100 MG/10ML IV SOLN
INTRAVENOUS | Status: DC | PRN
  Administered 2023-08-18: 50 mg via INTRAVENOUS

## 2023-08-18 MED ORDER — OXYCODONE HCL 5 MG PO TABS
5.0000 mg | ORAL_TABLET | Freq: Once | ORAL | Status: AC | PRN
Start: 2023-08-18 — End: 2023-08-18
  Administered 2023-08-18: 5 mg via ORAL

## 2023-08-18 SURGICAL SUPPLY — 59 items
BAG URINE DRAIN 2000ML AR STRL (UROLOGICAL SUPPLIES) ×1 IMPLANT
BARRIER ADHS 3X4 INTERCEED (GAUZE/BANDAGES/DRESSINGS) IMPLANT
BLADE SURG SZ11 CARB STEEL (BLADE) ×1 IMPLANT
CATH URTH 16FR FL 2W BLN LF (CATHETERS) ×1 IMPLANT
CHLORAPREP W/TINT 26 (MISCELLANEOUS) ×1 IMPLANT
COVER TIP SHEARS 8 DVNC (MISCELLANEOUS) ×1 IMPLANT
COVER WAND RF STERILE (DRAPES) ×1 IMPLANT
DERMABOND ADVANCED .7 DNX12 (GAUZE/BANDAGES/DRESSINGS) ×1 IMPLANT
DRAPE 3/4 80X56 (DRAPES) ×1 IMPLANT
DRAPE ARM DVNC X/XI (DISPOSABLE) ×3 IMPLANT
DRAPE COLUMN DVNC XI (DISPOSABLE) ×1 IMPLANT
DRAPE LEGGINS SURG 28X43 STRL (DRAPES) ×1 IMPLANT
DRAPE SHEET LG 3/4 BI-LAMINATE (DRAPES) ×1 IMPLANT
DRAPE UNDER BUTTOCK W/FLU (DRAPES) ×1 IMPLANT
DRSG TELFA 3X8 NADH STRL (GAUZE/BANDAGES/DRESSINGS) IMPLANT
ELECT REM PT RETURN 9FT ADLT (ELECTROSURGICAL) ×1
ELECTRODE REM PT RTRN 9FT ADLT (ELECTROSURGICAL) ×1 IMPLANT
FORCEPS BPLR 8 MD DVNC XI (FORCEP) ×1 IMPLANT
FORCEPS BPLR R/ABLATION 8 DVNC (INSTRUMENTS) ×1 IMPLANT
GLOVE BIO SURGEON STRL SZ7 (GLOVE) ×2 IMPLANT
GLOVE BIOGEL PI IND STRL 7.5 (GLOVE) ×3 IMPLANT
GOWN STRL REUS W/ TWL LRG LVL3 (GOWN DISPOSABLE) ×3 IMPLANT
GRASPER SUT TROCAR 14GX15 (MISCELLANEOUS) IMPLANT
IRRIGATION STRYKERFLOW (MISCELLANEOUS) IMPLANT
IRRIGATOR STRYKERFLOW (MISCELLANEOUS)
IV NS 1000ML BAXH (IV SOLUTION) ×1 IMPLANT
KIT IMAGING PINPOINTPAQ (MISCELLANEOUS) IMPLANT
KIT PINK PAD W/HEAD ARE REST (MISCELLANEOUS) ×1
KIT PINK PAD W/HEAD ARM REST (MISCELLANEOUS) ×1 IMPLANT
LABEL OR SOLS (LABEL) ×1 IMPLANT
MANIFOLD NEPTUNE II (INSTRUMENTS) ×1 IMPLANT
MANIPULATOR UTERINE 4.5 ZUMI (MISCELLANEOUS) IMPLANT
NDL HYPO 22X1.5 SAFETY MO (MISCELLANEOUS) ×1 IMPLANT
NEEDLE HYPO 22X1.5 SAFETY MO (MISCELLANEOUS) ×1
NS IRRIG 1000ML POUR BTL (IV SOLUTION) ×2 IMPLANT
OBTURATOR OPTICAL STND 8 DVNC (TROCAR) ×1
OBTURATOR OPTICALSTD 8 DVNC (TROCAR) ×1 IMPLANT
PACK LAP CHOLECYSTECTOMY (MISCELLANEOUS) ×1 IMPLANT
PAD ARMBOARD 7.5X6 YLW CONV (MISCELLANEOUS) ×1 IMPLANT
PAD OB MATERNITY 4.3X12.25 (PERSONAL CARE ITEMS) ×1 IMPLANT
PAD PREP OB/GYN DISP 24X41 (PERSONAL CARE ITEMS) ×1 IMPLANT
SCISSORS METZENBAUM CVD 33 (INSTRUMENTS) IMPLANT
SCISSORS MNPLR CVD DVNC XI (INSTRUMENTS) ×1 IMPLANT
SCRUB CHG 4% DYNA-HEX 4OZ (MISCELLANEOUS) ×1 IMPLANT
SEAL UNIV 5-12 XI (MISCELLANEOUS) ×3 IMPLANT
SEALER VESSEL EXT DVNC XI (MISCELLANEOUS) IMPLANT
SOL ELECTROSURG ANTI STICK (MISCELLANEOUS) ×1
SOLUTION ELECTROSURG ANTI STCK (MISCELLANEOUS) ×1 IMPLANT
SPONGE T-LAP 18X18 ~~LOC~~+RFID (SPONGE) IMPLANT
SURGILUBE 2OZ TUBE FLIPTOP (MISCELLANEOUS) ×1 IMPLANT
SUT MNCRL 4-0 27XMFL (SUTURE) ×3
SUT VICRYL 0 UR6 27IN ABS (SUTURE) IMPLANT
SUTURE MNCRL 4-0 27XMF (SUTURE) ×1 IMPLANT
SYR 10ML LL (SYRINGE) ×1 IMPLANT
TRAP FLUID SMOKE EVACUATOR (MISCELLANEOUS) ×1 IMPLANT
TROCAR 12M 150ML BLUNT (TROCAR) IMPLANT
TROCAR Z-THREAD FIOS 12X100MM (TROCAR) IMPLANT
TUBING EVAC SMOKE HEATED PNEUM (TUBING) ×1 IMPLANT
WATER STERILE IRR 500ML POUR (IV SOLUTION) ×1 IMPLANT

## 2023-08-18 NOTE — Interval H&P Note (Signed)
History and Physical Interval Note:  08/18/2023 2:39 PM  Autumn Davies  has presented today for surgery, with the diagnosis of chronic pelvic pain.  The various methods of treatment have been discussed with the patient and family. After consideration of risks, benefits and other options for treatment, the patient has consented to  Procedure(s): XI ROBOT ASSISTED DIAGNOSTIC LAPAROSCOPY (N/A) as a surgical intervention.  The patient's history has been reviewed, patient examined, no change in status, stable for surgery.  I have reviewed the patient's chart and labs.  Questions were answered to the patient's satisfaction.    Thomasene Mohair, MD, [facility] 08/18/2023 2:39 PM

## 2023-08-18 NOTE — Anesthesia Procedure Notes (Signed)
Procedure Name: Intubation Date/Time: 08/18/2023 3:50 PM  Performed by: Nelle Don, CRNAPre-anesthesia Checklist: Patient identified, Emergency Drugs available, Suction available and Patient being monitored Patient Re-evaluated:Patient Re-evaluated prior to induction Oxygen Delivery Method: Circle system utilized Preoxygenation: Pre-oxygenation with 100% oxygen Induction Type: IV induction Ventilation: Mask ventilation without difficulty Laryngoscope Size: McGrath and 4 Grade View: Grade I Tube type: Oral Tube size: 7.0 mm Number of attempts: 1 Airway Equipment and Method: Stylet and Video-laryngoscopy Placement Confirmation: ETT inserted through vocal cords under direct vision, positive ETCO2 and breath sounds checked- equal and bilateral Secured at: 22 cm Tube secured with: Tape Dental Injury: Teeth and Oropharynx as per pre-operative assessment  Comments: Elective mcgrath

## 2023-08-18 NOTE — Transfer of Care (Signed)
Immediate Anesthesia Transfer of Care Note  Patient: Autumn Davies  Procedure(s) Performed: XI ROBOT ASSISTED DIAGNOSTIC LAPAROSCOPY WITH EXCISION OF ENDOMETRIOSIS (Abdomen)  Patient Location: PACU  Anesthesia Type:General  Level of Consciousness: awake, drowsy, and patient cooperative  Airway & Oxygen Therapy: Patient Spontanous Breathing and Patient connected to face mask oxygen  Post-op Assessment: Report given to RN and Post -op Vital signs reviewed and stable  Post vital signs: Reviewed and stable  Last Vitals:  Vitals Value Taken Time  BP 102/63 08/18/23 1732  Temp    Pulse 83 08/18/23 1734  Resp 16 08/18/23 1734  SpO2 100 % 08/18/23 1734  Vitals shown include unfiled device data.  Last Pain:  Vitals:   08/18/23 1432  TempSrc: Tympanic  PainSc: 0-No pain         Complications: No notable events documented.

## 2023-08-18 NOTE — Op Note (Signed)
Operative Note    Name: Autumn Davies  Date of Service: 08/18/2023  DOB: 10/17/1990  MRN: [id number]   Pre-Operative Diagnosis: Chronic pelvic pain in female   Post-Operative Diagnosis:  1) Chronic pelvic pain in female 2) Endometriosis   Procedures:  1) Robot assisted diagnostic laparoscopy 2) Excision of endometriosis implants  Primary Surgeon: Thomasene Mohair, MD   Assistant Surgeon: Christeen Douglas, MD - No other capable assistant available, in surgery requiring high level assistant.  EBL: 3 mL   IVF: 400 mL   Urine output: 150 mL clear urine  Specimens:  1) Right pelvic brim peritoneum 2) Bladder peritoneum 3) Left ovarian fossa peritoneum 4) Posterior cul-de-sac peritoneum  Drains: none  Complications: None   Disposition: PACU   Condition: Stable   Findings:  1) Appendix adherent to right pelvic brim with erythematous area suspicious for endometriosis 2) Lesions over bladder consistent with endometriosis 3) Lesions over left ovarian fossa consistent with endometriosis 4) Lesions over posterior cul-de-sac consistent with endometriosis 5) lesions noted along the fallopian tubes, consistent with endosalpingiosis.  Procedure Summary:  The patient was taken to the operating room where general anesthesia was administered and found to be adequate. She was placed in the dorsal supine lithotomy position in [place] stirrups and prepped and draped in usual sterile fashion. After a timeout was called an indwelling catheter was placed in her bladder.  A ZUMI uterine manipulator was placed in accordance with the manufacturer's recommendation without difficulty.   Attention was turned to the abdomen where after injection of local anesthetic, an 8 mm infraumbilical incision was made with the scalpel. Entry into the abdomen was obtained via Optiview trocar technique (a blunt entry technique with camera visualization through the obturator upon entry). Verification of  entry into the abdomen was obtained using opening pressures. The abdomen was insufflated with CO2. The camera was introduced through the trocar with verification of atraumatic entry.  Right and left abdominal entry sites were created after injection of local anesthetic about 8 cm away from the umbilical port in accordance with the Intuitive manufacturer's recommendations.  The port sites were 8 mm.  The intuitive trochars were introduced under intra-abdominal camera visualization without difficulty.  The XI robot was docked on the patient's left.  Clearance was verified from the patient's legs.  Through the umbilical port the camera was placed.  Through the port attached to arm 4 the monopolar scissors were placed.  Through the port attached to arm 2 the forced bipolar forceps were was placed.    A survey of the pelvis was undertaken, with the above-noted findings.  Given that the patient reported more findings on her right, the adhesions of the appendix were taken down, due to the appearance of the area where the main adhesion connected to the peritoneum.  The peritoneal attachment was well away from the appendix.  This attachment was elevated and, in a circumferential fashion, the peritoneum was scored, undermined and separated.  Hemostasis was noted.  The specimen was passed off through the arm 4.  Attention was turned to the bladder.  A similar procedure was carried out to remove a large area of vesicular lesions that appeared to be consistent with endometriosis.  Special care was taken to elevate the peritoneum prior to any scoring or use of electrocautery.  The entire peritoneum was removed without difficulty with the underlying tissue showing no signs of char.  Similarly, the specimen was passed from the body cavity.  Attention was turned to the  left ovarian fossa.  After verifying the location of the ureter, the tissue was scored in a circumferential fashion around what appeared to be the early  development of a Allen-Masters window.  The peritoneum was entered andaway from the ureter.  This was developed following the score line until the peritoneum was removed and the ureter was verified to be away from any of the cauterized areas.  Hemostasis was noted.  The specimen was passed out of the body cavity without difficulty.  In the posterior cul-de-sac and area was noted distal and anterior to the rectum between the vagina and rectum.  Again a circumferential score was made around the tissue of interest.  And the lateral area the peritoneum was entered.  The underlying tissue was dissected away from the peritoneum as it was transected with the scissors until the entire area was removed.  Hemostasis was noted.  The specimen was passed out of the body cavity.  Hemostasis was noted at all sites.  No other biopsies were performed.  Interceed was placed over all biopsy sites.  The pressure in the abdomen was lowered to 5 mmHg and hemostasis was again noted.    All instruments were removed and the robot was undocked from the patient and moved from the bedside.  The abdomen was emptied of CO2 with the aid of 5 deep breaths from anesthesia.  All trocars were removed after placing a soft plunger device down the shaft of the trocars to reduce the risk of a herniation of the bowel through the peritoneum.  All skin sites were closed using 4-0 Monocryl and reinforced using surgical skin glue.  The catheter was removed and the ZUMI uterine manipulator was removed.  Hemostasis was noted.  The assistant helped in creating port sites and placing trocars.  Additionally, she performed uterine manipulation.  She also helped and removal of the trocars, the catheter, and the uterine manipulator.  She also assisted in closing skin incisions.  The patient tolerated the procedure well.  Sponge, lap, needle, and instrument counts were correct x 2.  VTE prophylaxis: SCDs. Antibiotic prophylaxis: none indicated. She was  awakened in the operating room and was taken to the PACU in stable condition. The assistant surgeon was an MD due to lack of availability of another Sales promotion account executive.   Thomasene Mohair, MD 08/18/2023 5:21 PM

## 2023-08-18 NOTE — Anesthesia Preprocedure Evaluation (Addendum)
Anesthesia Evaluation  Patient identified by MRN, date of birth, ID band Patient awake    Reviewed: Allergy & Precautions, NPO status , Patient's Chart, lab work & pertinent test results  History of Anesthesia Complications Negative for: history of anesthetic complications  Airway Mallampati: III  TM Distance: >3 FB Neck ROM: full    Dental  (+) Dental Advidsory Given, Teeth Intact   Pulmonary neg shortness of breath, neg sleep apnea, neg COPD, neg recent URI, former smoker   Pulmonary exam normal        Cardiovascular negative cardio ROS Normal cardiovascular exam     Neuro/Psych  Headaches, neg Seizures PSYCHIATRIC DISORDERS Anxiety Depression     Neuromuscular disease (Charcot Marie)    GI/Hepatic negative GI ROS, Neg liver ROS,,,  Endo/Other  negative endocrine ROS    Renal/GU      Musculoskeletal   Abdominal   Peds  Hematology negative hematology ROS (+)   Anesthesia Other Findings Past Medical History: No date: Anxiety 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     Comment:  AFFECTS MOSTLY HER LEGS-CAUSES CRAMPS AND RESTLESS               LEG-TAKES GABAPENTIN AT NIGHT WHICH SEEMS TO HELP No date: Endometriosis determined by laparoscopy No date: Irritable bowel syndrome No date: Major depressive disorder No date: Migraines     Comment:  no aura No date: Preeclampsia 04/01/2021: S/P cesarean section  Past Surgical History: 03/25/2021: CESAREAN SECTION     Comment:  Procedure: CESAREAN SECTION;  Surgeon: Nadara Mustard,              MD;  Location: [facility] ORS;  Service: Obstetrics;; 2023: CHOLECYSTECTOMY 12/25/2015: LAPAROSCOPY; N/A     Comment:  Procedure: LAPAROSCOPY DIAGNOSTIC;  Surgeon: Conard Novak, MD;  Location: [facility] ORS;  Service: Gynecology;                Laterality: N/A; 03/18/2020: LEEP; N/A     Comment:  Procedure: LOOP ELECTROSURGICAL EXCISION PROCEDURE               (LEEP);   Surgeon: Conard Novak, MD;  Location: [facility]              ORS;  Service: Gynecology;  Laterality: N/A; No date: WISDOM TOOTH EXTRACTION  BMI    Body Mass Index: 32.61 kg/m      Reproductive/Obstetrics negative OB ROS                             Anesthesia Physical Anesthesia Plan  ASA: 2  Anesthesia Plan: General   Post-op Pain Management: Toradol IV (intra-op)*, Ofirmev IV (intra-op)*, Ketamine IV* and Precedex   Induction: Intravenous  PONV Risk Score and Plan: 3 and Ondansetron, Dexamethasone, Midazolam and Treatment may vary due to age or medical condition  Airway Management Planned: Oral ETT  Additional Equipment:   Intra-op Plan:   Post-operative Plan: Extubation in OR  Informed Consent: I have reviewed the patients History and Physical, chart, labs and discussed the procedure including the risks, benefits and alternatives for the proposed anesthesia with the patient or authorized representative who has indicated his/her understanding and acceptance.     Dental Advisory Given  Plan Discussed with: Anesthesiologist, CRNA and Surgeon  Anesthesia Plan Comments: (Patient consented for risks of anesthesia including but not limited to:  - adverse  reactions to medications - damage to eyes, teeth, lips or other oral mucosa - nerve damage due to positioning  - sore throat or hoarseness - Damage to heart, brain, nerves, lungs, other parts of body or loss of life  Patient voiced understanding and assent.)       Anesthesia Quick Evaluation

## 2023-08-18 NOTE — Anesthesia Postprocedure Evaluation (Signed)
Anesthesia Post Note  Patient: Autumn Davies  Procedure(s) Performed: XI ROBOT ASSISTED DIAGNOSTIC LAPAROSCOPY WITH EXCISION OF ENDOMETRIOSIS (Abdomen)  Patient location during evaluation: PACU Anesthesia Type: General Level of consciousness: awake and alert Pain management: pain level controlled Vital Signs Assessment: post-procedure vital signs reviewed and stable Respiratory status: spontaneous breathing, nonlabored ventilation, respiratory function stable and patient connected to nasal cannula oxygen Cardiovascular status: blood pressure returned to baseline and stable Postop Assessment: no apparent nausea or vomiting Anesthetic complications: no   No notable events documented.   Last Vitals:  Vitals:   08/18/23 1826 08/18/23 1834  BP: 107/73 103/72  Pulse: 68 76  Resp: 11 16  Temp: 36.9 C 36.9 C  SpO2: 95% 96%    Last Pain:  Vitals:   08/18/23 1834  TempSrc: Temporal  PainSc: 3                  Lenard Simmer

## 2023-08-19 ENCOUNTER — Encounter: Payer: Self-pay | Admitting: Obstetrics and Gynecology

## 2023-08-23 LAB — SURGICAL PATHOLOGY
